# Patient Record
Sex: Female | Born: 1935
Health system: Southern US, Community
[De-identification: ages and names within clinical notes are randomized; demographics above are authoritative.]

## PROBLEM LIST (undated history)

## (undated) DIAGNOSIS — Z8489 Family history of other specified conditions: Secondary | ICD-10-CM

## (undated) DIAGNOSIS — K219 Gastro-esophageal reflux disease without esophagitis: Secondary | ICD-10-CM

## (undated) DIAGNOSIS — C50919 Malignant neoplasm of unspecified site of unspecified female breast: Secondary | ICD-10-CM

## (undated) DIAGNOSIS — Z923 Personal history of irradiation: Secondary | ICD-10-CM

## (undated) DIAGNOSIS — Z9221 Personal history of antineoplastic chemotherapy: Secondary | ICD-10-CM

## (undated) DIAGNOSIS — R011 Cardiac murmur, unspecified: Secondary | ICD-10-CM

## (undated) DIAGNOSIS — M5416 Radiculopathy, lumbar region: Secondary | ICD-10-CM

## (undated) DIAGNOSIS — M1990 Unspecified osteoarthritis, unspecified site: Secondary | ICD-10-CM

## (undated) DIAGNOSIS — M48061 Spinal stenosis, lumbar region without neurogenic claudication: Secondary | ICD-10-CM

## (undated) HISTORY — PX: BACK SURGERY: SHX140

## (undated) HISTORY — PX: BREAST SURGERY: SHX581

## (undated) HISTORY — PX: FRACTURE SURGERY: SHX138

## (undated) HISTORY — PX: EYE SURGERY: SHX253

## (undated) HISTORY — PX: ABDOMINAL HYSTERECTOMY: SHX81

## (undated) HISTORY — PX: COLONOSCOPY: SHX174

---

## 1999-12-16 HISTORY — PX: TUMOR EXCISION: SHX421

## 2005-04-17 ENCOUNTER — Ambulatory Visit: Payer: Self-pay | Admitting: Family Medicine

## 2005-12-15 DIAGNOSIS — Z923 Personal history of irradiation: Secondary | ICD-10-CM

## 2005-12-15 DIAGNOSIS — C50919 Malignant neoplasm of unspecified site of unspecified female breast: Secondary | ICD-10-CM

## 2005-12-15 HISTORY — DX: Malignant neoplasm of unspecified site of unspecified female breast: C50.919

## 2005-12-15 HISTORY — PX: BREAST LUMPECTOMY: SHX2

## 2005-12-15 HISTORY — DX: Personal history of irradiation: Z92.3

## 2006-04-22 ENCOUNTER — Ambulatory Visit: Payer: Self-pay | Admitting: Family Medicine

## 2006-04-24 ENCOUNTER — Ambulatory Visit: Payer: Self-pay | Admitting: Family Medicine

## 2006-06-15 ENCOUNTER — Other Ambulatory Visit: Payer: Self-pay

## 2006-06-15 ENCOUNTER — Ambulatory Visit: Payer: Self-pay | Admitting: General Surgery

## 2006-06-18 ENCOUNTER — Ambulatory Visit: Payer: Self-pay | Admitting: General Surgery

## 2006-07-02 ENCOUNTER — Ambulatory Visit: Payer: Self-pay | Admitting: General Surgery

## 2006-07-07 ENCOUNTER — Ambulatory Visit: Payer: Self-pay | Admitting: General Surgery

## 2006-07-27 ENCOUNTER — Ambulatory Visit: Payer: Self-pay | Admitting: Oncology

## 2006-08-21 ENCOUNTER — Ambulatory Visit: Payer: Self-pay | Admitting: Oncology

## 2006-09-14 ENCOUNTER — Ambulatory Visit: Payer: Self-pay | Admitting: Oncology

## 2006-12-23 ENCOUNTER — Ambulatory Visit: Payer: Self-pay | Admitting: General Surgery

## 2007-02-22 ENCOUNTER — Ambulatory Visit: Payer: Self-pay | Admitting: Oncology

## 2007-03-16 ENCOUNTER — Ambulatory Visit: Payer: Self-pay | Admitting: Oncology

## 2007-06-16 ENCOUNTER — Ambulatory Visit: Payer: Self-pay | Admitting: General Surgery

## 2007-08-16 ENCOUNTER — Ambulatory Visit: Payer: Self-pay | Admitting: Oncology

## 2007-08-30 ENCOUNTER — Ambulatory Visit: Payer: Self-pay | Admitting: Oncology

## 2007-09-15 ENCOUNTER — Ambulatory Visit: Payer: Self-pay | Admitting: Oncology

## 2007-10-16 ENCOUNTER — Ambulatory Visit: Payer: Self-pay | Admitting: Oncology

## 2007-11-09 ENCOUNTER — Emergency Department: Payer: Self-pay | Admitting: Unknown Physician Specialty

## 2007-12-16 ENCOUNTER — Ambulatory Visit: Payer: Self-pay | Admitting: Oncology

## 2007-12-22 ENCOUNTER — Ambulatory Visit: Payer: Self-pay | Admitting: General Surgery

## 2008-01-06 ENCOUNTER — Ambulatory Visit: Payer: Self-pay | Admitting: Urology

## 2008-02-13 ENCOUNTER — Ambulatory Visit: Payer: Self-pay | Admitting: Oncology

## 2008-03-15 ENCOUNTER — Ambulatory Visit: Payer: Self-pay | Admitting: Oncology

## 2008-05-15 ENCOUNTER — Ambulatory Visit: Payer: Self-pay | Admitting: Oncology

## 2008-06-14 ENCOUNTER — Ambulatory Visit: Payer: Self-pay | Admitting: General Surgery

## 2008-06-14 ENCOUNTER — Ambulatory Visit: Payer: Self-pay | Admitting: Oncology

## 2008-08-15 ENCOUNTER — Ambulatory Visit: Payer: Self-pay | Admitting: Oncology

## 2008-08-29 ENCOUNTER — Ambulatory Visit: Payer: Self-pay | Admitting: Oncology

## 2008-09-14 ENCOUNTER — Ambulatory Visit: Payer: Self-pay | Admitting: Oncology

## 2008-12-18 ENCOUNTER — Ambulatory Visit: Payer: Self-pay | Admitting: General Surgery

## 2009-06-20 ENCOUNTER — Ambulatory Visit: Payer: Self-pay | Admitting: General Surgery

## 2009-10-31 ENCOUNTER — Ambulatory Visit: Payer: Self-pay | Admitting: Ophthalmology

## 2010-02-26 ENCOUNTER — Ambulatory Visit: Payer: Self-pay | Admitting: Gastroenterology

## 2010-06-26 ENCOUNTER — Ambulatory Visit: Payer: Self-pay | Admitting: General Surgery

## 2011-07-02 ENCOUNTER — Ambulatory Visit: Payer: Self-pay | Admitting: General Surgery

## 2012-07-06 ENCOUNTER — Other Ambulatory Visit: Payer: Self-pay | Admitting: Neurosurgery

## 2012-07-06 DIAGNOSIS — M47816 Spondylosis without myelopathy or radiculopathy, lumbar region: Secondary | ICD-10-CM

## 2012-07-07 ENCOUNTER — Ambulatory Visit
Admission: RE | Admit: 2012-07-07 | Discharge: 2012-07-07 | Disposition: A | Discharge status: Home / Self Care | Payer: Commercial Managed Care - PPO | Source: Ambulatory Visit | Attending: Neurosurgery | Admitting: Neurosurgery

## 2012-07-07 DIAGNOSIS — M47816 Spondylosis without myelopathy or radiculopathy, lumbar region: Secondary | ICD-10-CM

## 2012-07-07 MED ORDER — GADOBENATE DIMEGLUMINE 529 MG/ML IV SOLN
17.0000 mL | Freq: Once | INTRAVENOUS | Status: AC | PRN
  Administered 2012-07-07: 17 mL via INTRAVENOUS

## 2012-09-15 ENCOUNTER — Ambulatory Visit: Payer: Self-pay | Admitting: Ophthalmology

## 2012-10-27 ENCOUNTER — Ambulatory Visit: Payer: Self-pay | Admitting: Family Medicine

## 2013-06-21 ENCOUNTER — Ambulatory Visit: Payer: Self-pay | Admitting: Family Medicine

## 2013-10-26 ENCOUNTER — Ambulatory Visit: Payer: Self-pay | Admitting: Orthopedic Surgery

## 2013-10-27 ENCOUNTER — Ambulatory Visit: Payer: Self-pay | Admitting: Orthopedic Surgery

## 2013-11-29 ENCOUNTER — Ambulatory Visit: Payer: Self-pay | Admitting: Family Medicine

## 2013-12-01 ENCOUNTER — Ambulatory Visit: Payer: Self-pay | Admitting: Family Medicine

## 2015-01-17 ENCOUNTER — Ambulatory Visit: Payer: Self-pay | Admitting: Orthopedic Surgery

## 2015-01-30 DIAGNOSIS — M48062 Spinal stenosis, lumbar region with neurogenic claudication: Secondary | ICD-10-CM | POA: Insufficient documentation

## 2015-02-21 ENCOUNTER — Ambulatory Visit: Payer: Self-pay | Admitting: Family Medicine

## 2015-02-23 ENCOUNTER — Ambulatory Visit: Payer: Self-pay | Admitting: Family Medicine

## 2015-04-06 NOTE — Op Note (Signed)
PATIENT NAME:  Megan Santana, Megan Santana MR#:  [id number] DATE OF BIRTH:  04-Jan-1934  DATE OF PROCEDURE:  10/27/2013  PREOPERATIVE DIAGNOSIS: Left distal radius fracture, comminuted, displaced.   POSTOPERATIVE DIAGNOSIS: Left distal radius fracture, comminuted, displaced.   PROCEDURE: Open reduction and internal fixation, left distal radius.   ANESTHESIA: General.   SURGEON: Hessie Knows, M.D.   DESCRIPTION OF PROCEDURE: The patient was brought to the Operating Room and, after adequate anesthesia was obtained, the left arm was prepped and draped in the usual sterile fashion. A tourniquet applied to the upper arm. After appropriate patient identification and timeout procedures were completed, the tourniquet was raised to 250 mmHg and finger trap traction applied with approximately 10 pounds of traction.   An incision was made over the FCR tendon. The FCR tendon sheath was incised, and the tendon retracted radially, with the radial artery protected. The deep fascia was then incised and the pronator elevated off the distal fragment and distal shaft. With traction, fairly good alignment was obtained; with some ulnar deviation and volar flexion, near-anatomic alignment was obtained. A DVR standard short plate was then applied to the radius, with three proximal screw holes placed after it was adjusted appropriately. Distal peg holes were filled using standard technique, drilling, measuring and placing the smooth pegs. Care was taken under fluoroscopy to make certain that there was no penetration into the joint.   After all peg holes had been filled, the traction was released. The wrist was placed through a range of motion, and there was stable fixation, with restoration of length, radial inclination and some volar tilt. The wound was irrigated and tourniquet let down. The wound was closed with 3-0 Vicryl subcutaneously and 4-0 nylon for the skin. Xeroform, 4 x 4's, Webril and a volar splint were applied, followed  by an Ace wrap.   TOURNIQUET TIME: 33 minutes at 250 mmHg.  COMPLICATIONS: There were no complications.   SPECIMEN: No specimen.   IMPLANT: [employer] short standard left DVR plate.    ____________________________ Laurene Footman, MD mjm:cg D: 10/27/2013 18:50:04 ET T: 10/28/2013 03:41:26 ET JOB#: [id number]  cc: Laurene Footman, MD, <Dictator> Laurene Footman MD ELECTRONICALLY SIGNED 10/28/2013 8:01

## 2016-12-15 DIAGNOSIS — Z9221 Personal history of antineoplastic chemotherapy: Secondary | ICD-10-CM

## 2016-12-15 HISTORY — PX: BREAST EXCISIONAL BIOPSY: SUR124

## 2016-12-15 HISTORY — DX: Personal history of antineoplastic chemotherapy: Z92.21

## 2017-01-04 ENCOUNTER — Encounter: Payer: Self-pay | Admitting: Gynecology

## 2017-01-04 ENCOUNTER — Ambulatory Visit (INDEPENDENT_AMBULATORY_CARE_PROVIDER_SITE_OTHER): Payer: Medicare PPO

## 2017-01-04 ENCOUNTER — Ambulatory Visit
Admission: EM | Admit: 2017-01-04 | Discharge: 2017-01-04 | Disposition: A | Discharge status: Home / Self Care | Payer: Medicare PPO | Attending: Family Medicine | Admitting: Family Medicine

## 2017-01-04 DIAGNOSIS — S40011A Contusion of right shoulder, initial encounter: Secondary | ICD-10-CM

## 2017-01-04 DIAGNOSIS — Y92099 Unspecified place in other non-institutional residence as the place of occurrence of the external cause: Secondary | ICD-10-CM

## 2017-01-04 DIAGNOSIS — Y92009 Unspecified place in unspecified non-institutional (private) residence as the place of occurrence of the external cause: Principal | ICD-10-CM

## 2017-01-04 DIAGNOSIS — S40012A Contusion of left shoulder, initial encounter: Secondary | ICD-10-CM | POA: Diagnosis not present

## 2017-01-04 DIAGNOSIS — S40022A Contusion of left upper arm, initial encounter: Secondary | ICD-10-CM | POA: Diagnosis not present

## 2017-01-04 DIAGNOSIS — W19XXXA Unspecified fall, initial encounter: Secondary | ICD-10-CM | POA: Diagnosis not present

## 2017-01-04 DIAGNOSIS — S40021A Contusion of right upper arm, initial encounter: Secondary | ICD-10-CM | POA: Diagnosis not present

## 2017-01-04 HISTORY — DX: Spinal stenosis, lumbar region without neurogenic claudication: M48.061

## 2017-01-04 HISTORY — DX: Radiculopathy, lumbar region: M54.16

## 2017-01-04 MED ORDER — KETOROLAC TROMETHAMINE 60 MG/2ML IM SOLN
30.0000 mg | Freq: Once | INTRAMUSCULAR | Status: AC
  Administered 2017-01-04: 30 mg via INTRAMUSCULAR

## 2017-01-04 MED ORDER — MELOXICAM 7.5 MG PO TABS: 7.5000 mg | ORAL_TABLET | Freq: Every day | ORAL | 0 refills | Status: DC

## 2017-01-04 MED ORDER — OXYCODONE-ACETAMINOPHEN 5-325 MG PO TABS: 1.0000 | ORAL_TABLET | Freq: Three times a day (TID) | ORAL | 0 refills | Status: DC | PRN

## 2017-01-04 NOTE — ED Triage Notes (Signed)
Per patient slipped on ice this morning and fell and injury right arm/shoulder.

## 2017-01-04 NOTE — ED Provider Notes (Signed)
[facility]    CSN: [id number] Arrival date & time: 01/04/17  1339     History   Chief Complaint Chief Complaint  Patient presents with  . Fall    HPI Megan Santana is a 83 y.o. female.   Patient's here because of fall. She states she was unaware to church when she stopped the get her newspaper that was at the top of her driveway. She states that she thought she was being very careful still want up falling and landing on her right shoulder right elbow. She reports excruciating pain in the right shoulder and elbow she is unable to move the arm and elbow unable to raise above her head. Even during examination she reports extreme amount of pain and discomfort and inability to really follow my instructions. Should be noted that she had a fracture of the left wrist about 3-4 years ago when she got on a ladder and fell fracturing her left wrist. She denies any significant medical problems. She's had back surgery breast lumpectomy breast surgery and eye surgery but no chronic medical problems. She has no known allergies and no family medical history pertinent to today's visit   The history is provided by the patient. No language interpreter was used.  Fall  This is a new problem. The current episode started 3 to 5 hours ago. The problem has been gradually worsening. Pertinent negatives include no chest pain, no abdominal pain, no headaches and no shortness of breath. Nothing aggravates the symptoms. Nothing relieves the symptoms. She has tried nothing for the symptoms. The treatment provided no relief.    Past Medical History:  Diagnosis Date  . Lumbar radiculitis   . Lumbar stenosis     There are no active problems to display for this patient.   Past Surgical History:  Procedure Laterality Date  . BACK SURGERY    . BREAST LUMPECTOMY    . BREAST SURGERY Right    masectomy  . EYE SURGERY      OB History    No data available       Home Medications     Prior to Admission medications   Medication Sig Start Date End Date Taking? Authorizing Provider  aspirin EC 81 MG tablet Take 81 mg by mouth daily.   Yes Historical Provider, MD  magnesium citrate SOLN Take 1 Bottle by mouth once.   Yes Historical Provider, MD  Multiple Vitamin (MULTIVITAMIN) tablet Take 1 tablet by mouth daily.   Yes Historical Provider, MD  Potassium 99 MG TABS Take by mouth.   Yes Historical Provider, MD  vitamin C (ASCORBIC ACID) 500 MG tablet Take 500 mg by mouth daily.   Yes Historical Provider, MD  meloxicam (MOBIC) 7.5 MG tablet Take 1 tablet (7.5 mg total) by mouth daily. 01/04/17   Frederich Cha, MD  oxyCODONE-acetaminophen (ROXICET) 5-325 MG tablet Take 1 tablet by mouth every 8 (eight) hours as needed for severe pain. 01/04/17   Frederich Cha, MD    Family History No family history on file.  Social History Social History  Substance Use Topics  . Smoking status: Never Smoker  . Smokeless tobacco: Never Used  . Alcohol use No     Allergies   Patient has no known allergies.   Review of Systems Review of Systems  Respiratory: Negative for shortness of breath.   Cardiovascular: Negative for chest pain.  Gastrointestinal: Negative for abdominal pain.  Musculoskeletal: Positive for joint swelling and myalgias.  Neurological:  Negative for headaches.  All other systems reviewed and are negative.    Physical Exam Triage Vital Signs ED Triage Vitals  Enc Vitals Group     BP 01/04/17 1433 (!) 153/76     Pulse Rate 01/04/17 1433 80     Resp 01/04/17 1433 16     Temp 01/04/17 1433 98.4 F (36.9 C)     Temp Source 01/04/17 1433 Oral     SpO2 01/04/17 1433 99 %     Weight 01/04/17 1436 190 lb (86.2 kg)     Height 01/04/17 1436 6\' 3"  (1.905 m)     Head Circumference --      Peak Flow --      Pain Score 01/04/17 1446 8     Pain Loc --      Pain Edu? --      Excl. in [place]? --    No data found.   Updated Vital Signs BP (!) 153/76 (BP Location: Left  Arm)   Pulse 80   Temp 98.4 F (36.9 C) (Oral)   Resp 16   Ht 6\' 3"  (1.905 m)   Wt 190 lb (86.2 kg)   SpO2 99%   BMI 23.75 kg/m   Visual Acuity Right Eye Distance:   Left Eye Distance:   Bilateral Distance:    Right Eye Near:   Left Eye Near:    Bilateral Near:     Physical Exam  Constitutional: She is oriented to person, place, and time. She appears well-developed and well-nourished.  HENT:  Head: Normocephalic and atraumatic.  Eyes: Pupils are equal, round, and reactive to light.  Neck: Normal range of motion. Neck supple.  Pulmonary/Chest: Effort normal.  Musculoskeletal: She exhibits tenderness.       Right shoulder: She exhibits decreased range of motion, tenderness and swelling.       Right elbow: She exhibits decreased range of motion. Tenderness found.  Should be noted the patient examination was limited she is unable to really do any good range of motion of the right arm raised above her head to the inverted beer can test or anything of that level. Any touching of the arm causes marked amount discomfort and pain by her.  Neurological: She is alert and oriented to person, place, and time. No cranial nerve deficit.  Skin: Skin is warm.  Psychiatric: She has a normal mood and affect.  Vitals reviewed.    UC Treatments / Results  Labs (all labs ordered are listed, but only abnormal results are displayed) Labs Reviewed - No data to display  EKG  EKG Interpretation None       Radiology Dg Shoulder Right  Result Date: 01/04/2017 CLINICAL DATA:  Right shoulder and right elbow pain after a fall on ice today. Limited range of motion. Initial encounter. EXAM: RIGHT SHOULDER - 2+ VIEW COMPARISON:  None. FINDINGS: There is no evidence of fracture or dislocation. There is no evidence of arthropathy or other focal bone abnormality. Soft tissues are unremarkable. IMPRESSION: Negative. Electronically Signed   By: Logan Bores M.D.   On: 01/04/2017 16:37   Dg Elbow  Complete Right  Result Date: 01/04/2017 CLINICAL DATA:  Right shoulder and elbow pain after a fall on ice today. Initial encounter. EXAM: RIGHT ELBOW - COMPLETE 3+ VIEW COMPARISON:  None. FINDINGS: There is no evidence of acute fracture, dislocation, or elbow joint effusion. Apparent mild cortical step-off involving the posterior margin of the olecranon on a single oblique image is felt  to be projectional, not confirmed on other images. A small well corticated ossicle is noted adjacent to the coronoid process. No focal osseous lesion or soft tissue abnormality is seen. IMPRESSION: No evidence of acute osseous abnormality. Electronically Signed   By: Logan Bores M.D.   On: 01/04/2017 16:43    Procedures Procedures (including critical care time)  Medications Ordered in UC Medications  ketorolac (TORADOL) injection 30 mg (30 mg Intramuscular Given 01/04/17 1702)     Initial Impression / Assessment and Plan / UC Course  I have reviewed the triage vital signs and the nursing notes.  Pertinent labs & imaging results that were available during my care of the patient were reviewed by me and considered in my medical decision making (see chart for details).    Patient was informed that both x-rays were negative of the elbow on the shoulder. Recommend sling she declined one here because she states she had one at home but recommended that she use a sling and ice for about 48 hours and instructed using the shoulder to prevent it from getting frozen. Because of mild pain and tenderness unable to do a a complete examination of the right shoulder 7 is not improving in a week she needs to see her PCP orthopedic of her choice. She be noted the patient had a left wrist fracture a couple years ago that required pinning. That time they used Percocet for pain we'll give her 5 days worth of Percocet No. 15 and placed on Mobic 7.5 mg 1 tablet a day. Because of mild pain she was in 30 mg of Toradol was administered here  as well.  Final Clinical Impressions(s) / UC Diagnoses   Final diagnoses:  Fall in home, initial encounter  Contusion of multiple sites of left shoulder and upper arm, initial encounter    New Prescriptions Discharge Medication List as of 01/04/2017  5:14 PM    START taking these medications   Details  meloxicam (MOBIC) 7.5 MG tablet Take 1 tablet (7.5 mg total) by mouth daily., Starting Sun 01/04/2017, Normal    oxyCODONE-acetaminophen (ROXICET) 5-325 MG tablet Take 1 tablet by mouth every 8 (eight) hours as needed for severe pain., Starting Sun 01/04/2017, Normal       Note: This dictation was prepared with Dragon dictation along with smaller phrase technology. Any transcriptional errors that result from this process are unintentional.    Frederich Cha, MD 01/04/17 [phone]

## 2017-01-22 ENCOUNTER — Other Ambulatory Visit: Payer: Self-pay | Admitting: Family Medicine

## 2017-01-22 DIAGNOSIS — Z1231 Encounter for screening mammogram for malignant neoplasm of breast: Secondary | ICD-10-CM

## 2017-03-04 ENCOUNTER — Other Ambulatory Visit: Payer: Self-pay | Admitting: Family Medicine

## 2017-03-04 ENCOUNTER — Ambulatory Visit
Admission: RE | Admit: 2017-03-04 | Discharge: 2017-03-04 | Disposition: A | Discharge status: Home / Self Care | Payer: Medicare PPO | Source: Ambulatory Visit | Attending: Family Medicine | Admitting: Family Medicine

## 2017-03-04 DIAGNOSIS — R928 Other abnormal and inconclusive findings on diagnostic imaging of breast: Secondary | ICD-10-CM | POA: Diagnosis not present

## 2017-03-04 DIAGNOSIS — N6320 Unspecified lump in the left breast, unspecified quadrant: Secondary | ICD-10-CM

## 2017-03-04 DIAGNOSIS — Z1231 Encounter for screening mammogram for malignant neoplasm of breast: Secondary | ICD-10-CM | POA: Diagnosis not present

## 2017-03-04 DIAGNOSIS — N6310 Unspecified lump in the right breast, unspecified quadrant: Secondary | ICD-10-CM

## 2017-03-04 HISTORY — DX: Malignant neoplasm of unspecified site of unspecified female breast: C50.919

## 2017-03-17 ENCOUNTER — Ambulatory Visit
Admission: RE | Admit: 2017-03-17 | Discharge: 2017-03-17 | Disposition: A | Discharge status: Home / Self Care | Payer: Medicare PPO | Source: Ambulatory Visit | Attending: Family Medicine | Admitting: Family Medicine

## 2017-03-17 DIAGNOSIS — N6313 Unspecified lump in the right breast, lower outer quadrant: Secondary | ICD-10-CM | POA: Insufficient documentation

## 2017-03-17 DIAGNOSIS — N6321 Unspecified lump in the left breast, upper outer quadrant: Secondary | ICD-10-CM | POA: Insufficient documentation

## 2017-03-17 DIAGNOSIS — N6320 Unspecified lump in the left breast, unspecified quadrant: Secondary | ICD-10-CM

## 2017-03-17 DIAGNOSIS — R928 Other abnormal and inconclusive findings on diagnostic imaging of breast: Secondary | ICD-10-CM | POA: Diagnosis present

## 2017-03-17 DIAGNOSIS — N6310 Unspecified lump in the right breast, unspecified quadrant: Secondary | ICD-10-CM

## 2017-03-18 ENCOUNTER — Other Ambulatory Visit: Payer: Self-pay | Admitting: Family Medicine

## 2017-03-18 DIAGNOSIS — N6320 Unspecified lump in the left breast, unspecified quadrant: Secondary | ICD-10-CM

## 2017-03-18 DIAGNOSIS — R928 Other abnormal and inconclusive findings on diagnostic imaging of breast: Secondary | ICD-10-CM

## 2017-03-19 ENCOUNTER — Other Ambulatory Visit: Payer: Self-pay | Admitting: Family Medicine

## 2017-03-19 DIAGNOSIS — R928 Other abnormal and inconclusive findings on diagnostic imaging of breast: Secondary | ICD-10-CM

## 2017-03-19 DIAGNOSIS — N6320 Unspecified lump in the left breast, unspecified quadrant: Secondary | ICD-10-CM

## 2017-03-19 DIAGNOSIS — N6310 Unspecified lump in the right breast, unspecified quadrant: Secondary | ICD-10-CM

## 2017-03-31 ENCOUNTER — Ambulatory Visit
Admission: RE | Admit: 2017-03-31 | Discharge: 2017-03-31 | Disposition: A | Discharge status: Home / Self Care | Payer: Medicare PPO | Source: Ambulatory Visit | Attending: Family Medicine | Admitting: Family Medicine

## 2017-03-31 DIAGNOSIS — N6310 Unspecified lump in the right breast, unspecified quadrant: Secondary | ICD-10-CM

## 2017-03-31 DIAGNOSIS — R928 Other abnormal and inconclusive findings on diagnostic imaging of breast: Secondary | ICD-10-CM

## 2017-03-31 DIAGNOSIS — N6031 Fibrosclerosis of right breast: Secondary | ICD-10-CM | POA: Insufficient documentation

## 2017-03-31 DIAGNOSIS — C50412 Malignant neoplasm of upper-outer quadrant of left female breast: Secondary | ICD-10-CM | POA: Insufficient documentation

## 2017-03-31 DIAGNOSIS — N6320 Unspecified lump in the left breast, unspecified quadrant: Secondary | ICD-10-CM

## 2017-03-31 HISTORY — PX: BREAST BIOPSY: SHX20

## 2017-04-07 ENCOUNTER — Encounter: Payer: Self-pay | Admitting: *Deleted

## 2017-04-07 NOTE — Progress Notes (Signed)
  Oncology Nurse Navigator Documentation  Navigator Location: [facility] (04/07/17 1000) Referral date to RadOnc/MedOnc: 04/10/17 (04/07/17 1000) )Navigator Encounter Type: Introductory phone call (04/07/17 1000)   Abnormal Finding Date: 03/17/17 (04/07/17 1000) Confirmed Diagnosis Date: 04/01/17 (04/07/17 1000)                   Barriers/Navigation Needs: Education;Coordination of Care (04/07/17 1000) Education: Understanding Cancer/ Treatment Options (04/07/17 1000) Interventions: Coordination of Care (04/07/17 1000)   Coordination of Care: Appts (04/07/17 1000)        Acuity: Level 2 (04/07/17 1000)    Called and spoke with patient yesterday and today.  She had been notified by the breast center of her results yesterday afternoon.  She had right breast cancer 11 years ago.  States she would like to see Dr. Bary Castilla or Dr Adonis Huguenin for surgical consult, whom ever could get her in the fastest.  She also saw Dr. Grayland Ormond in the past.  I have scheduled her to see Dr. Grayland Ormond for oncology consult on Friday, 04/10/17 @ 8:30 and Dr. Bary Castilla for surgical consult on Apr 16, 2017 @ 11:30.  She is to arrive 30 minutes early to complete paperwork.  She is to take a photo ID, all her meds , and her co-pay with her to the appointment.  She is interested in getting more educational literature.  Will take educational literature to her on Friday.  She is to call if she has any questions or needs.     Time Spent with Patient: 60 (04/07/17 1000)

## 2017-04-08 DIAGNOSIS — C50412 Malignant neoplasm of upper-outer quadrant of left female breast: Secondary | ICD-10-CM | POA: Insufficient documentation

## 2017-04-08 DIAGNOSIS — Z171 Estrogen receptor negative status [ER-]: Secondary | ICD-10-CM

## 2017-04-08 LAB — SURGICAL PATHOLOGY

## 2017-04-08 NOTE — Progress Notes (Signed)
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  INTERVAL HISTORY: Patient is an 83 year old female who was last evaluated in clinic in September 2009. She has a history of stage IA ER/PR positive right breast cancer. Recently she had an abnormal mammogram and biopsy which revealed the above stated triple negative breast cancer. She currently feels well and is asymptomatic. She has no neurologic complaints. She denies any recent fevers or illnesses. She has good appetite and denies weight loss. She has no chest pain or shortness of breath. She denies any nausea, vomiting, constipation, or diarrhea. She has no urinary complaints. Patient offers no specific complaints today.  REVIEW OF SYSTEMS:   Review of Systems  Constitutional: Negative.  Negative for fever, malaise/fatigue and weight loss.  Respiratory: Negative.  Negative for cough and shortness of breath.   Cardiovascular: Negative.  Negative for chest pain and leg swelling.  Gastrointestinal: Negative.  Negative for abdominal pain.  Genitourinary: Negative.   Musculoskeletal: Negative.   Skin: Negative.  Negative for rash.  Neurological: Negative.  Negative for sensory change and weakness.  Psychiatric/Behavioral: The patient is nervous/anxious.     As per HPI. Otherwise, a complete review of systems is negative.  PAST MEDICAL HISTORY: Past Medical History:  Diagnosis Date  . Breast cancer [facility]) 2007   right lumpectomy  . Lumbar radiculitis   . Lumbar stenosis     PAST SURGICAL HISTORY: Past Surgical History:  Procedure Laterality Date  . ABDOMINAL HYSTERECTOMY     total  . BACK SURGERY    . BREAST BIOPSY Bilateral 03/31/2017   bilat u/s core path  pending  . BREAST LUMPECTOMY Right 2007  . BREAST SURGERY Right    masectomy  . EYE SURGERY      FAMILY HISTORY: Family History  Problem Relation Age of Onset  . Breast cancer Sister 55  . Breast cancer Mother   . Lung cancer Mother   . Lung cancer Father   . Heart attack Brother     ADVANCED DIRECTIVES (Y/N):  N  HEALTH MAINTENANCE: Social History  Substance Use Topics  . Smoking status: Never Smoker  . Smokeless tobacco: Never Used  . Alcohol use No     Colonoscopy:  PAP:  Bone density:  Lipid panel:  Allergies  Allergen Reactions  . Oxycodone Other (See Comments)    Can't sleep, feels bad when taking.    Current Outpatient Prescriptions  Medication Sig Dispense Refill  . aspirin EC 81 MG tablet Take 81 mg by mouth daily.    . Calcium 600-200 MG-UNIT tablet Take 1 tablet by mouth daily.    . Cholecalciferol (VITAMIN D3 PO) Take by mouth.    . magnesium citrate SOLN Take 1 Bottle by mouth once.    . Multiple Vitamin (MULTIVITAMIN) tablet Take 1 tablet by mouth daily.    . Potassium 99 MG TABS Take by mouth.    . vitamin C (ASCORBIC ACID) 500 MG tablet Take 500 mg by mouth daily.    . meloxicam (MOBIC) 7.5 MG tablet Take 1 tablet (7.5 mg total) by mouth daily. (Patient not taking: Reported on 04/10/2017) 30 tablet 0  . oxyCODONE-acetaminophen (ROXICET) 5-325 MG tablet Take 1 tablet by mouth every 8 (eight) hours  as needed for severe pain. (Patient not taking: Reported on 04/10/2017) 15 tablet 0   No current facility-administered medications for this visit.     OBJECTIVE: Vitals:   04/10/17 0904  BP: (!) 148/75  Pulse: 70  Resp: 18  Temp: 97.6 F (36.4 C)     Body mass index is 33.35 kg/m.    ECOG FS:0 - Asymptomatic  General: Well-developed, well-nourished, no acute distress. Eyes: Pink conjunctiva, anicteric sclera. HEENT: Normocephalic, moist mucous membranes, clear oropharnyx. Breasts: Patient declined breast exam today. Lungs: Clear to  auscultation bilaterally. Heart: Regular rate and rhythm. No rubs, murmurs, or gallops. Abdomen: Soft, nontender, nondistended. No organomegaly noted, normoactive bowel sounds. Musculoskeletal: No edema, cyanosis, or clubbing. Neuro: Alert, answering all questions appropriately. Cranial nerves grossly intact. Skin: No rashes or petechiae noted. Psych: Normal affect. Lymphatics: No cervical, calvicular, axillary or inguinal LAD.   LAB RESULTS:  No results found for: NA, K, CL, CO2, GLUCOSE, BUN, CREATININE, CALCIUM, PROT, ALBUMIN, AST, ALT, ALKPHOS, BILITOT, GFRNONAA, GFRAA  No results found for: WBC, NEUTROABS, HGB, HCT, MCV, PLT   STUDIES: US Breast Ltd Uni Left Inc Axilla  Result Date: 03/17/2017 CLINICAL DATA:  The patient was called back from screening mammography due to bilateral breast masses. Previous right lumpectomy and MammoSite. EXAM: 2D DIGITAL DIAGNOSTIC BILATERAL MAMMOGRAM WITH CAD AND ADJUNCT TOMO ULTRASOUND BILATERAL BREAST COMPARISON:  Previous exam(s). ACR Breast Density Category b: There are scattered areas of fibroglandular density. FINDINGS: There is a rounded masslike region developing along the anteromedial border of the MammoSite on the right. On 3D images, there appears to be some central low density raising the possibility of fat necrosis but the findings are indeterminate. There is a spiculated mass in the left breast measuring up to 17 mm located laterally and superiorly. No other suspicious interval changes. Mammographic images were processed with CAD. On physical exam, no suspicious lumps identified. Targeted ultrasound is performed, showing the MammoSite at 8:30, 7 cm from the nipple. Along the anteromedial aspect of the MammoSite is a masslike projection measuring 1.3 x 1.2 x 1.0 cm, correlating with mammographic changes. No axillary adenopathy on the right. On the left, there are 2 adjacent masses at 1 o'clock, 5 cm from the nipple. The larger measures 0.9 x 1.2 x  1.5 cm. 4 mm away is a second smaller mass, likely a satellite lesion measuring 4 by 6 mm in size. No axillary adenopathy on the left. IMPRESSION: Highly suspicious new mass in the left breast with an adjacent satellite lesion. Developing masslike projection off the anteromedial aspect of the MammoSite on the right. This may represent fat necrosis but is not. RECOMMENDATION: Recommend ultrasound-guided biopsy of the dominant new left breast mass. Recommend ultrasound-guided biopsy of the masslike projection off the anteromedial border of the right MammoSite. I have discussed the findings and recommendations with the patient. Results were also provided in writing at the conclusion of the visit. If applicable, a reminder letter will be sent to the patient regarding the next appointment. BI-RADS CATEGORY  5: Highly suggestive of malignancy. Electronically Signed   By: Dorise Bullion III M.D   On: 03/17/2017 15:54   US Breast Ltd Uni Right Inc Axilla  Result Date: 03/17/2017 CLINICAL DATA:  The patient was called back from screening mammography due to bilateral breast masses. Previous right lumpectomy and MammoSite. EXAM: 2D DIGITAL DIAGNOSTIC BILATERAL MAMMOGRAM WITH CAD AND ADJUNCT TOMO ULTRASOUND BILATERAL BREAST COMPARISON:  Previous exam(s). ACR Breast Density Category b: There are  scattered areas of fibroglandular density. FINDINGS: There is a rounded masslike region developing along the anteromedial border of the MammoSite on the right. On 3D images, there appears to be some central low density raising the possibility of fat necrosis but the findings are indeterminate. There is a spiculated mass in the left breast measuring up to 17 mm located laterally and superiorly. No other suspicious interval changes. Mammographic images were processed with CAD. On physical exam, no suspicious lumps identified. Targeted ultrasound is performed, showing the MammoSite at 8:30, 7 cm from the nipple. Along the anteromedial  aspect of the MammoSite is a masslike projection measuring 1.3 x 1.2 x 1.0 cm, correlating with mammographic changes. No axillary adenopathy on the right. On the left, there are 2 adjacent masses at 1 o'clock, 5 cm from the nipple. The larger measures 0.9 x 1.2 x 1.5 cm. 4 mm away is a second smaller mass, likely a satellite lesion measuring 4 by 6 mm in size. No axillary adenopathy on the left. IMPRESSION: Highly suspicious new mass in the left breast with an adjacent satellite lesion. Developing masslike projection off the anteromedial aspect of the MammoSite on the right. This may represent fat necrosis but is not. RECOMMENDATION: Recommend ultrasound-guided biopsy of the dominant new left breast mass. Recommend ultrasound-guided biopsy of the masslike projection off the anteromedial border of the right MammoSite. I have discussed the findings and recommendations with the patient. Results were also provided in writing at the conclusion of the visit. If applicable, a reminder letter will be sent to the patient regarding the next appointment. BI-RADS CATEGORY  5: Highly suggestive of malignancy. Electronically Signed   By: Dorise Bullion III M.D   On: 03/17/2017 15:54   Mm Diag Breast Tomo Bilateral  Result Date: 03/17/2017 CLINICAL DATA:  The patient was called back from screening mammography due to bilateral breast masses. Previous right lumpectomy and MammoSite. EXAM: 2D DIGITAL DIAGNOSTIC BILATERAL MAMMOGRAM WITH CAD AND ADJUNCT TOMO ULTRASOUND BILATERAL BREAST COMPARISON:  Previous exam(s). ACR Breast Density Category b: There are scattered areas of fibroglandular density. FINDINGS: There is a rounded masslike region developing along the anteromedial border of the MammoSite on the right. On 3D images, there appears to be some central low density raising the possibility of fat necrosis but the findings are indeterminate. There is a spiculated mass in the left breast measuring up to 17 mm located laterally  and superiorly. No other suspicious interval changes. Mammographic images were processed with CAD. On physical exam, no suspicious lumps identified. Targeted ultrasound is performed, showing the MammoSite at 8:30, 7 cm from the nipple. Along the anteromedial aspect of the MammoSite is a masslike projection measuring 1.3 x 1.2 x 1.0 cm, correlating with mammographic changes. No axillary adenopathy on the right. On the left, there are 2 adjacent masses at 1 o'clock, 5 cm from the nipple. The larger measures 0.9 x 1.2 x 1.5 cm. 4 mm away is a second smaller mass, likely a satellite lesion measuring 4 by 6 mm in size. No axillary adenopathy on the left. IMPRESSION: Highly suspicious new mass in the left breast with an adjacent satellite lesion. Developing masslike projection off the anteromedial aspect of the MammoSite on the right. This may represent fat necrosis but is not. RECOMMENDATION: Recommend ultrasound-guided biopsy of the dominant new left breast mass. Recommend ultrasound-guided biopsy of the masslike projection off the anteromedial border of the right MammoSite. I have discussed the findings and recommendations with the patient. Results were also  provided in writing at the conclusion of the visit. If applicable, a reminder letter will be sent to the patient regarding the next appointment. BI-RADS CATEGORY  5: Highly suggestive of malignancy. Electronically Signed   By: Dorise Bullion III M.D   On: 03/17/2017 15:54   Mm Clip Placement Left  Addendum Date: 03/31/2017   ADDENDUM REPORT: 03/31/2017 10:16 ADDENDUM: The clip within the RIGHT breast, lower outer quadrant, is a RIBBON shaped clip. The clip within the LEFT breast, upper outer quadrant, appears to be a WING shaped clip. Electronically Signed   By: Franki Cabot M.D.   On: 03/31/2017 10:16   Result Date: 03/31/2017 CLINICAL DATA:  Status post ultrasound-guided biopsies of bilateral breast masses. EXAM: DIAGNOSTIC BILATERAL MAMMOGRAM POST  ULTRASOUND BIOPSY COMPARISON:  Previous exam(s). FINDINGS: Mammographic images were obtained following ultrasound guided biopsy of a left breast mass at the 1 o'clock axis followed by ultrasound-guided biopsy of the right breast masslike projection at the 8:30 o'clock axis adjacent to the MammoSite. Both clips appear appropriately positioned at the targeted biopsies sites. IMPRESSION: Bilateral clips are appropriately positioned at the targeted biopsy sites. Final Assessment: Post Procedure Mammograms for Marker Placement Electronically Signed: By: Franki Cabot M.D. On: 03/31/2017 09:36   Mm Clip Placement Right  Addendum Date: 03/31/2017   ADDENDUM REPORT: 03/31/2017 10:16 ADDENDUM: The clip within the RIGHT breast, lower outer quadrant, is a RIBBON shaped clip. The clip within the LEFT breast, upper outer quadrant, appears to be a WING shaped clip. Electronically Signed   By: Franki Cabot M.D.   On: 03/31/2017 10:16   Result Date: 03/31/2017 CLINICAL DATA:  Status post ultrasound-guided biopsies of bilateral breast masses. EXAM: DIAGNOSTIC BILATERAL MAMMOGRAM POST ULTRASOUND BIOPSY COMPARISON:  Previous exam(s). FINDINGS: Mammographic images were obtained following ultrasound guided biopsy of a left breast mass at the 1 o'clock axis followed by ultrasound-guided biopsy of the right breast masslike projection at the 8:30 o'clock axis adjacent to the MammoSite. Both clips appear appropriately positioned at the targeted biopsies sites. IMPRESSION: Bilateral clips are appropriately positioned at the targeted biopsy sites. Final Assessment: Post Procedure Mammograms for Marker Placement Electronically Signed: By: Franki Cabot M.D. 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 This is concordant with the imaging findings. The nurse navigators at [facility] will arrange appropriate follow-up for the patient, including surgical consultation. The final pathological diagnosis and recommendation were discussed with the patient by telephone on 04/06/2017. Her questions were answered. She reported some bruising at both biopsy sites, greater on the left, with no pain or palpable hematoma. Electronically Signed   By: Claudie Revering M.D.   On: 04/06/2017 12:44   Addendum Date: 03/31/2017   ADDENDUM REPORT: 03/31/2017 10:15 ADDENDUM: After further review of patient's postprocedure mammograms, the tissue site marker within the LEFT breast, upper outer quadrant, is more suggestive of a wing shaped clip. Electronically Signed   By: Franki Cabot M.D.   On: 03/31/2017 10:15   Result Date: 04/06/2017 CLINICAL DATA:  Patient with left breast mass presents today for ultrasound-guided biopsy. Patient also with a masslike projection at MammoSite for which patient also presents today for ultrasound-guided biopsy. EXAM: ULTRASOUND GUIDED BILATERAL BREAST CORE NEEDLE  BIOPSY COMPARISON:  Previous exam(s). PROCEDURE: I met with the patient and we discussed the procedure of ultrasound-guided biopsy, including benefits and alternatives. We discussed the high likelihood of a successful procedure. We discussed the risks of the procedure including infection, bleeding, tissue injury, clip migration, and inadequate sampling. Informed written consent was given. The usual time-out protocol was performed immediately prior to the procedure. 1.  Lesion quadrant: Left breast, upper outer quadrant 2.  Lesion quadrant:  Right breast, lower outer quadrant Using sterile technique and 1% Lidocaine as local anesthetic, under direct ultrasound visualization, a 12 gauge spring-loaded device was used to perform biopsy of the left breast mass at the 1 o'clock axisusing a lateral  approach. At the conclusion of the procedure, a ribbon shaped tissue marker clip was deployed into the biopsy cavity. Next, using sterile technique and 1% lidocaine as local anesthetic, under direct ultrasound visualization, a 12 gauge spring-loaded device was used to perform biopsy of the masslike projection adjacent to the MammoSite in the right breast at the 8:30 o'clock axis using a lateral approach. At the conclusion of the procedure, a ribbon shaped tissue marker clip was deployed in the biopsy cavity. Follow-up 2-view mammogram was performed and dictated separately. IMPRESSION: Ultrasound-guided biopsy of the left breast mass at the 1 o'clock axis. Ultrasound-guided biopsy of the right breast masslike projection at the 8:30 o'clock axis, adjacent to the MammoSite. No apparent complications. Electronically Signed: By: Franki Cabot M.D. 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 This is concordant with the imaging findings. The nurse navigators at [facility] will arrange appropriate follow-up for the patient, including surgical consultation. The final pathological diagnosis and recommendation were discussed with the patient by telephone on 04/06/2017. Her questions were answered. She reported some bruising at both biopsy sites, greater on the left, with no pain or palpable hematoma. Electronically Signed   By: Claudie Revering M.D.   On: 04/06/2017 12:44   Addendum Date: 03/31/2017   ADDENDUM REPORT: 03/31/2017 10:15 ADDENDUM: After further review of patient's postprocedure mammograms, the tissue site marker within the LEFT breast, upper outer quadrant, is more suggestive of a wing shaped clip. Electronically Signed   By: Franki Cabot M.D.   On: 03/31/2017 10:15   Result Date: 04/06/2017 CLINICAL DATA:  Patient with left breast mass presents today for ultrasound-guided biopsy. Patient also with a masslike projection at MammoSite for which patient also presents today for ultrasound-guided biopsy. EXAM: ULTRASOUND GUIDED BILATERAL BREAST CORE NEEDLE BIOPSY COMPARISON:  Previous exam(s). PROCEDURE: I met with the patient and we discussed the procedure of ultrasound-guided biopsy, including benefits and alternatives. We discussed the high likelihood of a successful procedure. We discussed the risks of the procedure including infection, bleeding, tissue injury, clip migration, and inadequate sampling. Informed written consent was given. The usual time-out protocol was performed immediately prior to the procedure. 1.  Lesion quadrant: Left breast, upper outer quadrant 2.  Lesion quadrant:  Right breast, lower outer quadrant Using sterile technique and 1% Lidocaine as local anesthetic, under direct ultrasound visualization, a 12 gauge spring-loaded device was used to perform biopsy of the left breast mass at the 1 o'clock axisusing a lateral approach. At the conclusion of the  procedure, a ribbon shaped tissue marker clip was deployed into the biopsy cavity. Next, using sterile technique and 1% lidocaine as local anesthetic, under direct ultrasound visualization, a 12 gauge spring-loaded device was used to perform biopsy of the masslike projection adjacent to the MammoSite in the right breast at the 8:30 o'clock axis using a lateral approach. At the conclusion of the procedure, a ribbon shaped tissue marker clip was deployed in the biopsy cavity. Follow-up 2-view mammogram was performed and dictated separately. IMPRESSION: Ultrasound-guided biopsy of the left breast mass at the 1 o'clock axis. Ultrasound-guided biopsy of the right breast masslike projection at the 8:30 o'clock axis, adjacent to the MammoSite. No apparent complications.  Electronically Signed: By: Franki Cabot M.D. 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  PLAN:    1. 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 This is likely a second primary. Patient has consultation with surgery next week and have recommended that she pursue lumpectomy. Although patient is 83 years old, she is otherwise healthy and have recommended adjuvant chemotherapy. We will likely use 4 cycles of Taxotere and Cytoxan every 3 weeks. Patient could possibly complete treatment without a port, but she plans to further discuss this with her surgeon. Patient also indicated she may not wish to pursue adjuvant chemotherapy, but she still would likely require adjuvant XRT. An aromatase inhibitor would not be of any benefit given the ER/PR status of her tumor. Patient will return to clinic approximately 2 weeks after her lumpectomy to discuss the final pathology results and additional treatment planning. 2. 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 Oncotype score 17: Originally diagnosed in 2008. Patient completed 5 years of Arimidex in approximately August 2013. Biopsy of the right breast only revealed fat necrosis at the site of her previous MammoSite. 3. Genetic testing: Patient has a sister and mother both with breast cancer. This is also her second primary breast cancer, therefore will send for BRCA1 and 2.  Approximately 60 minutes was spent in discussion of which greater than 50% was consultation.  Patient expressed understanding and was in agreement with this plan. She also understands that She can call clinic at any time with any questions, concerns, or complaints.   Cancer Staging Primary cancer of upper  outer quadrant of left female breast [facility]) Staging form: Breast, AJCC 8th Edition - Clinical stage from 04/08/2017: Stage IB (cT1c, cN0, cM0, G3, ER: Negative, PR: Negative, HER2: Negative) - Signed by Lloyd Huger, MD on 04/08/2017   Lloyd Huger, MD   04/12/2017 10:18 PM

## 2017-04-10 ENCOUNTER — Encounter: Payer: Self-pay | Admitting: Oncology

## 2017-04-10 ENCOUNTER — Inpatient Hospital Stay: Payer: Medicare PPO | Attending: Oncology | Admitting: Oncology

## 2017-04-10 DIAGNOSIS — Z171 Estrogen receptor negative status [ER-]: Secondary | ICD-10-CM

## 2017-04-10 DIAGNOSIS — M48061 Spinal stenosis, lumbar region without neurogenic claudication: Secondary | ICD-10-CM | POA: Diagnosis not present

## 2017-04-10 DIAGNOSIS — Z79899 Other long term (current) drug therapy: Secondary | ICD-10-CM | POA: Diagnosis not present

## 2017-04-10 DIAGNOSIS — M5416 Radiculopathy, lumbar region: Secondary | ICD-10-CM | POA: Diagnosis not present

## 2017-04-10 DIAGNOSIS — Z801 Family history of malignant neoplasm of trachea, bronchus and lung: Secondary | ICD-10-CM

## 2017-04-10 DIAGNOSIS — Z7982 Long term (current) use of aspirin: Secondary | ICD-10-CM

## 2017-04-10 DIAGNOSIS — N6031 Fibrosclerosis of right breast: Secondary | ICD-10-CM

## 2017-04-10 DIAGNOSIS — Z853 Personal history of malignant neoplasm of breast: Secondary | ICD-10-CM | POA: Diagnosis not present

## 2017-04-10 DIAGNOSIS — C50412 Malignant neoplasm of upper-outer quadrant of left female breast: Secondary | ICD-10-CM

## 2017-04-10 DIAGNOSIS — Z803 Family history of malignant neoplasm of breast: Secondary | ICD-10-CM | POA: Diagnosis not present

## 2017-04-10 DIAGNOSIS — Z9011 Acquired absence of right breast and nipple: Secondary | ICD-10-CM

## 2017-04-14 ENCOUNTER — Encounter: Payer: Self-pay | Admitting: Oncology

## 2017-04-16 ENCOUNTER — Encounter: Payer: Self-pay | Admitting: General Surgery

## 2017-04-16 ENCOUNTER — Telehealth: Payer: Self-pay | Admitting: *Deleted

## 2017-04-16 ENCOUNTER — Ambulatory Visit (INDEPENDENT_AMBULATORY_CARE_PROVIDER_SITE_OTHER): Payer: Medicare PPO | Admitting: General Surgery

## 2017-04-16 ENCOUNTER — Inpatient Hospital Stay: Payer: Self-pay

## 2017-04-16 VITALS — BP 130/74 | HR 75 | Resp 14 | Ht 62.0 in | Wt 188.0 lb

## 2017-04-16 DIAGNOSIS — Z171 Estrogen receptor negative status [ER-]: Secondary | ICD-10-CM

## 2017-04-16 DIAGNOSIS — N6321 Unspecified lump in the left breast, upper outer quadrant: Secondary | ICD-10-CM | POA: Diagnosis not present

## 2017-04-16 DIAGNOSIS — N6320 Unspecified lump in the left breast, unspecified quadrant: Secondary | ICD-10-CM

## 2017-04-16 DIAGNOSIS — C50412 Malignant neoplasm of upper-outer quadrant of left female breast: Secondary | ICD-10-CM | POA: Diagnosis not present

## 2017-04-16 NOTE — Telephone Encounter (Signed)
Message for patient to call the office.   We need to inform patient of arrival time the day of surgery, 04-23-17.

## 2017-04-16 NOTE — Progress Notes (Addendum)
Patient ID: Megan Santana, female   DOB: March 09, 1934, 83 y.o.   MRN: [id number]  Chief Complaint  Patient presents with  . Other    breast cancer    HPI Megan Santana is a 83 y.o. female.  who presents for a breast evaluation. The most recent mammogram and bilateral breast biopsy was done on 03-31-17. She could not feel anything different in the breast . She states the left breast was cancerous and right breast was scarring from previous cancer. Patient does perform regular self breast checks and gets regular mammograms done. She did miss 2017 mammogram.   History of right breast cancer 2007. She fell on her shoulder in February on a patch of ice before going to church. She is here with her son, Megan Santana, daughter in law Ivori Storr and daughter Nash Mantis.  HPI  Past Medical History:  Diagnosis Date  . Breast cancer [facility]) 2007   right lumpectomy  . Breast cancer ([place]) 03/31/2017   left breast / INVASIVE MAMMARY CARCINOMA grade 3  . Lumbar radiculitis   . Lumbar stenosis     Past Surgical History:  Procedure Laterality Date  . ABDOMINAL HYSTERECTOMY     total  . BACK SURGERY    . BREAST BIOPSY Bilateral 03/31/2017   bilat u/s core INVASIVE MAMMARY CARCINOMA  . BREAST BIOPSY Right 03/31/2017   DENSE FIBROSIS AND SHEETS OF MACROPHAGES  . BREAST LUMPECTOMY Right 2007  . BREAST SURGERY Right    masectomy  . EYE SURGERY      Family History  Problem Relation Age of Onset  . Breast cancer Sister 80  . Lung cancer Mother 51    mets to breast  . Lung cancer Father 52  . Heart attack Brother   . Breast cancer Maternal Aunt     age unknown    Social History Social History  Substance Use Topics  . Smoking status: Former Smoker    Years: 10.00    Types: Cigarettes    Quit date: 1970  . Smokeless tobacco: Never Used  . Alcohol use Yes     Comment: occasionally    Allergies  Allergen Reactions  . Oxycodone Other (See Comments)    Can't sleep, feels bad  when taking.    Current Outpatient Prescriptions  Medication Sig Dispense Refill  . Potassium 99 MG TABS Take 99 mg by mouth 2 (two) times daily.     . vitamin C (ASCORBIC ACID) 500 MG tablet Take 500 mg by mouth daily.    Marland Kitchen acetaminophen (TYLENOL) 500 MG tablet Take 1,000 mg by mouth every 6 (six) hours as needed (for pain.).    Marland Kitchen Calcium Carb-Cholecalciferol (CALCIUM 600+D) 600-800 MG-UNIT TABS Take 1 tablet by mouth 2 (two) times daily.    . calcium carbonate (TUMS EX) 750 MG chewable tablet Chew 1-2 tablets by mouth 3 (three) times daily as needed (for heartburn/indigestion).    . Cholecalciferol (VITAMIN D-3) 5000 units TABS Take 5,000 Units by mouth daily.    . magnesium oxide (MAG-OX) 400 MG tablet Take 400 mg by mouth daily.    . Multiple Vitamin (MULTIVITAMIN WITH MINERALS) TABS tablet Take 1 tablet by mouth daily. Senior Multivitamin     No current facility-administered medications for this visit.     Review of Systems Review of Systems  Constitutional: Negative.   Respiratory: Negative.   Cardiovascular: Negative.     Blood pressure 130/74, pulse 75, resp. rate 14, height _0  (1.575  m), weight 188 lb (85.3 kg).  Physical Exam Physical Exam  Constitutional: She is oriented to person, place, and time. She appears well-developed and well-nourished.  HENT:  Mouth/Throat: Oropharynx is clear and moist.  Eyes: Conjunctivae are normal. No scleral icterus.  Neck: Neck supple.  Cardiovascular: Normal rate and regular rhythm.   Murmur heard.  Systolic murmur is present with a grade of 2/6  Pulmonary/Chest: Effort normal and breath sounds normal. Right breast exhibits no inverted nipple, no mass, no nipple discharge, no skin change and no tenderness. Left breast exhibits no inverted nipple, no mass, no nipple discharge, no skin change and no tenderness.  Thickening upper outer quadrant  Abdominal: Soft.  Lymphadenopathy:    She has no cervical adenopathy.    She has no  axillary adenopathy.  Neurological: She is alert and oriented to person, place, and time.  Skin: Skin is warm and dry.  Psychiatric: Her behavior is normal.    Data Reviewed Pathology from the biopsy completed 03/28/2017 showed an ER, PR, HER-2 negative tumor.  427, 2018 medical oncology note from Delight Hoh, M.D. reviewed. Plans for postoperative adjuvant chemotherapy. 4 cycles of Taxotere and Cytoxan have been recommended.  Examination of the patient's upper extremity shows fairly good venous access. She has only had a sentinel node biopsy in the right axilla, and the right upper extremity can be used freely for access. I anticipate she will only have a sentinel node biopsy on the left axilla, and if necessary the left upper extremity could be used for venous access.   Ultrasound completed to determine a preoperative needle localization was required was completed on the left breast. In the upper outer quadrant at the 1:00 position, 8 cm from nipple and a proximally 2 cm deep and irregular hypoechoic mass to all of that was wide measuring 0.8 x 0.95 x 1.09 cm is identified. This corresponds to the biopsy site. BI-[id number]-6.  Assessment     Primary cancer of the left upper outer quadrant of the breast.    Plan    The patient raised the question of whether observation alone could be undertaken. This was discouraged.   She has been troubled by breast asymmetry since her original procedure on the contralateral breast in 2007. The possibility for plastic surgery evaluation to discuss establish breast symmetry was reviewed. This would likely entail several week delayed treatment to schedule plastic surgery consultation and cord preoperative settles. As adjuvant chemotherapy is planned by the medical oncology service for her triple negative tumor if she were developed a wound complication this might setback therapy several months. My bias would be to treat the cancer and one her treatment is  over it symmetry surgery is required prior to additional radiation therapy (and a long discussion was held regarding the advisability of post surgery radiation and her age bracket) plastic surgery involvement could be obtained at that time.  At present the patient's amenable to proceed with the primary surgery including sentinel node biopsy. Surgical risks were reviewed. This will be schedule convenient date.  At this time, I do not feel that a PowerPort is required.       HPI, Physical Exam, Assessment and Plan have been scribed under the direction and in the presence of Robert Bellow, MD.  Karie Fetch, RN I have completed the exam and reviewed the above documentation for accuracy and completeness.  I agree with the above.  Haematologist has been used and any errors in dictation or transcription  are unintentional.  Hervey Ard, M.D., F.A.C.S.  Robert Bellow 04/17/2017, 4:49 AM  Patient's surgery has been scheduled for 04-23-17 at [facility].   Dominga Ferry, CMA

## 2017-04-16 NOTE — Patient Instructions (Signed)
The patient is aware to call back for any questions or concerns.  

## 2017-04-16 NOTE — Telephone Encounter (Signed)
Patient called the office back and is aware of date, time, and instructions.   She verbalizes understanding.

## 2017-04-17 ENCOUNTER — Encounter
Admission: RE | Admit: 2017-04-17 | Discharge: 2017-04-17 | Disposition: A | Discharge status: Home / Self Care | Payer: Medicare PPO | Source: Ambulatory Visit | Attending: General Surgery | Admitting: General Surgery

## 2017-04-17 HISTORY — DX: Family history of other specified conditions: Z84.89

## 2017-04-17 HISTORY — DX: Unspecified osteoarthritis, unspecified site: M19.90

## 2017-04-17 HISTORY — DX: Gastro-esophageal reflux disease without esophagitis: K21.9

## 2017-04-17 HISTORY — DX: Cardiac murmur, unspecified: R01.1

## 2017-04-17 NOTE — Patient Instructions (Signed)
  Your procedure is scheduled on: 04-23-17 (Thursday) Report to [place] @ 11:15 PER PT  Remember: Instructions that are not followed completely may result in serious medical risk, up to and including death, or upon the discretion of your surgeon and anesthesiologist your surgery may need to be rescheduled.    _x___ 1. Do not eat food or drink liquids after midnight. No gum chewing or    hard candies.     __x__ 2. No Alcohol for 24 hours before or after surgery.   __x__3. No Smoking for 24 prior to surgery.   ____  4. Bring all medications with you on the day of surgery if instructed.    __x__ 5. Notify your doctor if there is any change in your medical condition     (cold, fever, infections).     Do not wear jewelry, make-up, hairpins, clips or nail polish.  Do not wear lotions, powders, or perfumes. You may wear deodorant.  Do not shave 48 hours prior to surgery. Men may shave face and neck.  Do not bring valuables to the hospital.    [facility] is not responsible for any belongings or valuables.               Contacts, dentures or bridgework may not be worn into surgery.  Leave your suitcase in the car. After surgery it may be brought to your room.  For patients admitted to the hospital, discharge time is determined by your   treatment team.   Patients discharged the day of surgery will not be allowed to drive home.  You will need someone to drive you home and stay with you the night of your procedure.    Please read over the following fact sheets that you were given:   [facility] Preparing for Surgery and or MRSA Information   ____ Take anti-hypertensive (unless it includes a diuretic), cardiac, seizure, asthma,     anti-reflux and psychiatric medicines. These include:  1. NONE  2.  3.  4.  5.  6.  ____Fleets enema or Magnesium Citrate as directed.   ____ Use CHG Soap or sage wipes as directed on instruction sheet   ____ Use inhalers on the  day of surgery and bring to hospital day of surgery  ____ Stop Metformin and Janumet 2 days prior to surgery.    ____ Take 1/2 of usual insulin dose the night before surgery and none on the morning     surgery.   ____ Follow recommendations from Cardiologist, Pulmonologist or PCP regarding stopping Aspirin, Coumadin, Pllavix ,Eliquis, Effient, or Pradaxa, and Pletal.  ___Stop Anti-inflammatories such as Advil, Aleve, Ibuprofen, Motrin, Naproxen, Naprosyn, Goodies powders or aspirin products. OK to take Tylenol .   ____ Stop supplements until after surgery.  But may continue Vitamin D, Vitamin B,       and multivitamin.   ____ Bring C-Pap to the hospital.

## 2017-04-20 ENCOUNTER — Encounter: Payer: Self-pay | Admitting: Diagnostic Radiology

## 2017-04-23 ENCOUNTER — Ambulatory Visit
Admission: RE | Admit: 2017-04-23 | Discharge: 2017-04-23 | Disposition: A | Discharge status: Home / Self Care | Payer: Medicare PPO | Source: Ambulatory Visit | Attending: General Surgery | Admitting: General Surgery

## 2017-04-23 ENCOUNTER — Ambulatory Visit: Payer: Medicare PPO | Admitting: Anesthesiology

## 2017-04-23 ENCOUNTER — Encounter
Admission: RE | Admit: 2017-04-23 | Discharge: 2017-04-23 | Disposition: A | Discharge status: Home / Self Care | Payer: Medicare PPO | Source: Ambulatory Visit | Attending: General Surgery | Admitting: General Surgery

## 2017-04-23 ENCOUNTER — Ambulatory Visit: Payer: Medicare PPO

## 2017-04-23 ENCOUNTER — Encounter: Payer: Self-pay | Admitting: *Deleted

## 2017-04-23 ENCOUNTER — Encounter
Admission: RE | Disposition: A | Discharge status: Home / Self Care | Payer: Self-pay | Source: Ambulatory Visit | Attending: General Surgery

## 2017-04-23 DIAGNOSIS — Z171 Estrogen receptor negative status [ER-]: Principal | ICD-10-CM

## 2017-04-23 DIAGNOSIS — C50412 Malignant neoplasm of upper-outer quadrant of left female breast: Secondary | ICD-10-CM | POA: Diagnosis not present

## 2017-04-23 DIAGNOSIS — Z90710 Acquired absence of both cervix and uterus: Secondary | ICD-10-CM | POA: Insufficient documentation

## 2017-04-23 DIAGNOSIS — Z853 Personal history of malignant neoplasm of breast: Secondary | ICD-10-CM | POA: Diagnosis not present

## 2017-04-23 DIAGNOSIS — C50912 Malignant neoplasm of unspecified site of left female breast: Secondary | ICD-10-CM | POA: Diagnosis present

## 2017-04-23 DIAGNOSIS — Z87891 Personal history of nicotine dependence: Secondary | ICD-10-CM | POA: Insufficient documentation

## 2017-04-23 DIAGNOSIS — Z803 Family history of malignant neoplasm of breast: Secondary | ICD-10-CM | POA: Insufficient documentation

## 2017-04-23 DIAGNOSIS — Z885 Allergy status to narcotic agent status: Secondary | ICD-10-CM | POA: Insufficient documentation

## 2017-04-23 DIAGNOSIS — K219 Gastro-esophageal reflux disease without esophagitis: Secondary | ICD-10-CM | POA: Diagnosis not present

## 2017-04-23 DIAGNOSIS — R928 Other abnormal and inconclusive findings on diagnostic imaging of breast: Secondary | ICD-10-CM

## 2017-04-23 HISTORY — PX: BREAST LUMPECTOMY: SHX2

## 2017-04-23 SURGERY — BREAST LUMPECTOMY WITH EXCISION OF SENTINEL NODE
Anesthesia: General | Site: Breast | Laterality: Left | Wound class: Clean

## 2017-04-23 MED ORDER — HYDROCODONE-ACETAMINOPHEN 5-325 MG PO TABS: 1.0000 | ORAL_TABLET | ORAL | 0 refills | Status: DC | PRN

## 2017-04-23 MED ORDER — DEXAMETHASONE SODIUM PHOSPHATE 10 MG/ML IJ SOLN
INTRAMUSCULAR | Status: DC | PRN
  Administered 2017-04-23: 10 mg via INTRAVENOUS

## 2017-04-23 MED ORDER — METHYLENE BLUE 0.5 % INJ SOLN
INTRAVENOUS | Status: DC | PRN
  Administered 2017-04-23: 5 mL

## 2017-04-23 MED ORDER — BUPIVACAINE-EPINEPHRINE (PF) 0.5% -1:200000 IJ SOLN
INTRAMUSCULAR | Status: DC | PRN
  Administered 2017-04-23: 30 mL via PERINEURAL

## 2017-04-23 MED ORDER — DEXAMETHASONE SODIUM PHOSPHATE 10 MG/ML IJ SOLN
INTRAMUSCULAR | Status: AC
  Filled 2017-04-23: qty 1

## 2017-04-23 MED ORDER — ACETAMINOPHEN 10 MG/ML IV SOLN
INTRAVENOUS | Status: DC | PRN
  Administered 2017-04-23: 1000 mg via INTRAVENOUS

## 2017-04-23 MED ORDER — ACETAMINOPHEN 10 MG/ML IV SOLN
INTRAVENOUS | Status: AC
  Filled 2017-04-23: qty 100

## 2017-04-23 MED ORDER — KETOROLAC TROMETHAMINE 30 MG/ML IJ SOLN
INTRAMUSCULAR | Status: AC
  Filled 2017-04-23: qty 1

## 2017-04-23 MED ORDER — FENTANYL CITRATE (PF) 100 MCG/2ML IJ SOLN
INTRAMUSCULAR | Status: AC
  Administered 2017-04-23: 25 ug via INTRAVENOUS
  Filled 2017-04-23: qty 2

## 2017-04-23 MED ORDER — ONDANSETRON HCL 4 MG/2ML IJ SOLN
INTRAMUSCULAR | Status: AC
  Filled 2017-04-23: qty 2

## 2017-04-23 MED ORDER — FENTANYL CITRATE (PF) 100 MCG/2ML IJ SOLN
INTRAMUSCULAR | Status: AC
  Filled 2017-04-23: qty 2

## 2017-04-23 MED ORDER — MIDAZOLAM HCL 2 MG/2ML IJ SOLN
INTRAMUSCULAR | Status: DC | PRN
  Administered 2017-04-23: 1 mg via INTRAVENOUS

## 2017-04-23 MED ORDER — ONDANSETRON HCL 4 MG/2ML IJ SOLN
INTRAMUSCULAR | Status: DC | PRN
  Administered 2017-04-23: 4 mg via INTRAVENOUS

## 2017-04-23 MED ORDER — KETOROLAC TROMETHAMINE 30 MG/ML IJ SOLN
INTRAMUSCULAR | Status: DC | PRN
  Administered 2017-04-23: 15 mg via INTRAVENOUS

## 2017-04-23 MED ORDER — TECHNETIUM TC 99M SULFUR COLLOID FILTERED
0.7120 | Freq: Once | INTRAVENOUS | Status: AC | PRN
  Administered 2017-04-23: 0.712 via INTRADERMAL

## 2017-04-23 MED ORDER — FENTANYL CITRATE (PF) 100 MCG/2ML IJ SOLN
25.0000 ug | INTRAMUSCULAR | Status: AC | PRN
  Administered 2017-04-23 (×6): 25 ug via INTRAVENOUS

## 2017-04-23 MED ORDER — METHYLENE BLUE 0.5 % INJ SOLN
INTRAVENOUS | Status: AC
  Filled 2017-04-23: qty 10

## 2017-04-23 MED ORDER — FAMOTIDINE 20 MG PO TABS
20.0000 mg | ORAL_TABLET | Freq: Once | ORAL | Status: AC
  Administered 2017-04-23: 20 mg via ORAL

## 2017-04-23 MED ORDER — LIDOCAINE HCL (CARDIAC) 20 MG/ML IV SOLN
INTRAVENOUS | Status: DC | PRN
  Administered 2017-04-23: 100 mg via INTRAVENOUS

## 2017-04-23 MED ORDER — ONDANSETRON HCL 4 MG/2ML IJ SOLN: 4.0000 mg | Freq: Once | INTRAMUSCULAR | Status: DC | PRN

## 2017-04-23 MED ORDER — PROPOFOL 10 MG/ML IV BOLUS
INTRAVENOUS | Status: DC | PRN
  Administered 2017-04-23: 150 mg via INTRAVENOUS
  Administered 2017-04-23: 50 mg via INTRAVENOUS

## 2017-04-23 MED ORDER — FENTANYL CITRATE (PF) 100 MCG/2ML IJ SOLN
INTRAMUSCULAR | Status: DC | PRN
  Administered 2017-04-23 (×4): 25 ug via INTRAVENOUS

## 2017-04-23 MED ORDER — LACTATED RINGERS IV SOLN
INTRAVENOUS | Status: DC
  Administered 2017-04-23: 12:00:00 via INTRAVENOUS

## 2017-04-23 MED ORDER — FENTANYL CITRATE (PF) 100 MCG/2ML IJ SOLN
25.0000 ug | INTRAMUSCULAR | Status: DC | PRN
  Administered 2017-04-23 (×2): 25 ug via INTRAVENOUS

## 2017-04-23 MED ORDER — METHYLERGONOVINE MALEATE 0.2 MG/ML IJ SOLN
INTRAMUSCULAR | Status: AC
  Filled 2017-04-23: qty 1

## 2017-04-23 MED ORDER — FAMOTIDINE 20 MG PO TABS
ORAL_TABLET | ORAL | Status: AC
  Administered 2017-04-23: 20 mg via ORAL
  Filled 2017-04-23: qty 1

## 2017-04-23 MED ORDER — PHENYLEPHRINE HCL 10 MG/ML IJ SOLN
INTRAMUSCULAR | Status: DC | PRN
  Administered 2017-04-23: 50 ug via INTRAVENOUS

## 2017-04-23 MED ORDER — BUPIVACAINE-EPINEPHRINE (PF) 0.5% -1:200000 IJ SOLN
INTRAMUSCULAR | Status: AC
  Filled 2017-04-23: qty 30

## 2017-04-23 SURGICAL SUPPLY — 55 items
BANDAGE ELASTIC 6 LF NS (GAUZE/BANDAGES/DRESSINGS) IMPLANT
BLADE SURG 15 STRL SS SAFETY (BLADE) ×8 IMPLANT
BNDG CMPR MED 5X6 ELC HKLP NS (GAUZE/BANDAGES/DRESSINGS)
BNDG GAUZE 4.5X4.1 6PLY STRL (MISCELLANEOUS) IMPLANT
BRA SURGICAL XLRG (MISCELLANEOUS) ×4 IMPLANT
BULB RESERV EVAC DRAIN JP 100C (MISCELLANEOUS) IMPLANT
CANISTER SUCT 1200ML W/VALVE (MISCELLANEOUS) ×4 IMPLANT
CHLORAPREP W/TINT 26ML (MISCELLANEOUS) ×4 IMPLANT
CLOSURE WOUND 1/2 X4 (GAUZE/BANDAGES/DRESSINGS) ×1
CNTNR SPEC 2.5X3XGRAD LEK (MISCELLANEOUS)
CONT SPEC 4OZ STER OR WHT (MISCELLANEOUS)
CONT SPEC 4OZ STRL OR WHT (MISCELLANEOUS)
CONTAINER SPEC 2.5X3XGRAD LEK (MISCELLANEOUS) IMPLANT
COVER PROBE FLX POLY STRL (MISCELLANEOUS) ×4 IMPLANT
DEVICE DUBIN SPECIMEN MAMMOGRA (MISCELLANEOUS) ×4 IMPLANT
DRAIN CHANNEL JP 15F RND 16 (MISCELLANEOUS) IMPLANT
DRAPE LAPAROTOMY TRNSV 106X77 (MISCELLANEOUS) ×4 IMPLANT
DRSG TELFA 3X8 NADH (GAUZE/BANDAGES/DRESSINGS) ×4 IMPLANT
ELECT BLADE 6.5 EXT (BLADE) ×4 IMPLANT
ELECT CAUTERY BLADE TIP 2.5 (TIP) ×4
ELECT REM PT RETURN 9FT ADLT (ELECTROSURGICAL) ×4
ELECTRODE CAUTERY BLDE TIP 2.5 (TIP) ×2 IMPLANT
ELECTRODE REM PT RTRN 9FT ADLT (ELECTROSURGICAL) ×2 IMPLANT
GAUZE FLUFF 18X24 1PLY STRL (GAUZE/BANDAGES/DRESSINGS) ×4 IMPLANT
GAUZE SPONGE 4X4 12PLY STRL (GAUZE/BANDAGES/DRESSINGS) IMPLANT
GLOVE BIO SURGEON STRL SZ7.5 (GLOVE) ×12 IMPLANT
GLOVE INDICATOR 8.0 STRL GRN (GLOVE) ×8 IMPLANT
GOWN STRL REUS W/ TWL LRG LVL3 (GOWN DISPOSABLE) ×4 IMPLANT
GOWN STRL REUS W/TWL LRG LVL3 (GOWN DISPOSABLE) ×8
KIT RM TURNOVER STRD PROC AR (KITS) ×4 IMPLANT
LABEL OR SOLS (LABEL) ×4 IMPLANT
MARGIN MAP 10MM (MISCELLANEOUS) ×4 IMPLANT
NDL SAFETY 22GX1.5 (NEEDLE) ×4 IMPLANT
NEEDLE HYPO 25X1 1.5 SAFETY (NEEDLE) ×4 IMPLANT
PACK BASIN MINOR ARMC (MISCELLANEOUS) ×4 IMPLANT
SHEARS FOC LG CVD HARMONIC 17C (MISCELLANEOUS) IMPLANT
SHEARS HARMONIC 9CM CVD (BLADE) IMPLANT
SLEVE PROBE SENORX GAMMA FIND (MISCELLANEOUS) ×4 IMPLANT
STRIP CLOSURE SKIN 1/2X4 (GAUZE/BANDAGES/DRESSINGS) ×3 IMPLANT
SUT ETHILON 3-0 FS-10 30 BLK (SUTURE) ×4
SUT SILK 2 0 (SUTURE)
SUT SILK 2-0 18XBRD TIE 12 (SUTURE) IMPLANT
SUT VIC AB 2-0 CT1 27 (SUTURE) ×8
SUT VIC AB 2-0 CT1 TAPERPNT 27 (SUTURE) ×4 IMPLANT
SUT VIC AB 3-0 SH 27 (SUTURE) ×3
SUT VIC AB 3-0 SH 27X BRD (SUTURE) ×2 IMPLANT
SUT VIC AB 4-0 FS2 27 (SUTURE) ×4 IMPLANT
SUT VICRYL+ 3-0 144IN (SUTURE) ×4 IMPLANT
SUTURE EHLN 3-0 FS-10 30 BLK (SUTURE) ×2 IMPLANT
SWABSTK COMLB BENZOIN TINCTURE (MISCELLANEOUS) ×4 IMPLANT
SYR BULB IRRIG 60ML STRL (SYRINGE) ×4 IMPLANT
SYR CONTROL 10ML (SYRINGE) ×4 IMPLANT
SYRINGE 10CC LL (SYRINGE) ×4 IMPLANT
TAPE TRANSPORE STRL 2 31045 (GAUZE/BANDAGES/DRESSINGS) ×4 IMPLANT
WATER STERILE IRR 1000ML POUR (IV SOLUTION) ×4 IMPLANT

## 2017-04-23 NOTE — Anesthesia Post-op Follow-up Note (Cosign Needed)
Anesthesia QCDR form completed.        

## 2017-04-23 NOTE — Transfer of Care (Signed)
Immediate Anesthesia Transfer of Care Note  Patient: Megan Santana  Procedure(s) Performed: Procedure(s): BREAST LUMPECTOMY WITH EXCISION OF SENTINEL NODE (Left)  Patient Location: PACU  Anesthesia Type:General  Level of Consciousness: drowsy and responds to stimulation  Airway & Oxygen Therapy: Patient Spontanous Breathing and Patient connected to face mask oxygen  Post-op Assessment: Report given to RN and Post -op Vital signs reviewed and stable  Post vital signs: Reviewed and stable  Last Vitals:  Vitals:   04/23/17 1154 04/23/17 1547  BP: 139/71 115/62  Pulse: 84 70  Resp: 18 16  Temp: 37.1 C 36.2 C    Last Pain:  Vitals:   04/23/17 1154  TempSrc: Tympanic         Complications: No apparent anesthesia complications

## 2017-04-23 NOTE — H&P (Signed)
No change in clinical exam or history.

## 2017-04-23 NOTE — Anesthesia Preprocedure Evaluation (Signed)
Anesthesia Evaluation  Patient identified by MRN, date of birth, ID band Patient awake    Reviewed: Allergy & Precautions, H&P , NPO status , Patient's Chart, lab work & pertinent test results, reviewed documented beta blocker date and time   Airway Mallampati: II  TM Distance: >3 FB Neck ROM: full    Dental  (+) Teeth Intact   Pulmonary neg pulmonary ROS, former smoker,    Pulmonary exam normal        Cardiovascular Exercise Tolerance: Good negative cardio ROS Normal cardiovascular exam+ Valvular Problems/Murmurs  Rate:Normal     Neuro/Psych  Neuromuscular disease negative neurological ROS  negative psych ROS   GI/Hepatic negative GI ROS, Neg liver ROS, GERD  Medicated,  Endo/Other  negative endocrine ROS  Renal/GU negative Renal ROS  negative genitourinary   Musculoskeletal   Abdominal   Peds  Hematology negative hematology ROS (+)   Anesthesia Other Findings   Reproductive/Obstetrics negative OB ROS                             Anesthesia Physical Anesthesia Plan  ASA: II  Anesthesia Plan: General LMA   Post-op Pain Management:    Induction:   Airway Management Planned:   Additional Equipment:   Intra-op Plan:   Post-operative Plan:   Informed Consent: I have reviewed the patients History and Physical, chart, labs and discussed the procedure including the risks, benefits and alternatives for the proposed anesthesia with the patient or authorized representative who has indicated his/her understanding and acceptance.     Plan Discussed with: CRNA  Anesthesia Plan Comments:         Anesthesia Quick Evaluation

## 2017-04-23 NOTE — Discharge Instructions (Signed)
AMBULATORY SURGERY  °DISCHARGE INSTRUCTIONS ° ° °1) The drugs that you were given will stay in your system until tomorrow so for the next 24 hours you should not: ° °A) Drive an automobile °B) Make any legal decisions °C) Drink any alcoholic beverage ° ° °2) You may resume regular meals tomorrow.  Today it is better to start with liquids and gradually work up to solid foods. ° °You may eat anything you prefer, but it is better to start with liquids, then soup and crackers, and gradually work up to solid foods. ° ° °3) Please notify your doctor immediately if you have any unusual bleeding, trouble breathing, redness and pain at the surgery site, drainage, fever, or pain not relieved by medication. ° °Please contact your physician with any problems or Same Day Surgery at [phone], Monday through Friday 6 am to 4 pm, or [place] [facility] number at [phone]. °

## 2017-04-23 NOTE — Op Note (Signed)
Preoperative diagnosis: Left breast cancer.  Postoperative diagnosis: Same.   Operative procedure: Left breast wide excision with ultrasound guidance, sentinel node biopsy.  Operating surgeon: Ollen Bowl, M.D.  Anesthesia: Gen. by LMA, Marcaine 0.5% with 1-200,000 of epinephrine, 30 mL local infiltration.  Clinical note: This 83 year old woman had an abnormal mammogram and core biopsy showed evidence of invasive mammary carcinoma. She desired breast conservation. She was injected with technetium sulfur colloid prior to the procedure.  SCD stockings for DVT prevention.  Operative note: With the patient under adequate general anesthesia 5 mL of 0.5% methylene blue was infiltrated in the subareolar plexus. The breast was then prepped with ChloraPrep and draped. The rest was taped inferiorly to better expose the upper outer quadrant with her pendulous breast volume. Ultrasound was used to confirm location of the mass in the deep aspect of the 1:00 position. Local anesthetic was infiltrated for postoperative analgesia. A curvilinear incision from the 12 to 2:00 position was made and carried aphthous consulting this tissue with hemostasis achieved by electrocautery. A block of tissue 4 x 4 by 5 cm was excised orientated and sent for specimen radiograph. The biopsy clip was done well orientated within the sample. Gross examination by pathology showed no grossly positive margins although the mass was estimated to be 2 cm in diameter where it was only 1 cm on ultrasound. Attention was turned to the axilla. The node cecum was used and a area of increased uptake high in the axilla could be approached through the wide excision site. A single hot, non-blue node was excised. Counts dropped from 5000 to less than 150. Good hemostasis was noted and achieved with electrocautery and 3-0 Vicryl ties. There was no residual breast tissue inferior to the wide excision site and the deep excision had been down to but not  including pectoralis fascia.  The wound was closed in layers with interrupted 2-0 Vicryl figure-of-eight sutures. The skin was closed with a running 4-0 Vicryl septic suture. Benzoin, Steri-Strips, Telfa and Tegaderm dressings were then applied.  The patient was placed and with surgical bra with fluffed gauze dressing. She was taken to recovery in stable condition.

## 2017-04-23 NOTE — Anesthesia Procedure Notes (Signed)
Procedure Name: LMA Insertion Date/Time: 04/23/2017 2:12 PM Performed by: Aline Brochure Pre-anesthesia Checklist: Patient identified, Emergency Drugs available, Suction available and Patient being monitored Patient Re-evaluated:Patient Re-evaluated prior to inductionOxygen Delivery Method: Circle system utilized Preoxygenation: Pre-oxygenation with 100% oxygen Intubation Type: IV induction Ventilation: Mask ventilation without difficulty LMA: LMA inserted Tube size: 4.0 mm Number of attempts: 1 Placement Confirmation: positive ETCO2 and breath sounds checked- equal and bilateral Tube secured with: Tape Dental Injury: Teeth and Oropharynx as per pre-operative assessment

## 2017-04-23 NOTE — Anesthesia Postprocedure Evaluation (Signed)
Anesthesia Post Note  Patient: Megan Santana  Procedure(s) Performed: Procedure(s) (LRB): BREAST LUMPECTOMY WITH EXCISION OF SENTINEL NODE (Left)  Patient location during evaluation: PACU Anesthesia Type: General Level of consciousness: awake and alert and oriented Pain management: pain level controlled Vital Signs Assessment: post-procedure vital signs reviewed and stable Respiratory status: spontaneous breathing Cardiovascular status: blood pressure returned to baseline Anesthetic complications: no     Last Vitals:  Vitals:   04/23/17 1653 04/23/17 1704  BP: 116/69 138/73  Pulse: 69 68  Resp: 15 16  Temp: 36.6 C 36.7 C    Last Pain:  Vitals:   04/23/17 1704  TempSrc: Oral  PainSc: 3                  Shaylee Stanislawski

## 2017-04-24 ENCOUNTER — Encounter: Payer: Self-pay | Admitting: General Surgery

## 2017-04-28 ENCOUNTER — Ambulatory Visit: Payer: Medicare PPO | Admitting: General Surgery

## 2017-04-28 ENCOUNTER — Other Ambulatory Visit: Payer: Self-pay | Admitting: Pathology

## 2017-04-28 ENCOUNTER — Encounter: Payer: Self-pay | Admitting: General Surgery

## 2017-04-28 ENCOUNTER — Inpatient Hospital Stay: Payer: Self-pay

## 2017-04-28 ENCOUNTER — Ambulatory Visit (INDEPENDENT_AMBULATORY_CARE_PROVIDER_SITE_OTHER): Payer: Medicare PPO | Admitting: General Surgery

## 2017-04-28 VITALS — BP 142/68 | HR 66 | Resp 14 | Ht 62.0 in | Wt 186.0 lb

## 2017-04-28 DIAGNOSIS — C50412 Malignant neoplasm of upper-outer quadrant of left female breast: Secondary | ICD-10-CM | POA: Diagnosis not present

## 2017-04-28 DIAGNOSIS — Z171 Estrogen receptor negative status [ER-]: Secondary | ICD-10-CM | POA: Diagnosis not present

## 2017-04-28 LAB — SURGICAL PATHOLOGY

## 2017-04-28 NOTE — Progress Notes (Signed)
Patient ID: Megan Santana, female   DOB: 11-28-1934, 83 y.o.   MRN: [id number]  Chief Complaint  Patient presents with  . Routine Post Op    left breast wide excision     HPI Megan Santana is a 83 y.o. female here today for her post op left breast wide excision done on 04/23/2017. Patient states she is doing well.   HPI  Past Medical History:  Diagnosis Date  . Arthritis   . Breast cancer [facility]) 2007   right lumpectomy  . Breast cancer ([place]) 03/31/2017   left breast / INVASIVE MAMMARY CARCINOMA grade 3  . Family history of adverse reaction to anesthesia    mom n/v  . GERD (gastroesophageal reflux disease)   . Heart murmur   . Lumbar radiculitis   . Lumbar stenosis     Past Surgical History:  Procedure Laterality Date  . ABDOMINAL HYSTERECTOMY     total  . BACK SURGERY     lumbar  . BREAST BIOPSY Bilateral 03/31/2017   bilat u/s core INVASIVE MAMMARY CARCINOMA  . BREAST BIOPSY Right 03/31/2017   DENSE FIBROSIS AND SHEETS OF MACROPHAGES  . BREAST LUMPECTOMY Right 2007  . BREAST LUMPECTOMY Left 04/23/2017  . BREAST LUMPECTOMY Left 04/23/2017   Procedure: BREAST LUMPECTOMY WITH EXCISION OF SENTINEL NODE;  Surgeon: Megan Bellow, MD;  Location: [facility] ORS;  Service: General;  Laterality: Left;  . BREAST SURGERY Right    masectomy  . EYE SURGERY     cataracts bil  . FRACTURE SURGERY Left 2015 or 2016  . TUMOR EXCISION  2001    Family History  Problem Relation Age of Onset  . Breast cancer Sister 29  . Lung cancer Mother 63       mets to breast  . Lung cancer Father 71  . Heart attack Brother   . Breast cancer Maternal Aunt        age unknown    Social History Social History  Substance Use Topics  . Smoking status: Former Smoker    Packs/day: 0.25    Years: 10.00    Types: Cigarettes    Quit date: 18  . Smokeless tobacco: Never Used  . Alcohol use Yes     Comment: occasionally    Allergies  Allergen Reactions  . Oxycodone Other (See Comments)     Can't sleep, feels bad when taking.    Current Outpatient Prescriptions  Medication Sig Dispense Refill  . acetaminophen (TYLENOL) 500 MG tablet Take 1,000 mg by mouth every 6 (six) hours as needed (for pain.).    Marland Kitchen Calcium Carb-Cholecalciferol (CALCIUM 600+D) 600-800 MG-UNIT TABS Take 1 tablet by mouth 2 (two) times daily.    . calcium carbonate (TUMS EX) 750 MG chewable tablet Chew 1-2 tablets by mouth 3 (three) times daily as needed (for heartburn/indigestion).    . Cholecalciferol (VITAMIN D-3) 5000 units TABS Take 5,000 Units by mouth daily.    Marland Kitchen HYDROcodone-acetaminophen (NORCO) 5-325 MG tablet Take 1-2 tablets by mouth every 4 (four) hours as needed for moderate pain. 30 tablet 0  . magnesium oxide (MAG-OX) 400 MG tablet Take 400 mg by mouth at bedtime.     . Multiple Vitamin (MULTIVITAMIN WITH MINERALS) TABS tablet Take 1 tablet by mouth daily. Senior Multivitamin    . Potassium 99 MG TABS Take 99 mg by mouth 2 (two) times daily.     . vitamin C (ASCORBIC ACID) 500 MG tablet Take 500 mg by mouth  daily.     No current facility-administered medications for this visit.     Review of Systems Review of Systems  Constitutional: Negative.   Respiratory: Negative.   Cardiovascular: Negative.     Blood pressure (!) 142/68, pulse 66, resp. rate 14, height 5\' 2"  (1.575 m), weight 186 lb (84.4 kg).  Physical Exam Physical Exam  Constitutional: She is oriented to person, place, and time. She appears well-developed and well-nourished.  Pulmonary/Chest:    Left breast incision is clean and healing well.   Neurological: She is alert and oriented to person, place, and time.  Skin: Skin is warm and dry.    Data Reviewed Ultrasound examination of the wide excision site was completed to determine if the patient would be a candidate for accelerated partial breast radiation as she had had completed on the right side. There is a cavity measuring 1.7 x 3.3 x 6.8 cm approximately 3 cm below  the skin. Adequate volume for MammoSite placement.  Assessment    Doing well status post wide excision and sentinel node biopsy.    Plan    We will make arrangements for evaluation by Berton Mount, M.D. from radiation oncology, hopefully at the time of her upcoming appointment with Dr. Grayland Ormond.     HPI, Physical Exam, Assessment and Plan have been scribed under the direction and in the presence of Hervey Ard, MD.  Gaspar Cola, CMA  I have completed the exam and reviewed the above documentation for accuracy and completeness.  I agree with the above.  Haematologist has been used and any errors in dictation or transcription are unintentional.  Hervey Ard, M.D., F.A.C.S.  Megan Santana 04/28/2017, 9:21 PM

## 2017-04-29 ENCOUNTER — Telehealth: Payer: Self-pay | Admitting: General Surgery

## 2017-04-29 NOTE — Telephone Encounter (Signed)
Preliminary results of pathology provided to the patient yesterday at the time of her this visit have been confirmed. Margins negative. Node negative.  Patient to meet with radiation oncology and follow-up with medical oncology on Tuesday, May 22.

## 2017-05-03 NOTE — Progress Notes (Signed)
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  INTERVAL HISTORY: Patient returns to clinic today to discuss her final pathology results and treatment planning. She tolerated her lumpectomy well without significant side effects. She currently feels well and is asymptomatic. She has no neurologic complaints. She denies any recent fevers or illnesses. She has good appetite and denies weight loss. She has no chest pain or shortness of breath. She denies any nausea, vomiting, constipation, or diarrhea. She has no urinary complaints. Patient offers no specific complaints today.  REVIEW OF SYSTEMS:   Review of Systems  Constitutional: Negative.  Negative for fever, malaise/fatigue and weight loss.  Respiratory: Negative.  Negative for cough and shortness of breath.   Cardiovascular: Negative.  Negative for chest pain and leg swelling.  Gastrointestinal: Negative.  Negative for abdominal pain.  Genitourinary: Negative.   Musculoskeletal: Negative.   Skin: Negative.  Negative for rash.  Neurological: Negative.  Negative for sensory change and weakness.  Psychiatric/Behavioral: The patient is nervous/anxious.     As per HPI. Otherwise, a complete review of systems is negative.  PAST MEDICAL HISTORY: Past Medical History:  Diagnosis Date  . Arthritis   . Breast cancer [facility]) 2007   right lumpectomy  . Breast cancer ([place]) 03/31/2017   left breast / INVASIVE MAMMARY CARCINOMA grade 3  . Family history of adverse reaction to anesthesia    mom n/v  . GERD (gastroesophageal reflux disease)   . Heart  murmur   . Lumbar radiculitis   . Lumbar stenosis     PAST SURGICAL HISTORY: Past Surgical History:  Procedure Laterality Date  . ABDOMINAL HYSTERECTOMY     total  . BACK SURGERY     lumbar  . BREAST BIOPSY Bilateral 03/31/2017   bilat u/s core INVASIVE MAMMARY CARCINOMA  . BREAST BIOPSY Right 03/31/2017   DENSE FIBROSIS AND SHEETS OF MACROPHAGES  . BREAST LUMPECTOMY Right 2007  . BREAST LUMPECTOMY Left 04/23/2017  . BREAST LUMPECTOMY Left 04/23/2017   Procedure: BREAST LUMPECTOMY WITH EXCISION OF SENTINEL NODE;  Surgeon: Robert Bellow, MD;  Location: [facility] ORS;  Service: General;  Laterality: Left;  . BREAST SURGERY Right    masectomy  . EYE SURGERY     cataracts bil  . FRACTURE SURGERY Left 2015 or 2016  . TUMOR EXCISION  2001    FAMILY HISTORY: Family History  Problem Relation Age of Onset  . Breast cancer Sister 69  . Lung cancer Mother 36       mets to breast  . Lung cancer Father 20  . Heart attack Brother   . Breast cancer Maternal Aunt        age unknown    ADVANCED DIRECTIVES (Y/N):  N  HEALTH MAINTENANCE: Social History  Substance Use Topics  . Smoking status: Former Smoker    Packs/day: 0.25    Years: 10.00    Types: Cigarettes    Quit date: 40  . Smokeless tobacco: Never Used  . Alcohol use Yes     Comment: occasionally     Colonoscopy:  PAP:  Bone density:  Lipid panel:  Allergies  Allergen Reactions  .  Oxycodone Other (See Comments)    Can't sleep, feels bad when taking.    Current Outpatient Prescriptions  Medication Sig Dispense Refill  . acetaminophen (TYLENOL) 500 MG tablet Take 1,000 mg by mouth every 6 (six) hours as needed (for pain.).    Marland Kitchen Calcium Carb-Cholecalciferol (CALCIUM 600+D) 600-800 MG-UNIT TABS Take 1 tablet by mouth 2 (two) times daily.    . calcium carbonate (TUMS EX) 750 MG chewable tablet Chew 1-2 tablets by mouth 3 (three) times daily as needed (for heartburn/indigestion).    . cefadroxil (DURICEF) 500 MG  capsule Take 1 capsule (500 mg total) by mouth 2 (two) times daily. Start one hour prior to office procedure on 05-18-17 20 capsule 0  . Cholecalciferol (VITAMIN D-3) 5000 units TABS Take 5,000 Units by mouth daily.    Marland Kitchen HYDROcodone-acetaminophen (NORCO) 5-325 MG tablet Take 1-2 tablets by mouth every 4 (four) hours as needed for moderate pain. (Patient not taking: Reported on 05/05/2017) 30 tablet 0  . magnesium oxide (MAG-OX) 400 MG tablet Take 400 mg by mouth at bedtime.     . Multiple Vitamin (MULTIVITAMIN WITH MINERALS) TABS tablet Take 1 tablet by mouth daily. Senior Multivitamin    . Potassium 99 MG TABS Take 99 mg by mouth 2 (two) times daily.     . vitamin C (ASCORBIC ACID) 500 MG tablet Take 500 mg by mouth daily.     No current facility-administered medications for this visit.     OBJECTIVE: There were no vitals filed for this visit.   There is no height or weight on file to calculate BMI.    ECOG FS:0 - Asymptomatic  General: Well-developed, well-nourished, no acute distress. Eyes: Pink conjunctiva, anicteric sclera. Breasts: Patient declined breast exam today. Lungs: Clear to auscultation bilaterally. Heart: Regular rate and rhythm. No rubs, murmurs, or gallops. Abdomen: Soft, nontender, nondistended. No organomegaly noted, normoactive bowel sounds. Musculoskeletal: No edema, cyanosis, or clubbing. Neuro: Alert, answering all questions appropriately. Cranial nerves grossly intact. Skin: No rashes or petechiae noted. Psych: Normal affect.   LAB RESULTS:  No results found for: NA, K, CL, CO2, GLUCOSE, BUN, CREATININE, CALCIUM, PROT, ALBUMIN, AST, ALT, ALKPHOS, BILITOT, GFRNONAA, GFRAA  No results found for: WBC, NEUTROABS, HGB, HCT, MCV, PLT   STUDIES: Nm Sentinel Node Injection  Result Date: 04/23/2017 CLINICAL DATA:  Left breast cancer. EXAM: NUCLEAR MEDICINE BREAST LYMPHOSCINTIGRAPHY TECHNIQUE: Intradermal injection of radiopharmaceutical was performed at the 12  o'clock, 3 o'clock, 6 o'clock, and 9 o'clock positions around the left nipple. The patient was then sent to the operating room where the sentinel node(s) were identified and removed by the surgeon. RADIOPHARMACEUTICALS:  Total of 1 mCi Millipore-filtered Technetium-29msulfur colloid, injected in four aliquots of 0.25 mCi each. IMPRESSION: Uncomplicated intradermal injection of a total of 1 mCi Technetium-931mulfur colloid for purposes of sentinel node identification. Electronically Signed   By: MaInez Catalina.D.   On: 04/23/2017 12:11   Mm Breast Surgical Specimen  Result Date: 04/23/2017 CLINICAL DATA:  Specimen radiograph evaluation. EXAM: SPECIMEN RADIOGRAPH OF THE LEFT BREAST COMPARISON:  Previous exam(s). FINDINGS: Status post excision of the mass containing the heart shaped clip. The heart shaped clip and mass are present within the specimen. IMPRESSION: Specimen radiograph of the left breast specimen. Electronically Signed   By: DaDorise BullionII M.D   On: 04/23/2017 17:12   UsKoreareast Complete Uni Left Inc Axilla  Result Date: 04/28/2017 Ultrasound examination of the wide excision site was completed to  determine if the patient would be a candidate for accelerated partial breast radiation as she had had completed on the right side. There is a cavity measuring 1.7 x 3.3 x 6.8 cm approximately 3 cm below the skin. Adequate volume for MammoSite placement.    US Breast Complete Uni Left Inc Axilla  Result Date: 04/17/2017 Ultrasound completed to determine a preoperative needle localization was required was completed on the left breast. In the upper outer quadrant at the 1:00 position, 8 cm from nipple and a proximally 2 cm deep and irregular hypoechoic mass to all of that was wide measuring 0.8 x 0.95 x 1.09 cm is identified. This corresponds to the biopsy site. [id number].    ASSESSMENT: Pathologic stage IB triple negative invasive carcinoma of the upper outer quadrant of the left  breast.  PLAN:    1. 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 This is likely a second primary. After lengthy discussion with the patient, she has agreed to adjuvant chemotherapy using Taxotere and Cytoxan with Neulasta support. Plan to give 4 cycles of every 3 weeks. Patient will also require adjuvant XRT. An aromatase inhibitor would not be of any benefit given the ER/PR status of her tumor. Patient will return to clinic on June 04, 2017 to initiate cycle 1 of 4 of Taxotere and Cytoxan.  2. 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 Oncotype score 17: Originally diagnosed in 2008. Patient completed 5 years of Arimidex in approximately August 2013. Biopsy of the right breast only revealed fat necrosis at the site of her previous MammoSite. 3. Genetic testing: Patient has a sister and mother both with breast cancer. This is also her second primary breast cancer, therefore will send for BRCA 1 and 2.  Approximately 30 minutes was spent in discussion of which greater than 50% was consultation.  Patient expressed understanding and was in agreement with this plan. She also understands that She can call clinic at any time with any questions, concerns, or complaints.   Cancer Staging Carcinoma of upper-outer quadrant of left breast in female, estrogen receptor negative ([place]) Staging form: Breast, AJCC 8th Edition - Clinical stage from 04/08/2017: Stage IB (cT1c, cN0, cM0, G3, ER: Negative, PR: Negative, HER2: Negative) - Signed by Lloyd Huger, MD on 04/08/2017 - Pathologic stage from 05/11/2017: Stage IB (pT1c, pN0, cM0, G3, ER: Negative, PR: Negative, HER2: Negative) - Signed by Lloyd Huger, MD on 05/11/2017   Lloyd Huger, MD   05/11/2017 7:19 PM

## 2017-05-05 ENCOUNTER — Ambulatory Visit
Admission: RE | Admit: 2017-05-05 | Discharge: 2017-05-05 | Disposition: A | Discharge status: Home / Self Care | Payer: Medicare PPO | Source: Ambulatory Visit | Attending: Radiation Oncology | Admitting: Radiation Oncology

## 2017-05-05 ENCOUNTER — Inpatient Hospital Stay: Payer: Medicare PPO | Attending: Oncology | Admitting: Oncology

## 2017-05-05 ENCOUNTER — Telehealth: Payer: Self-pay | Admitting: *Deleted

## 2017-05-05 ENCOUNTER — Encounter: Payer: Self-pay | Admitting: Radiation Oncology

## 2017-05-05 VITALS — BP 140/86 | HR 82 | Temp 98.8°F | Resp 18 | Wt 189.2 lb

## 2017-05-05 DIAGNOSIS — Z801 Family history of malignant neoplasm of trachea, bronchus and lung: Secondary | ICD-10-CM | POA: Diagnosis not present

## 2017-05-05 DIAGNOSIS — Z79899 Other long term (current) drug therapy: Secondary | ICD-10-CM

## 2017-05-05 DIAGNOSIS — Z87891 Personal history of nicotine dependence: Secondary | ICD-10-CM | POA: Insufficient documentation

## 2017-05-05 DIAGNOSIS — M48061 Spinal stenosis, lumbar region without neurogenic claudication: Secondary | ICD-10-CM | POA: Insufficient documentation

## 2017-05-05 DIAGNOSIS — Z510 Encounter for antineoplastic radiation therapy: Secondary | ICD-10-CM | POA: Diagnosis present

## 2017-05-05 DIAGNOSIS — Z171 Estrogen receptor negative status [ER-]: Secondary | ICD-10-CM | POA: Insufficient documentation

## 2017-05-05 DIAGNOSIS — K219 Gastro-esophageal reflux disease without esophagitis: Secondary | ICD-10-CM | POA: Insufficient documentation

## 2017-05-05 DIAGNOSIS — Z7189 Other specified counseling: Secondary | ICD-10-CM

## 2017-05-05 DIAGNOSIS — M5416 Radiculopathy, lumbar region: Secondary | ICD-10-CM | POA: Insufficient documentation

## 2017-05-05 DIAGNOSIS — C50412 Malignant neoplasm of upper-outer quadrant of left female breast: Secondary | ICD-10-CM | POA: Diagnosis present

## 2017-05-05 DIAGNOSIS — Z803 Family history of malignant neoplasm of breast: Secondary | ICD-10-CM | POA: Insufficient documentation

## 2017-05-05 DIAGNOSIS — R011 Cardiac murmur, unspecified: Secondary | ICD-10-CM | POA: Diagnosis not present

## 2017-05-05 DIAGNOSIS — M129 Arthropathy, unspecified: Secondary | ICD-10-CM | POA: Insufficient documentation

## 2017-05-05 MED ORDER — CEFADROXIL 500 MG PO CAPS: 500.0000 mg | ORAL_CAPSULE | Freq: Two times a day (BID) | ORAL | 0 refills | Status: DC

## 2017-05-05 NOTE — Telephone Encounter (Signed)
Mammosite schedule reviewed with the patient Placement  05-18-17  at [facility] at 9:30 Scan 05-19-17 Treat June 6,7, 11, 12, 13 Aware the [place] will be calling her for more details Aware of ATB and directions reviewed. Aware no showers and to wear her bra while mammosite in place.

## 2017-05-05 NOTE — Consult Note (Signed)
NEW PATIENT EVALUATION  Name: Megan Santana  MRN: [id number]  Date:   05/05/2017     DOB: 02-06-1934   This 83 y.o. female patient presents to the clinic for initial evaluation of stage I (T1 CN 0 M0 ER/PR negative HER-2/neu equivocal status post wide local excision and sentinel node biopsy.  REFERRING PHYSICIAN: Lynnell Jude, MD  CHIEF COMPLAINT:  Chief Complaint  Patient presents with  . Breast Cancer    Pt is here for initial consultation of breast cancer.      DIAGNOSIS: The encounter diagnosis was Malignant neoplasm of upper-outer quadrant of left breast in female, estrogen receptor negative ([place]).   PREVIOUS INVESTIGATIONS:  Mammograms and ultrasound reviewed Pathology reports reviewed Clinical notes reviewed  HPI: Patient is a 83 year old female previously treated 11 years prior to her right breast using accelerated partial breast radiation for stage I invasive mammary carcinoma. Patient was found on routine screening mammography to have a lesion of the left breast in the upper outer quadrant 1:00 position 8 cm from the nipple confirmed on ultrasound suspicious for malignancy. She underwent ultrasound-guided biopsy was which was positive for invasive mammary carcinoma. Tumor was ER/PR negative HER-2/neu 2+ equivocal. She underwent wide local excision and sentinel node biopsy. Tumor was 1.6 cm with margins clear but close at 1 mm from the anterior margin. Tumors nuclear grade 3. One sentinel lymph node was negative for metastatic disease. Her breast is somewhat sore she is been evaluated with ultrasound status post lumpectomy and is thought to be a candidate for accelerated partial breast irradiation. She is seeing medical oncology for opinion today.  PLANNED TREATMENT REGIMEN: Possible accelerated partial breast radiation  PAST MEDICAL HISTORY:  has a past medical history of Arthritis; Breast cancer ([place]) (2007); Breast cancer ([place]) (03/31/2017); Family history of adverse  reaction to anesthesia; GERD (gastroesophageal reflux disease); Heart murmur; Lumbar radiculitis; and Lumbar stenosis.    PAST SURGICAL HISTORY:  Past Surgical History:  Procedure Laterality Date  . ABDOMINAL HYSTERECTOMY     total  . BACK SURGERY     lumbar  . BREAST BIOPSY Bilateral 03/31/2017   bilat u/s core INVASIVE MAMMARY CARCINOMA  . BREAST BIOPSY Right 03/31/2017   DENSE FIBROSIS AND SHEETS OF MACROPHAGES  . BREAST LUMPECTOMY Right 2007  . BREAST LUMPECTOMY Left 04/23/2017  . BREAST LUMPECTOMY Left 04/23/2017   Procedure: BREAST LUMPECTOMY WITH EXCISION OF SENTINEL NODE;  Surgeon: Robert Bellow, MD;  Location: [facility] ORS;  Service: General;  Laterality: Left;  . BREAST SURGERY Right    masectomy  . EYE SURGERY     cataracts bil  . FRACTURE SURGERY Left 2015 or 2016  . TUMOR EXCISION  2001    FAMILY HISTORY: family history includes Breast cancer in her maternal aunt; Breast cancer (age of onset: 46) in her sister; Heart attack in her brother; Lung cancer (age of onset: 75) in her father; Lung cancer (age of onset: 85) in her mother.  SOCIAL HISTORY:  reports that she quit smoking about 48 years ago. Her smoking use included Cigarettes. She has a 2.50 pack-year smoking history. She has never used smokeless tobacco. She reports that she drinks alcohol. She reports that she does not use drugs.  ALLERGIES: Oxycodone  MEDICATIONS:  Current Outpatient Prescriptions  Medication Sig Dispense Refill  . acetaminophen (TYLENOL) 500 MG tablet Take 1,000 mg by mouth every 6 (six) hours as needed (for pain.).    Marland Kitchen Calcium Carb-Cholecalciferol (CALCIUM 600+D) 600-800 MG-UNIT TABS  Take 1 tablet by mouth 2 (two) times daily.    . calcium carbonate (TUMS EX) 750 MG chewable tablet Chew 1-2 tablets by mouth 3 (three) times daily as needed (for heartburn/indigestion).    . Cholecalciferol (VITAMIN D-3) 5000 units TABS Take 5,000 Units by mouth daily.    . magnesium oxide (MAG-OX) 400 MG  tablet Take 400 mg by mouth at bedtime.     . Multiple Vitamin (MULTIVITAMIN WITH MINERALS) TABS tablet Take 1 tablet by mouth daily. Senior Multivitamin    . Potassium 99 MG TABS Take 99 mg by mouth 2 (two) times daily.     . vitamin C (ASCORBIC ACID) 500 MG tablet Take 500 mg by mouth daily.    Marland Kitchen HYDROcodone-acetaminophen (NORCO) 5-325 MG tablet Take 1-2 tablets by mouth every 4 (four) hours as needed for moderate pain. (Patient not taking: Reported on 05/05/2017) 30 tablet 0   No current facility-administered medications for this encounter.     ECOG PERFORMANCE STATUS:  0 - Asymptomatic  REVIEW OF SYSTEMS:  Patient denies any weight loss, fatigue, weakness, fever, chills or night sweats. Patient denies any loss of vision, blurred vision. Patient denies any ringing  of the ears or hearing loss. No irregular heartbeat. Patient denies heart murmur or history of fainting. Patient denies any chest pain or pain radiating to her upper extremities. Patient denies any shortness of breath, difficulty breathing at night, cough or hemoptysis. Patient denies any swelling in the lower legs. Patient denies any nausea vomiting, vomiting of blood, or coffee ground material in the vomitus. Patient denies any stomach pain. Patient states has had normal bowel movements no significant constipation or diarrhea. Patient denies any dysuria, hematuria or significant nocturia. Patient denies any problems walking, swelling in the joints or loss of balance. Patient denies any skin changes, loss of hair or loss of weight. Patient denies any excessive worrying or anxiety or significant depression. Patient denies any problems with insomnia. Patient denies excessive thirst, polyuria, polydipsia. Patient denies any swollen glands, patient denies easy bruising or easy bleeding. Patient denies any recent infections, allergies or URI. Patient "s visual fields have not changed significantly in recent time.    PHYSICAL EXAM: BP 140/86    Pulse 82   Temp 98.8 F (37.1 C)   Resp 18   Wt 189 lb 2.5 oz (85.8 kg)   BMI 34.60 kg/m  Patient is status post wide local excision of the left breast which is healing well no dominant mass or nodularity is noted in either breast in 2 positions examined. No axillary or supraclavicular adenopathy is appreciated. Well-developed well-nourished patient in NAD. HEENT reveals PERLA, EOMI, discs not visualized.  Oral cavity is clear. No oral mucosal lesions are identified. Neck is clear without evidence of cervical or supraclavicular adenopathy. Lungs are clear to A&P. Cardiac examination is essentially unremarkable with regular rate and rhythm without murmur rub or thrill. Abdomen is benign with no organomegaly or masses noted. Motor sensory and DTR levels are equal and symmetric in the upper and lower extremities. Cranial nerves II through XII are grossly intact. Proprioception is intact. No peripheral adenopathy or edema is identified. No motor or sensory levels are noted. Crude visual fields are within normal range.  LABORATORY DATA: Pathology reports are reviewed    RADIOLOGY RESULTS: Mammograms and ultrasound reviewed   IMPRESSION: Stage I ER/PR negative HER-2/neu equivocal invasive mammary carcinoma the left breast status post wide local excision and 83 year old female  PLAN: At this time  believe patient would be a candidate again for accelerated partial breast radiation which we performed 11 years prior to the right breast. I would plan on delivering 3400 cGy in 10 fractions at 340 cGy twice a day. Risks and benefits of treatment including possible slight area of erythematous changes of the skin thickening of the lumpectomy site fatigue all were discussed in detail with the patient. We also discussed alternative plans should MammoSite catheter not be able to be placed or not suitable for treatment. Patient also will see medical oncology today if she is indeed a candidate for chemotherapy we can  probably have her radiation completed prior to initiation of chemotherapy. All of this was explained to the patient and her daughter. We will make arrangements for balloon placement and brachytherapy treatment planning.  I would like to take this opportunity to thank you for allowing me to participate in the care of your patient.Armstead Peaks., MD

## 2017-05-06 ENCOUNTER — Institutional Professional Consult (permissible substitution): Payer: Medicare PPO | Admitting: Radiation Oncology

## 2017-05-06 NOTE — Telephone Encounter (Signed)
Notified patient as instructed, patient pleased. Discussed follow-up appointments, patient agrees. She has had a mammosite in the past.

## 2017-05-11 DIAGNOSIS — Z7189 Other specified counseling: Secondary | ICD-10-CM | POA: Insufficient documentation

## 2017-05-11 MED ORDER — PROCHLORPERAZINE MALEATE 10 MG PO TABS: 10.0000 mg | ORAL_TABLET | Freq: Four times a day (QID) | ORAL | 2 refills | Status: DC | PRN

## 2017-05-11 MED ORDER — ONDANSETRON HCL 8 MG PO TABS: 8.0000 mg | ORAL_TABLET | Freq: Two times a day (BID) | ORAL | 2 refills | Status: DC | PRN

## 2017-05-11 NOTE — Progress Notes (Signed)
START ON PATHWAY REGIMEN - Breast     A cycle is every 21 days:     Docetaxel      Cyclophosphamide   **Always confirm dose/schedule in your pharmacy ordering system**    Patient Characteristics: Postoperative without Neoadjuvant Therapy (Pathologic Staging), Invasive Disease, Adjuvant Therapy, Node Negative, HER2 Negative/Unknown/Equivocal, ER Negative Therapeutic Status: Postoperative without Neoadjuvant Therapy (Pathologic Staging) AJCC Grade: G3 AJCC N Category: pN0 AJCC M Category: cM0 ER Status: Negative (-) AJCC 8 Stage Grouping: IB HER2 Status: Negative (-) Oncotype Dx Recurrence Score: Not Appropriate AJCC T Category: pT1c PR Status: Negative (-)  Intent of Therapy: Curative Intent, Discussed with Patient

## 2017-05-14 NOTE — Patient Instructions (Signed)

## 2017-05-18 ENCOUNTER — Inpatient Hospital Stay: Payer: Self-pay

## 2017-05-18 ENCOUNTER — Ambulatory Visit (INDEPENDENT_AMBULATORY_CARE_PROVIDER_SITE_OTHER): Payer: Medicare PPO | Admitting: General Surgery

## 2017-05-18 ENCOUNTER — Encounter: Payer: Self-pay | Admitting: General Surgery

## 2017-05-18 VITALS — BP 126/74 | HR 68 | Resp 12 | Ht 62.0 in | Wt 190.0 lb

## 2017-05-18 DIAGNOSIS — Z171 Estrogen receptor negative status [ER-]: Secondary | ICD-10-CM

## 2017-05-18 DIAGNOSIS — C50412 Malignant neoplasm of upper-outer quadrant of left female breast: Secondary | ICD-10-CM

## 2017-05-18 NOTE — Progress Notes (Signed)
Patient ID: Megan Santana, female   DOB: 1934/01/24, 83 y.o.   MRN: [id number]  Chief Complaint  Patient presents with  . Procedure    HPI Megan Santana is a 83 y.o. female here today for a left mammosite placement. The patient was evaluated by radiation oncology and felt to be a candidate for accelerated partial breast radiation. The patient is familiar with the procedure from her contralateral treatment 10+ years ago. HPI  Past Medical History:  Diagnosis Date  . Arthritis   . Breast cancer [facility]) 2007   right lumpectomy  . Breast cancer ([place]) 03/31/2017   left breast / INVASIVE MAMMARY CARCINOMA grade 3  . Family history of adverse reaction to anesthesia    mom n/v  . GERD (gastroesophageal reflux disease)   . Heart murmur   . Lumbar radiculitis   . Lumbar stenosis     Past Surgical History:  Procedure Laterality Date  . ABDOMINAL HYSTERECTOMY     total  . BACK SURGERY     lumbar  . BREAST BIOPSY Bilateral 03/31/2017   bilat u/s core INVASIVE MAMMARY CARCINOMA  . BREAST BIOPSY Right 03/31/2017   DENSE FIBROSIS AND SHEETS OF MACROPHAGES  . BREAST LUMPECTOMY Right 2007  . BREAST LUMPECTOMY Left 04/23/2017  . BREAST LUMPECTOMY Left 04/23/2017   Procedure: BREAST LUMPECTOMY WITH EXCISION OF SENTINEL NODE;  Surgeon: Robert Bellow, MD;  Location: [facility] ORS;  Service: General;  Laterality: Left;  . BREAST SURGERY Right    masectomy  . EYE SURGERY     cataracts bil  . FRACTURE SURGERY Left 2015 or 2016  . TUMOR EXCISION  2001    Family History  Problem Relation Age of Onset  . Breast cancer Sister 22  . Lung cancer Mother 41       mets to breast  . Lung cancer Father 76  . Heart attack Brother   . Breast cancer Maternal Aunt        age unknown    Social History Social History  Substance Use Topics  . Smoking status: Former Smoker    Packs/day: 0.25    Years: 10.00    Types: Cigarettes    Quit date: 9  . Smokeless tobacco: Never Used  . Alcohol use  Yes     Comment: occasionally    Allergies  Allergen Reactions  . Oxycodone Other (See Comments)    Can't sleep, feels bad when taking.    Current Outpatient Prescriptions  Medication Sig Dispense Refill  . acetaminophen (TYLENOL) 500 MG tablet Take 1,000 mg by mouth every 6 (six) hours as needed (for pain.).    Marland Kitchen Calcium Carb-Cholecalciferol (CALCIUM 600+D) 600-800 MG-UNIT TABS Take 1 tablet by mouth 2 (two) times daily.    . calcium carbonate (TUMS EX) 750 MG chewable tablet Chew 1-2 tablets by mouth 3 (three) times daily as needed (for heartburn/indigestion).    . cefadroxil (DURICEF) 500 MG capsule Take 1 capsule (500 mg total) by mouth 2 (two) times daily. Start one hour prior to office procedure on 05-18-17 20 capsule 0  . Cholecalciferol (VITAMIN D-3) 5000 units TABS Take 5,000 Units by mouth daily.    Marland Kitchen HYDROcodone-acetaminophen (NORCO) 5-325 MG tablet Take 1-2 tablets by mouth every 4 (four) hours as needed for moderate pain. 30 tablet 0  . magnesium oxide (MAG-OX) 400 MG tablet Take 400 mg by mouth at bedtime.     . Multiple Vitamin (MULTIVITAMIN WITH MINERALS) TABS tablet Take 1 tablet  by mouth daily. Senior Multivitamin    . ondansetron (ZOFRAN) 8 MG tablet Take 1 tablet (8 mg total) by mouth 2 (two) times daily as needed for refractory nausea / vomiting. Start on day 3 after chemo. 60 tablet 2  . Potassium 99 MG TABS Take 99 mg by mouth 2 (two) times daily.     . prochlorperazine (COMPAZINE) 10 MG tablet Take 1 tablet (10 mg total) by mouth every 6 (six) hours as needed (Nausea or vomiting). 60 tablet 2  . vitamin C (ASCORBIC ACID) 500 MG tablet Take 500 mg by mouth daily.     No current facility-administered medications for this visit.     Review of Systems Review of Systems  Constitutional: Negative.   Respiratory: Negative.   Cardiovascular: Negative.     Blood pressure 126/74, pulse 68, resp. rate 12, height 5\' 2"  (1.575 m), weight 190 lb (86.2 kg).  Physical  Exam Physical Exam  Pulmonary/Chest:      Data Reviewed Ultrasound examination of the left breast showed a well-defined cavity a proximally 1.9 cm below the skin measuring 1.6 x 2.8 x 7.4 cm.  The patient had made use of Duricef prior to the procedure as requested.  The breast was prepped with alcohol followed by 10 mL of 0.5% Xylocaine with 0.25% Marcaine with 1-200,000 of epinephrine. This was supplement with 5 mL of 1% plain Xylocaine. The area was prepped with ChloraPrep and draped. An incision about 5 cm in the lateral edge of the wide excision site was made followed by passage of the 8 mm trocar. The biopsy cavity drained 35 mL of odorless, serosanguineous fluid. The cavity evaluation device which had previously been distended and found to be symmetrical was inserted into the cavity and inflated to 55 mL volume with good apposition to the walls and good spherical preservation. This was removed and a treatment balloon placed, after assuring symmetry had a 70 mL inflation. This was inflated to 55 mL with a mixture of saline and Omnipaque. Symmetrical insufflation was noted with a minimum spacing of 1.87 cm to the skin. Measures from the central port of the balloon cavity showed a 1.87-2.02 cm when visualized longitudinally and 2.02-2.20 centimeters when viewed  tangentially. Bacitracin ointment was applied to the balloon exit site. A gauze dressing was placed and the patient's bra used for support.  The procedure was well tolerated.   Assessment    triple negative left breast cancer, MammoSite balloon placement.    Plan    wound care instructions were reviewed with the patient and her daughter. Dressing supplies provided. She is scheduled undergo simulation tomorrow she'll continue her course of antibiotics until the balloon is removed.     Follow up in two weeks.  HPI, Physical Exam, Assessment and Plan have been scribed under the direction and in the presence of Robert Bellow,  MD.  Verlene Mayer, CMA  I have completed the exam and reviewed the above documentation for accuracy and completeness.  I agree with the above.  Haematologist has been used and any errors in dictation or transcription are unintentional.  Hervey Ard, M.D., F.A.C.S.   Robert Bellow 05/18/2017, 10:21 AM

## 2017-05-19 ENCOUNTER — Ambulatory Visit
Admission: RE | Admit: 2017-05-19 | Discharge: 2017-05-19 | Disposition: A | Discharge status: Home / Self Care | Payer: Medicare PPO | Source: Ambulatory Visit | Attending: Radiation Oncology | Admitting: Radiation Oncology

## 2017-05-19 ENCOUNTER — Inpatient Hospital Stay: Payer: Medicare PPO | Attending: Oncology

## 2017-05-19 DIAGNOSIS — Z171 Estrogen receptor negative status [ER-]: Secondary | ICD-10-CM | POA: Insufficient documentation

## 2017-05-19 DIAGNOSIS — Z79899 Other long term (current) drug therapy: Secondary | ICD-10-CM | POA: Insufficient documentation

## 2017-05-19 DIAGNOSIS — Z510 Encounter for antineoplastic radiation therapy: Secondary | ICD-10-CM | POA: Diagnosis not present

## 2017-05-19 DIAGNOSIS — C50412 Malignant neoplasm of upper-outer quadrant of left female breast: Secondary | ICD-10-CM | POA: Insufficient documentation

## 2017-05-19 DIAGNOSIS — Z5111 Encounter for antineoplastic chemotherapy: Secondary | ICD-10-CM | POA: Insufficient documentation

## 2017-05-19 DIAGNOSIS — Z87891 Personal history of nicotine dependence: Secondary | ICD-10-CM | POA: Insufficient documentation

## 2017-05-19 DIAGNOSIS — M48061 Spinal stenosis, lumbar region without neurogenic claudication: Secondary | ICD-10-CM | POA: Insufficient documentation

## 2017-05-19 DIAGNOSIS — Z803 Family history of malignant neoplasm of breast: Secondary | ICD-10-CM | POA: Insufficient documentation

## 2017-05-19 DIAGNOSIS — Z801 Family history of malignant neoplasm of trachea, bronchus and lung: Secondary | ICD-10-CM | POA: Insufficient documentation

## 2017-05-19 DIAGNOSIS — F419 Anxiety disorder, unspecified: Secondary | ICD-10-CM | POA: Insufficient documentation

## 2017-05-19 DIAGNOSIS — K219 Gastro-esophageal reflux disease without esophagitis: Secondary | ICD-10-CM | POA: Insufficient documentation

## 2017-05-20 ENCOUNTER — Ambulatory Visit
Admission: RE | Admit: 2017-05-20 | Discharge: 2017-05-20 | Disposition: A | Discharge status: Home / Self Care | Payer: Medicare PPO | Source: Ambulatory Visit | Attending: Radiation Oncology | Admitting: Radiation Oncology

## 2017-05-20 DIAGNOSIS — Z510 Encounter for antineoplastic radiation therapy: Secondary | ICD-10-CM | POA: Diagnosis not present

## 2017-05-21 ENCOUNTER — Ambulatory Visit
Admission: RE | Admit: 2017-05-21 | Discharge: 2017-05-21 | Disposition: A | Discharge status: Home / Self Care | Payer: Medicare PPO | Source: Ambulatory Visit | Attending: Radiation Oncology | Admitting: Radiation Oncology

## 2017-05-21 DIAGNOSIS — Z510 Encounter for antineoplastic radiation therapy: Secondary | ICD-10-CM | POA: Diagnosis not present

## 2017-05-25 ENCOUNTER — Ambulatory Visit
Admission: RE | Admit: 2017-05-25 | Discharge: 2017-05-25 | Disposition: A | Discharge status: Home / Self Care | Payer: Medicare PPO | Source: Ambulatory Visit | Attending: Radiation Oncology | Admitting: Radiation Oncology

## 2017-05-25 DIAGNOSIS — Z510 Encounter for antineoplastic radiation therapy: Secondary | ICD-10-CM | POA: Diagnosis not present

## 2017-05-26 ENCOUNTER — Ambulatory Visit
Admission: RE | Admit: 2017-05-26 | Discharge: 2017-05-26 | Disposition: A | Discharge status: Home / Self Care | Payer: Medicare PPO | Source: Ambulatory Visit | Attending: Radiation Oncology | Admitting: Radiation Oncology

## 2017-05-26 DIAGNOSIS — Z510 Encounter for antineoplastic radiation therapy: Secondary | ICD-10-CM | POA: Diagnosis not present

## 2017-05-27 ENCOUNTER — Ambulatory Visit
Admission: RE | Admit: 2017-05-27 | Discharge: 2017-05-27 | Disposition: A | Discharge status: Home / Self Care | Payer: Medicare PPO | Source: Ambulatory Visit | Attending: Radiation Oncology | Admitting: Radiation Oncology

## 2017-05-27 DIAGNOSIS — Z510 Encounter for antineoplastic radiation therapy: Secondary | ICD-10-CM | POA: Diagnosis not present

## 2017-05-28 ENCOUNTER — Encounter: Payer: Self-pay | Admitting: Oncology

## 2017-06-03 NOTE — Progress Notes (Signed)
[facility]  Telephone:([phone]) [phone] Fax:(336) [phone]  ID: Megan Santana OB: [birth year]  MR#: [id number]  MGQ#:[id number]  Patient Care Team: Lynnell Jude, MD as PCP - General (Family Medicine) Clemmie Krill Lynnell Jude, MD as Referring Physician (Family Medicine) Bary Castilla Forest Gleason, MD (General Surgery)  CHIEF COMPLAINT: Pathologic stage IB triple negative invasive carcinoma of the upper outer quadrant of the left breast.  INTERVAL HISTORY: Patient returns to clinic today for further evaluation and initiation of cycle 1 of 4 of Taxotere and Cytoxan. She is anxious, but otherwise feels well. She currently feels well and is asymptomatic. She has no neurologic complaints. She denies any recent fevers or illnesses. She has a good appetite and denies weight loss. She has no chest pain or shortness of breath. She denies any nausea, vomiting, constipation, or diarrhea. She has no urinary complaints. Patient offers no further specific complaints today.  REVIEW OF SYSTEMS:   Review of Systems  Constitutional: Negative.  Negative for fever, malaise/fatigue and weight loss.  Respiratory: Negative.  Negative for cough and shortness of breath.   Cardiovascular: Negative.  Negative for chest pain and leg swelling.  Gastrointestinal: Negative.  Negative for abdominal pain.  Genitourinary: Negative.   Musculoskeletal: Negative.   Skin: Negative.  Negative for rash.  Neurological: Negative.  Negative for sensory change and weakness.  Psychiatric/Behavioral: The patient is nervous/anxious.     As per HPI. Otherwise, a complete review of systems is negative.  PAST MEDICAL HISTORY: Past Medical History:  Diagnosis Date  . Arthritis   . Breast cancer [facility]) 2007   right lumpectomy  . Breast cancer ([place]) 03/31/2017   left breast / INVASIVE MAMMARY CARCINOMA grade 3  . Family history of adverse reaction to anesthesia    mom n/v  . GERD (gastroesophageal reflux disease)   . Heart  murmur   . Lumbar radiculitis   . Lumbar stenosis     PAST SURGICAL HISTORY: Past Surgical History:  Procedure Laterality Date  . ABDOMINAL HYSTERECTOMY     total  . BACK SURGERY     lumbar  . BREAST BIOPSY Bilateral 03/31/2017   bilat u/s core INVASIVE MAMMARY CARCINOMA  . BREAST BIOPSY Right 03/31/2017   DENSE FIBROSIS AND SHEETS OF MACROPHAGES  . BREAST LUMPECTOMY Right 2007  . BREAST LUMPECTOMY Left 04/23/2017  . BREAST LUMPECTOMY Left 04/23/2017   Procedure: BREAST LUMPECTOMY WITH EXCISION OF SENTINEL NODE;  Surgeon: Robert Bellow, MD;  Location: [facility] ORS;  Service: General;  Laterality: Left;  . BREAST SURGERY Right    masectomy  . EYE SURGERY     cataracts bil  . FRACTURE SURGERY Left 2015 or 2016  . TUMOR EXCISION  2001    FAMILY HISTORY: Family History  Problem Relation Age of Onset  . Breast cancer Sister 26  . Lung cancer Mother 52       mets to breast  . Lung cancer Father 55  . Heart attack Brother   . Breast cancer Maternal Aunt        age unknown    ADVANCED DIRECTIVES (Y/N):  N  HEALTH MAINTENANCE: Social History  Substance Use Topics  . Smoking status: Former Smoker    Packs/day: 0.25    Years: 10.00    Types: Cigarettes    Quit date: 75  . Smokeless tobacco: Never Used  . Alcohol use Yes     Comment: occasionally     Colonoscopy:  PAP:  Bone density:  Lipid panel:  Allergies  Allergen Reactions  . Oxycodone Other (See Comments)    Can't sleep, feels bad when taking.    Current Outpatient Prescriptions  Medication Sig Dispense Refill  . acetaminophen (TYLENOL) 500 MG tablet Take 1,000 mg by mouth every 6 (six) hours as needed (for pain.).    Marland Kitchen Calcium Carb-Cholecalciferol (CALCIUM 600+D) 600-800 MG-UNIT TABS Take 1 tablet by mouth 2 (two) times daily.    . calcium carbonate (TUMS EX) 750 MG chewable tablet Chew 1-2 tablets by mouth 3 (three) times daily as needed (for heartburn/indigestion).    . Cholecalciferol (VITAMIN  D-3) 5000 units TABS Take 5,000 Units by mouth daily.    Marland Kitchen HYDROcodone-acetaminophen (NORCO) 5-325 MG tablet Take 1-2 tablets by mouth every 4 (four) hours as needed for moderate pain. 30 tablet 0  . magnesium oxide (MAG-OX) 400 MG tablet Take 400 mg by mouth at bedtime.     . Multiple Vitamin (MULTIVITAMIN WITH MINERALS) TABS tablet Take 1 tablet by mouth daily. Senior Multivitamin    . ondansetron (ZOFRAN) 8 MG tablet Take 1 tablet (8 mg total) by mouth 2 (two) times daily as needed for refractory nausea / vomiting. Start on day 3 after chemo. 60 tablet 2  . Potassium 99 MG TABS Take 99 mg by mouth 2 (two) times daily.     . prochlorperazine (COMPAZINE) 10 MG tablet Take 1 tablet (10 mg total) by mouth every 6 (six) hours as needed (Nausea or vomiting). 60 tablet 2  . vitamin C (ASCORBIC ACID) 500 MG tablet Take 500 mg by mouth daily.    Marland Kitchen ALPRAZolam (XANAX) 0.25 MG tablet Take 1 tablet (0.25 mg total) by mouth as needed for anxiety. 30 tablet 0  . cefadroxil (DURICEF) 500 MG capsule Take 1 capsule (500 mg total) by mouth 2 (two) times daily. Start one hour prior to office procedure on 05-18-17 (Patient not taking: Reported on 06/04/2017) 20 capsule 0   No current facility-administered medications for this visit.     OBJECTIVE: Vitals:   06/04/17 0848  BP: (!) 152/73  Pulse: 74  Resp: 20  Temp: 97.7 F (36.5 C)     Body mass index is 34.7 kg/m.    ECOG FS:0 - Asymptomatic  General: Well-developed, well-nourished, no acute distress. Eyes: Pink conjunctiva, anicteric sclera. Breasts: Patient declined breast exam today. Lungs: Clear to auscultation bilaterally. Heart: Regular rate and rhythm. No rubs, murmurs, or gallops. Abdomen: Soft, nontender, nondistended. No organomegaly noted, normoactive bowel sounds. Musculoskeletal: No edema, cyanosis, or clubbing. Neuro: Alert, answering all questions appropriately. Cranial nerves grossly intact. Skin: No rashes or petechiae noted. Psych:  Normal affect.   LAB RESULTS:  Lab Results  Component Value Date   NA 138 06/04/2017   K 4.6 06/04/2017   CL 109 06/04/2017   CO2 24 06/04/2017   GLUCOSE 97 06/04/2017   BUN 15 06/04/2017   CREATININE 0.79 06/04/2017   CALCIUM 9.3 06/04/2017   PROT 6.9 06/04/2017   ALBUMIN 4.0 06/04/2017   AST 24 06/04/2017   ALT 21 06/04/2017   ALKPHOS 59 06/04/2017   BILITOT 0.6 06/04/2017   GFRNONAA >60 06/04/2017   GFRAA >60 06/04/2017    Lab Results  Component Value Date   WBC 9.6 06/04/2017   NEUTROABS 4.1 06/04/2017   HGB 12.4 06/04/2017   HCT 35.4 06/04/2017   MCV 85.2 06/04/2017   PLT 207 06/04/2017     STUDIES: US Breast Complete Uni Left Inc Axilla  Result Date: 05/29/2017 Please refer  to "Notes" to see consult details.   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  PLAN:    1. 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 This is likely a second primary. After lengthy discussion with the patient, she has agreed to adjuvant chemotherapy using Taxotere and Cytoxan with Neulasta support. Plan to give 4 cycles of every 3 weeks. Patient will also require adjuvant XRT at the conclusion of her chemotherapy. An aromatase inhibitor would not be of any benefit given the ER/PR status of her tumor. Proceed with cycle 1 of 4 of Taxotere and Cytoxan today. Return to clinic in 1 week for laboratory work and further evaluation and then in 3 weeks for consideration of cycle 2.  2. 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 Oncotype score 17: Originally diagnosed in 2008. Patient completed 5 years of Arimidex in approximately August 2013. Biopsy of the right breast only revealed fat necrosis at the site of her previous  MammoSite. 3. Genetic testing: Patient has a sister and mother both with breast cancer. This is also her second primary breast cancer, therefore will send for BRCA 1 and 2.  Approximately 30 minutes was spent in discussion of which greater than 50% was consultation.  Patient expressed understanding and was in agreement with this plan. She also understands that She can call clinic at any time with any questions, concerns, or complaints.   Cancer Staging Carcinoma of upper-outer quadrant of left breast in female, estrogen receptor negative ([place]) Staging form: Breast, AJCC 8th Edition - Clinical stage from 04/08/2017: Stage IB (cT1c, cN0, cM0, G3, ER: Negative, PR: Negative, HER2: Negative) - Signed by Lloyd Huger, MD on 04/08/2017 - Pathologic stage from 05/11/2017: Stage IB (pT1c, pN0, cM0, G3, ER: Negative, PR: Negative, HER2: Negative) - Signed by Lloyd Huger, MD on 05/11/2017   Lloyd Huger, MD   06/07/2017 8:03 AM

## 2017-06-04 ENCOUNTER — Inpatient Hospital Stay: Payer: Medicare PPO

## 2017-06-04 ENCOUNTER — Inpatient Hospital Stay (HOSPITAL_BASED_OUTPATIENT_CLINIC_OR_DEPARTMENT_OTHER): Payer: Medicare PPO | Admitting: Oncology

## 2017-06-04 VITALS — BP 118/70 | HR 71

## 2017-06-04 VITALS — BP 152/73 | HR 74 | Temp 97.7°F | Resp 20 | Wt 189.7 lb

## 2017-06-04 DIAGNOSIS — Z171 Estrogen receptor negative status [ER-]: Secondary | ICD-10-CM

## 2017-06-04 DIAGNOSIS — Z5111 Encounter for antineoplastic chemotherapy: Secondary | ICD-10-CM | POA: Diagnosis not present

## 2017-06-04 DIAGNOSIS — F419 Anxiety disorder, unspecified: Secondary | ICD-10-CM | POA: Diagnosis not present

## 2017-06-04 DIAGNOSIS — K219 Gastro-esophageal reflux disease without esophagitis: Secondary | ICD-10-CM | POA: Diagnosis not present

## 2017-06-04 DIAGNOSIS — Z79899 Other long term (current) drug therapy: Secondary | ICD-10-CM | POA: Diagnosis not present

## 2017-06-04 DIAGNOSIS — C50412 Malignant neoplasm of upper-outer quadrant of left female breast: Secondary | ICD-10-CM | POA: Diagnosis not present

## 2017-06-04 DIAGNOSIS — Z803 Family history of malignant neoplasm of breast: Secondary | ICD-10-CM | POA: Diagnosis not present

## 2017-06-04 DIAGNOSIS — M48061 Spinal stenosis, lumbar region without neurogenic claudication: Secondary | ICD-10-CM | POA: Diagnosis not present

## 2017-06-04 DIAGNOSIS — Z87891 Personal history of nicotine dependence: Secondary | ICD-10-CM | POA: Diagnosis not present

## 2017-06-04 DIAGNOSIS — Z801 Family history of malignant neoplasm of trachea, bronchus and lung: Secondary | ICD-10-CM

## 2017-06-04 LAB — COMPREHENSIVE METABOLIC PANEL
ALBUMIN: 4 g/dL (ref 3.5–5.0)
ALK PHOS: 59 U/L (ref 38–126)
ALT: 21 U/L (ref 14–54)
AST: 24 U/L (ref 15–41)
Anion gap: 5 (ref 5–15)
BUN: 15 mg/dL (ref 6–20)
CHLORIDE: 109 mmol/L (ref 101–111)
CO2: 24 mmol/L (ref 22–32)
CREATININE: 0.79 mg/dL (ref 0.44–1.00)
Calcium: 9.3 mg/dL (ref 8.9–10.3)
GFR calc non Af Amer: >60 mL/min (ref >60)
GLUCOSE: 97 mg/dL (ref 65–99)
Potassium: 4.6 mmol/L (ref 3.5–5.1)
SODIUM: 138 mmol/L (ref 135–145)
Total Bilirubin: 0.6 mg/dL (ref 0.3–1.2)
Total Protein: 6.9 g/dL (ref 6.5–8.1)

## 2017-06-04 LAB — CBC WITH DIFFERENTIAL/PLATELET
BASOS ABS: 0.1 10*3/uL (ref 0–0.1)
BASOS PCT: 1 %
EOS ABS: 0.3 10*3/uL (ref 0–0.7)
EOS PCT: 3 %
HCT: 35.4 % (ref 35.0–47.0)
HEMOGLOBIN: 12.4 g/dL (ref 12.0–16.0)
Lymphocytes Relative: 44 %
Lymphs Abs: 4.2 10*3/uL — ABNORMAL HIGH (ref 1.0–3.6)
MCH: 29.8 pg (ref 26.0–34.0)
MCHC: 35 g/dL (ref 32.0–36.0)
MCV: 85.2 fL (ref 80.0–100.0)
Monocytes Absolute: 0.9 10*3/uL (ref 0.2–0.9)
Monocytes Relative: 9 %
NEUTROS PCT: 43 %
Neutro Abs: 4.1 10*3/uL (ref 1.4–6.5)
PLATELETS: 207 10*3/uL (ref 150–440)
RBC: 4.16 MIL/uL (ref 3.80–5.20)
RDW: 13 % (ref 11.5–14.5)
WBC: 9.6 10*3/uL (ref 3.6–11.0)

## 2017-06-04 MED ORDER — SODIUM CHLORIDE 0.9 % IV SOLN
600.0000 mg/m2 | Freq: Once | INTRAVENOUS | Status: AC
  Administered 2017-06-04: 1160 mg via INTRAVENOUS
  Filled 2017-06-04: qty 50

## 2017-06-04 MED ORDER — ALPRAZOLAM 0.25 MG PO TABS: 0.2500 mg | ORAL_TABLET | ORAL | 0 refills | Status: DC | PRN

## 2017-06-04 MED ORDER — DEXAMETHASONE SODIUM PHOSPHATE 10 MG/ML IJ SOLN
10.0000 mg | Freq: Once | INTRAMUSCULAR | Status: AC
  Administered 2017-06-04: 10 mg via INTRAVENOUS
  Filled 2017-06-04: qty 1

## 2017-06-04 MED ORDER — PALONOSETRON HCL INJECTION 0.25 MG/5ML
0.2500 mg | Freq: Once | INTRAVENOUS | Status: AC
  Administered 2017-06-04: 0.25 mg via INTRAVENOUS
  Filled 2017-06-04: qty 5

## 2017-06-04 MED ORDER — SODIUM CHLORIDE 0.9 % IV SOLN
75.0000 mg/m2 | Freq: Once | INTRAVENOUS | Status: AC
  Administered 2017-06-04: 150 mg via INTRAVENOUS
  Filled 2017-06-04: qty 15

## 2017-06-04 MED ORDER — SODIUM CHLORIDE 0.9 % IV SOLN: 10.0000 mg | Freq: Once | INTRAVENOUS | Status: DC

## 2017-06-04 MED ORDER — SODIUM CHLORIDE 0.9 % IV SOLN
Freq: Once | INTRAVENOUS | Status: AC
  Administered 2017-06-04: 10:00:00 via INTRAVENOUS
  Filled 2017-06-04: qty 1000

## 2017-06-04 NOTE — Progress Notes (Signed)
Patient denies any concerns today.  

## 2017-06-09 ENCOUNTER — Ambulatory Visit (INDEPENDENT_AMBULATORY_CARE_PROVIDER_SITE_OTHER): Payer: Medicare PPO | Admitting: General Surgery

## 2017-06-09 ENCOUNTER — Encounter: Payer: Self-pay | Admitting: General Surgery

## 2017-06-09 VITALS — BP 130/70 | HR 66 | Resp 12 | Ht 62.0 in | Wt 186.0 lb

## 2017-06-09 DIAGNOSIS — C50412 Malignant neoplasm of upper-outer quadrant of left female breast: Secondary | ICD-10-CM

## 2017-06-09 DIAGNOSIS — Z171 Estrogen receptor negative status [ER-]: Secondary | ICD-10-CM

## 2017-06-09 NOTE — Patient Instructions (Addendum)
Patient to return in four months. 

## 2017-06-09 NOTE — Progress Notes (Signed)
Patient ID: Megan Santana, female   DOB: 1934-01-25, 83 y.o.   MRN: [id number]  Chief Complaint  Patient presents with  . Follow-up    HPI Megan Santana is a 83 y.o. female here today for her two week follow up mammosite placement done on 6/4/ 2018. Started chemo on 06/04/2017. HPI  Past Medical History:  Diagnosis Date  . Arthritis   . Breast cancer [facility]) 2007   right lumpectomy  . Breast cancer ([place]) 03/31/2017   16 mm invasive mammary carcinoma, left upper outer quadrant, T1c, N0, triple negative, histologic grade 3.  . Family history of adverse reaction to anesthesia    mom n/v  . GERD (gastroesophageal reflux disease)   . Heart murmur   . Lumbar radiculitis   . Lumbar stenosis     Past Surgical History:  Procedure Laterality Date  . ABDOMINAL HYSTERECTOMY     total  . BACK SURGERY     lumbar  . BREAST BIOPSY Bilateral 03/31/2017   bilat u/s core INVASIVE MAMMARY CARCINOMA  . BREAST BIOPSY Right 03/31/2017   DENSE FIBROSIS AND SHEETS OF MACROPHAGES  . BREAST LUMPECTOMY Right 2007  . BREAST LUMPECTOMY Left 04/23/2017  . BREAST LUMPECTOMY Left 04/23/2017   Procedure: BREAST LUMPECTOMY WITH EXCISION OF SENTINEL NODE;  Surgeon: Robert Bellow, MD;  Location: [facility] ORS;  Service: General;  Laterality: Left;  . BREAST SURGERY Right    masectomy  . EYE SURGERY     cataracts bil  . FRACTURE SURGERY Left 2015 or 2016  . TUMOR EXCISION  2001    Family History  Problem Relation Age of Onset  . Breast cancer Sister 83  . Lung cancer Mother 40       mets to breast  . Lung cancer Father 9  . Heart attack Brother   . Breast cancer Maternal Aunt        age unknown    Social History Social History  Substance Use Topics  . Smoking status: Former Smoker    Packs/day: 0.25    Years: 10.00    Types: Cigarettes    Quit date: 45  . Smokeless tobacco: Never Used  . Alcohol use Yes     Comment: occasionally    Allergies  Allergen Reactions  . Oxycodone Other  (See Comments)    Can't sleep, feels bad when taking.    Current Outpatient Prescriptions  Medication Sig Dispense Refill  . acetaminophen (TYLENOL) 500 MG tablet Take 1,000 mg by mouth every 6 (six) hours as needed (for pain.).    Marland Kitchen ALPRAZolam (XANAX) 0.25 MG tablet Take 1 tablet (0.25 mg total) by mouth as needed for anxiety. 30 tablet 0  . Calcium Carb-Cholecalciferol (CALCIUM 600+D) 600-800 MG-UNIT TABS Take 1 tablet by mouth 2 (two) times daily.    . calcium carbonate (TUMS EX) 750 MG chewable tablet Chew 1-2 tablets by mouth 3 (three) times daily as needed (for heartburn/indigestion).    . cefadroxil (DURICEF) 500 MG capsule Take 1 capsule (500 mg total) by mouth 2 (two) times daily. Start one hour prior to office procedure on 05-18-17 20 capsule 0  . Cholecalciferol (VITAMIN D-3) 5000 units TABS Take 5,000 Units by mouth daily.    Marland Kitchen HYDROcodone-acetaminophen (NORCO) 5-325 MG tablet Take 1-2 tablets by mouth every 4 (four) hours as needed for moderate pain. 30 tablet 0  . magnesium oxide (MAG-OX) 400 MG tablet Take 400 mg by mouth at bedtime.     . Multiple  Vitamin (MULTIVITAMIN WITH MINERALS) TABS tablet Take 1 tablet by mouth daily. Senior Multivitamin    . ondansetron (ZOFRAN) 8 MG tablet Take 1 tablet (8 mg total) by mouth 2 (two) times daily as needed for refractory nausea / vomiting. Start on day 3 after chemo. 60 tablet 2  . Potassium 99 MG TABS Take 99 mg by mouth 2 (two) times daily.     . prochlorperazine (COMPAZINE) 10 MG tablet Take 1 tablet (10 mg total) by mouth every 6 (six) hours as needed (Nausea or vomiting). 60 tablet 2  . vitamin C (ASCORBIC ACID) 500 MG tablet Take 500 mg by mouth daily.     No current facility-administered medications for this visit.     Review of Systems Review of Systems  Constitutional: Negative.   Respiratory: Negative.   Cardiovascular: Negative.     Blood pressure 130/70, pulse 66, resp. rate 12, height '5\' 2"'  (1.575 m), weight 186 lb  (84.4 kg).  Physical Exam Physical Exam  Constitutional: She is oriented to person, place, and time. She appears well-developed and well-nourished.  Pulmonary/Chest: Right breast exhibits no inverted nipple, no mass, no nipple discharge, no skin change and no tenderness. Left breast exhibits no inverted nipple, no mass, no nipple discharge, no skin change and no tenderness.    Neurological: She is alert and oriented to person, place, and time.  Skin: Skin is warm and dry.    Data Reviewed 16 mm invasive mammary carcinoma the right breast, negative margins. Single sentinel lymph node negative. Histologic grade 3. DCIS present. Closest DCIS margin 1 mm from anterior. Similar for invasive cancer.  ER negative, PR negative, HER-2/neu not overexpressed.  Radiation oncology notes reviewed.  Assessment    Doing well status post first chemotherapy and completion of accelerated partial breast radiation.    Plan    We'll plan for follow-up exam after she completes her adjuvant chemotherapy.     Patient to return in four months.   HPI, Physical Exam, Assessment and Plan have been scribed under the direction and in the presence of Hervey Ard, MD.  Gaspar Cola, CMA  I have completed the exam and reviewed the above documentation for accuracy and completeness.  I agree with the above.  Haematologist has been used and any errors in dictation or transcription are unintentional.  Hervey Ard, M.D., F.A.C.S.   Robert Bellow 06/09/2017, 9:52 PM

## 2017-06-10 NOTE — Progress Notes (Signed)
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  INTERVAL HISTORY: Patient returns to clinic today for further evaluation and to assess her toleration of cycle 1 of 4 of Taxotere and Cytoxan. She tolerated her treatment well without significant side effects.  She currently feels well and is asymptomatic. She has no neurologic complaints. She denies any recent fevers or illnesses. She has a good appetite and denies weight loss. She has no chest pain or shortness of breath. She denies any nausea, vomiting, constipation, or diarrhea. She has no urinary complaints. Patient offers no further specific complaints today.  REVIEW OF SYSTEMS:   Review of Systems  Constitutional: Negative.  Negative for fever, malaise/fatigue and weight loss.  Respiratory: Negative.  Negative for cough and shortness of breath.   Cardiovascular: Negative.  Negative for chest pain and leg swelling.  Gastrointestinal: Negative.  Negative for abdominal pain.  Genitourinary: Negative.   Musculoskeletal: Negative.   Skin: Negative.  Negative for rash.  Neurological: Negative.  Negative for sensory change and weakness.  Psychiatric/Behavioral: The patient is nervous/anxious.     As per HPI. Otherwise, a complete review of systems is negative.  PAST MEDICAL HISTORY: Past Medical History:  Diagnosis Date  . Arthritis   . Breast cancer [facility]) 2007   right lumpectomy  . Breast cancer ([employer]) 03/31/2017   16 mm invasive mammary carcinoma, left upper outer quadrant, T1c, N0, triple negative, histologic grade 3.  . Family history of  adverse reaction to anesthesia    mom n/v  . GERD (gastroesophageal reflux disease)   . Heart murmur   . Lumbar radiculitis   . Lumbar stenosis     PAST SURGICAL HISTORY: Past Surgical History:  Procedure Laterality Date  . ABDOMINAL HYSTERECTOMY     total  . BACK SURGERY     lumbar  . BREAST BIOPSY Bilateral 03/31/2017   bilat u/s core INVASIVE MAMMARY CARCINOMA  . BREAST BIOPSY Right 03/31/2017   DENSE FIBROSIS AND SHEETS OF MACROPHAGES  . BREAST LUMPECTOMY Right 2007  . BREAST LUMPECTOMY Left 04/23/2017  . BREAST LUMPECTOMY Left 04/23/2017   Procedure: BREAST LUMPECTOMY WITH EXCISION OF SENTINEL NODE;  Surgeon: Robert Bellow, MD;  Location: [facility] ORS;  Service: General;  Laterality: Left;  . BREAST SURGERY Right    masectomy  . EYE SURGERY     cataracts bil  . FRACTURE SURGERY Left 2015 or 2016  . TUMOR EXCISION  2001    FAMILY HISTORY: Family History  Problem Relation Age of Onset  . Breast cancer Sister 78  . Lung cancer Mother 26       mets to breast  . Lung cancer Father 64  . Heart attack Brother   . Breast cancer Maternal Aunt        age unknown    ADVANCED DIRECTIVES (Y/N):  N  HEALTH MAINTENANCE: Social History  Substance Use Topics  . Smoking status: Former Smoker    Packs/day: 0.25    Years: 10.00    Types: Cigarettes    Quit date: 14  . Smokeless tobacco: Never Used  . Alcohol use Yes     Comment:  occasionally     Colonoscopy:  PAP:  Bone density:  Lipid panel:  Allergies  Allergen Reactions  . Oxycodone Other (See Comments)    Can't sleep, feels bad when taking.    Current Outpatient Prescriptions  Medication Sig Dispense Refill  . acetaminophen (TYLENOL) 500 MG tablet Take 1,000 mg by mouth every 6 (six) hours as needed (for pain.).    Marland Kitchen ALPRAZolam (XANAX) 0.25 MG tablet Take 1 tablet (0.25 mg total) by mouth as needed for anxiety. 30 tablet 0  . Calcium Carb-Cholecalciferol (CALCIUM 600+D) 600-800 MG-UNIT TABS Take 1  tablet by mouth 2 (two) times daily.    . calcium carbonate (TUMS EX) 750 MG chewable tablet Chew 1-2 tablets by mouth 3 (three) times daily as needed (for heartburn/indigestion).    . cefadroxil (DURICEF) 500 MG capsule Take 1 capsule (500 mg total) by mouth 2 (two) times daily. Start one hour prior to office procedure on 05-18-17 20 capsule 0  . Cholecalciferol (VITAMIN D-3) 5000 units TABS Take 5,000 Units by mouth daily.    Marland Kitchen HYDROcodone-acetaminophen (NORCO) 5-325 MG tablet Take 1-2 tablets by mouth every 4 (four) hours as needed for moderate pain. 30 tablet 0  . magnesium oxide (MAG-OX) 400 MG tablet Take 400 mg by mouth at bedtime.     . Multiple Vitamin (MULTIVITAMIN WITH MINERALS) TABS tablet Take 1 tablet by mouth daily. Senior Multivitamin    . ondansetron (ZOFRAN) 8 MG tablet Take 1 tablet (8 mg total) by mouth 2 (two) times daily as needed for refractory nausea / vomiting. Start on day 3 after chemo. 60 tablet 2  . Potassium 99 MG TABS Take 99 mg by mouth 2 (two) times daily.     . prochlorperazine (COMPAZINE) 10 MG tablet Take 1 tablet (10 mg total) by mouth every 6 (six) hours as needed (Nausea or vomiting). 60 tablet 2  . vitamin C (ASCORBIC ACID) 500 MG tablet Take 500 mg by mouth daily.     No current facility-administered medications for this visit.     OBJECTIVE: Vitals:   06/11/17 1532  BP: (!) 142/84  Pulse: 89  Resp: 20  Temp: 97.4 F (36.3 C)     Body mass index is 34.79 kg/m.    ECOG FS:0 - Asymptomatic  General: Well-developed, well-nourished, no acute distress. Eyes: Pink conjunctiva, anicteric sclera. Breasts: Patient declined breast exam today. Lungs: Clear to auscultation bilaterally. Heart: Regular rate and rhythm. No rubs, murmurs, or gallops. Abdomen: Soft, nontender, nondistended. No organomegaly noted, normoactive bowel sounds. Musculoskeletal: No edema, cyanosis, or clubbing. Neuro: Alert, answering all questions appropriately. Cranial nerves grossly  intact. Skin: No rashes or petechiae noted. Psych: Normal affect.   LAB RESULTS:  Lab Results  Component Value Date   NA 137 06/11/2017   K 4.0 06/11/2017   CL 106 06/11/2017   CO2 23 06/11/2017   GLUCOSE 118 (H) 06/11/2017   BUN 17 06/11/2017   CREATININE 0.78 06/11/2017   CALCIUM 9.0 06/11/2017   PROT 6.8 06/11/2017   ALBUMIN 3.7 06/11/2017   AST 59 (H) 06/11/2017   ALT 63 (H) 06/11/2017   ALKPHOS 71 06/11/2017   BILITOT 0.5 06/11/2017   GFRNONAA >60 06/11/2017   GFRAA >60 06/11/2017    Lab Results  Component Value Date   WBC 3.3 (L) 06/11/2017   NEUTROABS 0.5 (L) 06/11/2017   HGB 11.1 (L) 06/11/2017   HCT 31.7 (L) 06/11/2017   MCV 85.3 06/11/2017   PLT 151 06/11/2017  STUDIES: US Breast Complete Uni Left Inc Axilla  Result Date: 05/29/2017 Please refer to "Notes" to see consult details.   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  PLAN:    1. 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 This is likely a second primary. Plan to give 4 cycles of every 3 weeks. Patient will also require adjuvant XRT at the conclusion of her chemotherapy. An aromatase inhibitor would not be of any benefit given the ER/PR status of her tumor. Patient tolerated cycle 1 of 4 of Taxotere and Cytoxan last week. Return to clinic in 2 weeks for consideration of cycle 2.  2. 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 Oncotype score 17: Originally diagnosed in 2008. Patient completed 5 years of Arimidex in approximately August 2013. Biopsy of the right breast only revealed fat necrosis at the site of her previous MammoSite. 3. Genetic testing: Patient has a sister and mother both with breast cancer. This is also her second  primary breast cancer, therefore will send for BRCA 1 and 2.  Approximately 20 minutes was spent in discussion of which greater than 50% was consultation.  Patient expressed understanding and was in agreement with this plan. She also understands that She can call clinic at any time with any questions, concerns, or complaints.   Cancer Staging Carcinoma of upper-outer quadrant of left breast in female, estrogen receptor negative ([place]) Staging form: Breast, AJCC 8th Edition - Clinical stage from 04/08/2017: Stage IB (cT1c, cN0, cM0, G3, ER: Negative, PR: Negative, HER2: Negative) - Signed by Lloyd Huger, MD on 04/08/2017 - Pathologic stage from 05/11/2017: Stage IB (pT1c, pN0, cM0, G3, ER: Negative, PR: Negative, HER2: Negative) - Signed by Lloyd Huger, MD on 05/11/2017   Lloyd Huger, MD   06/14/2017 8:54 PM

## 2017-06-11 ENCOUNTER — Inpatient Hospital Stay (HOSPITAL_BASED_OUTPATIENT_CLINIC_OR_DEPARTMENT_OTHER): Payer: Medicare PPO | Admitting: Oncology

## 2017-06-11 ENCOUNTER — Inpatient Hospital Stay: Payer: Medicare PPO

## 2017-06-11 VITALS — BP 142/84 | HR 89 | Temp 97.4°F | Resp 20 | Wt 190.2 lb

## 2017-06-11 DIAGNOSIS — Z803 Family history of malignant neoplasm of breast: Secondary | ICD-10-CM | POA: Diagnosis not present

## 2017-06-11 DIAGNOSIS — Z79899 Other long term (current) drug therapy: Secondary | ICD-10-CM | POA: Diagnosis not present

## 2017-06-11 DIAGNOSIS — Z5111 Encounter for antineoplastic chemotherapy: Secondary | ICD-10-CM | POA: Diagnosis not present

## 2017-06-11 DIAGNOSIS — Z171 Estrogen receptor negative status [ER-]: Principal | ICD-10-CM

## 2017-06-11 DIAGNOSIS — C50412 Malignant neoplasm of upper-outer quadrant of left female breast: Secondary | ICD-10-CM

## 2017-06-11 DIAGNOSIS — F419 Anxiety disorder, unspecified: Secondary | ICD-10-CM | POA: Diagnosis not present

## 2017-06-11 DIAGNOSIS — Z801 Family history of malignant neoplasm of trachea, bronchus and lung: Secondary | ICD-10-CM | POA: Diagnosis not present

## 2017-06-11 DIAGNOSIS — Z87891 Personal history of nicotine dependence: Secondary | ICD-10-CM | POA: Diagnosis not present

## 2017-06-11 DIAGNOSIS — M48061 Spinal stenosis, lumbar region without neurogenic claudication: Secondary | ICD-10-CM

## 2017-06-11 DIAGNOSIS — K219 Gastro-esophageal reflux disease without esophagitis: Secondary | ICD-10-CM

## 2017-06-11 LAB — COMPREHENSIVE METABOLIC PANEL
ALK PHOS: 71 U/L (ref 38–126)
ALT: 63 U/L — AB (ref 14–54)
ANION GAP: 8 (ref 5–15)
AST: 59 U/L — ABNORMAL HIGH (ref 15–41)
Albumin: 3.7 g/dL (ref 3.5–5.0)
BILIRUBIN TOTAL: 0.5 mg/dL (ref 0.3–1.2)
BUN: 17 mg/dL (ref 6–20)
CALCIUM: 9 mg/dL (ref 8.9–10.3)
CO2: 23 mmol/L (ref 22–32)
Chloride: 106 mmol/L (ref 101–111)
Creatinine, Ser: 0.78 mg/dL (ref 0.44–1.00)
Glucose, Bld: 118 mg/dL — ABNORMAL HIGH (ref 65–99)
Potassium: 4 mmol/L (ref 3.5–5.1)
SODIUM: 137 mmol/L (ref 135–145)
TOTAL PROTEIN: 6.8 g/dL (ref 6.5–8.1)

## 2017-06-11 LAB — CBC WITH DIFFERENTIAL/PLATELET
Basophils Absolute: 0.1 10*3/uL (ref 0–0.1)
Basophils Relative: 2 %
EOS ABS: 0.1 10*3/uL (ref 0–0.7)
Eosinophils Relative: 2 %
HEMATOCRIT: 31.7 % — AB (ref 35.0–47.0)
HEMOGLOBIN: 11.1 g/dL — AB (ref 12.0–16.0)
LYMPHS ABS: 2.7 10*3/uL (ref 1.0–3.6)
LYMPHS PCT: 82 %
MCH: 29.7 pg (ref 26.0–34.0)
MCHC: 34.9 g/dL (ref 32.0–36.0)
MCV: 85.3 fL (ref 80.0–100.0)
MONOS PCT: 2 %
Monocytes Absolute: 0.1 10*3/uL — ABNORMAL LOW (ref 0.2–0.9)
NEUTROS PCT: 14 %
Neutro Abs: 0.5 10*3/uL — ABNORMAL LOW (ref 1.4–6.5)
Platelets: 151 10*3/uL (ref 150–440)
RBC: 3.72 MIL/uL — AB (ref 3.80–5.20)
RDW: 12.9 % (ref 11.5–14.5)
WBC: 3.3 10*3/uL — ABNORMAL LOW (ref 3.6–11.0)

## 2017-06-11 NOTE — Progress Notes (Signed)
Patient denies any concerns today.  

## 2017-06-23 NOTE — Progress Notes (Signed)
[place]  Telephone:(336) [phone] Fax:(336) [phone]  ID: Elissa Lovett OB: June 12, 1934  MR#: [id number]  KDT#:[id number]  Patient Care Team: Lynnell Jude, MD as PCP - General (Family Medicine) Clemmie Krill Lynnell Jude, MD as Referring Physician (Family Medicine) Bary Castilla Forest Gleason, MD (General Surgery)  CHIEF COMPLAINT: Pathologic stage IB triple negative invasive carcinoma of the upper outer quadrant of the left breast.  INTERVAL HISTORY: Patient returns to clinic today for further evaluation and consideration of cycle 2 of 4 of Taxotere and Cytoxan. She tolerated her treatment well without significant side effects.  She has intermittent anxiety and insomnia, but otherwise feels well and is asymptomatic. She has no neurologic complaints. She denies any recent fevers or illnesses. She has a good appetite and denies weight loss. She has no chest pain or shortness of breath. She denies any nausea, vomiting, constipation, or diarrhea. She has no urinary complaints. Patient offers no further specific complaints today.  REVIEW OF SYSTEMS:   Review of Systems  Constitutional: Negative.  Negative for fever, malaise/fatigue and weight loss.  Respiratory: Negative.  Negative for cough and shortness of breath.   Cardiovascular: Negative.  Negative for chest pain and leg swelling.  Gastrointestinal: Negative.  Negative for abdominal pain.  Genitourinary: Negative.   Musculoskeletal: Negative.   Skin: Negative.  Negative for rash.  Neurological: Negative.  Negative for sensory change and weakness.  Psychiatric/Behavioral: The patient is nervous/anxious and has insomnia.     As per HPI. Otherwise, a complete review of systems is negative.  PAST MEDICAL HISTORY: Past Medical History:  Diagnosis Date  . Arthritis   . Breast cancer [facility]) 2007   right lumpectomy  . Breast cancer ([place]) 03/31/2017   16 mm invasive mammary carcinoma, left upper outer quadrant, T1c, N0, triple  negative, histologic grade 3.  . Family history of adverse reaction to anesthesia    mom n/v  . GERD (gastroesophageal reflux disease)   . Heart murmur   . Lumbar radiculitis   . Lumbar stenosis     PAST SURGICAL HISTORY: Past Surgical History:  Procedure Laterality Date  . ABDOMINAL HYSTERECTOMY     total  . BACK SURGERY     lumbar  . BREAST BIOPSY Bilateral 03/31/2017   bilat u/s core INVASIVE MAMMARY CARCINOMA  . BREAST BIOPSY Right 03/31/2017   DENSE FIBROSIS AND SHEETS OF MACROPHAGES  . BREAST LUMPECTOMY Right 2007  . BREAST LUMPECTOMY Left 04/23/2017  . BREAST LUMPECTOMY Left 04/23/2017   Procedure: BREAST LUMPECTOMY WITH EXCISION OF SENTINEL NODE;  Surgeon: Robert Bellow, MD;  Location: [facility] ORS;  Service: General;  Laterality: Left;  . BREAST SURGERY Right    masectomy  . EYE SURGERY     cataracts bil  . FRACTURE SURGERY Left 2015 or 2016  . TUMOR EXCISION  2001    FAMILY HISTORY: Family History  Problem Relation Age of Onset  . Breast cancer Sister 28  . Lung cancer Mother 23       mets to breast  . Lung cancer Father 30  . Heart attack Brother   . Breast cancer Maternal Aunt        age unknown    ADVANCED DIRECTIVES (Y/N):  N  HEALTH MAINTENANCE: Social History  Substance Use Topics  . Smoking status: Former Smoker    Packs/day: 0.25    Years: 10.00    Types: Cigarettes    Quit date: 54  . Smokeless tobacco: Never Used  . Alcohol use  Yes     Comment: occasionally     Colonoscopy:  PAP:  Bone density:  Lipid panel:  Allergies  Allergen Reactions  . Oxycodone Other (See Comments)    Can't sleep, feels bad when taking.    Current Outpatient Prescriptions  Medication Sig Dispense Refill  . acetaminophen (TYLENOL) 500 MG tablet Take 1,000 mg by mouth every 6 (six) hours as needed (for pain.).    Marland Kitchen ALPRAZolam (XANAX) 0.25 MG tablet Take 1 tablet (0.25 mg total) by mouth as needed for anxiety. 30 tablet 0  . Calcium  Carb-Cholecalciferol (CALCIUM 600+D) 600-800 MG-UNIT TABS Take 1 tablet by mouth 2 (two) times daily.    . calcium carbonate (TUMS EX) 750 MG chewable tablet Chew 1-2 tablets by mouth 3 (three) times daily as needed (for heartburn/indigestion).    . cefadroxil (DURICEF) 500 MG capsule Take 1 capsule (500 mg total) by mouth 2 (two) times daily. Start one hour prior to office procedure on 05-18-17 20 capsule 0  . Cholecalciferol (VITAMIN D-3) 5000 units TABS Take 5,000 Units by mouth daily.    Marland Kitchen HYDROcodone-acetaminophen (NORCO) 5-325 MG tablet Take 1-2 tablets by mouth every 4 (four) hours as needed for moderate pain. 30 tablet 0  . magnesium oxide (MAG-OX) 400 MG tablet Take 400 mg by mouth at bedtime.     . Multiple Vitamin (MULTIVITAMIN WITH MINERALS) TABS tablet Take 1 tablet by mouth daily. Senior Multivitamin    . ondansetron (ZOFRAN) 8 MG tablet Take 1 tablet (8 mg total) by mouth 2 (two) times daily as needed for refractory nausea / vomiting. Start on day 3 after chemo. 60 tablet 2  . Potassium 99 MG TABS Take 99 mg by mouth 2 (two) times daily.     . prochlorperazine (COMPAZINE) 10 MG tablet Take 1 tablet (10 mg total) by mouth every 6 (six) hours as needed (Nausea or vomiting). 60 tablet 2  . vitamin C (ASCORBIC ACID) 500 MG tablet Take 500 mg by mouth daily.     No current facility-administered medications for this visit.     OBJECTIVE: Vitals:   06/25/17 0902  BP: 118/80  Pulse: (!) 58  Resp: 18  Temp: 97.9 F (36.6 C)     Body mass index is 34.62 kg/m.    ECOG FS:0 - Asymptomatic  General: Well-developed, well-nourished, no acute distress. Eyes: Pink conjunctiva, anicteric sclera. Breasts: Patient declined breast exam today. Lungs: Clear to auscultation bilaterally. Heart: Regular rate and rhythm. No rubs, murmurs, or gallops. Abdomen: Soft, nontender, nondistended. No organomegaly noted, normoactive bowel sounds. Musculoskeletal: No edema, cyanosis, or clubbing. Neuro:  Alert, answering all questions appropriately. Cranial nerves grossly intact. Skin: No rashes or petechiae noted. Psych: Normal affect.   LAB RESULTS:  Lab Results  Component Value Date   NA 137 06/11/2017   K 4.0 06/11/2017   CL 106 06/11/2017   CO2 23 06/11/2017   GLUCOSE 118 (H) 06/11/2017   BUN 17 06/11/2017   CREATININE 0.78 06/11/2017   CALCIUM 9.0 06/11/2017   PROT 6.8 06/11/2017   ALBUMIN 3.7 06/11/2017   AST 59 (H) 06/11/2017   ALT 63 (H) 06/11/2017   ALKPHOS 71 06/11/2017   BILITOT 0.5 06/11/2017   GFRNONAA >60 06/11/2017   GFRAA >60 06/11/2017    Lab Results  Component Value Date   WBC 11.5 (H) 06/25/2017   NEUTROABS 5.9 06/25/2017   HGB 12.4 06/25/2017   HCT 35.4 06/25/2017   MCV 85.7 06/25/2017   PLT 210  06/25/2017     STUDIES: No results found.  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  PLAN:    1. 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 This is likely a second primary. Plan to give 4 cycles of every 3 weeks. Patient will also require adjuvant XRT at the conclusion of her chemotherapy. An aromatase inhibitor would not be of any benefit given the ER/PR status of her tumor. Proceed with cycle 2 of 4 of Taxotere and Cytoxan last week. Return to clinic in 3 weeks for consideration of cycle 3.  2. 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 Oncotype score 17: Originally diagnosed in 2008. Patient completed 5 years of Arimidex in approximately August 2013. Biopsy of the right breast only revealed fat necrosis at the site of her previous MammoSite. 3. Genetic testing: Patient has a sister and mother both with breast cancer. 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 4. Anxiety/insomnia: Patient was previously given a prescription for Xanax which she has agreed to get filled at this time.  Approximately 30 minutes was spent in discussion of which greater than 50% was consultation.  Patient expressed understanding and was in agreement with this plan. She also understands that She can call clinic at any time with any questions, concerns, or complaints.   Cancer Staging Carcinoma of upper-outer quadrant of left breast in female, estrogen receptor negative ([place]) Staging form: Breast, AJCC 8th Edition - Clinical stage from 04/08/2017: Stage IB (cT1c, cN0, cM0, G3, ER: Negative, PR: Negative, HER2: Negative) - Signed by Lloyd Huger, MD on 04/08/2017 - Pathologic stage from 05/11/2017: Stage IB (pT1c, pN0, cM0, G3, ER: Negative, PR: Negative, HER2: Negative) - Signed by Lloyd Huger, MD on 05/11/2017   Lloyd Huger, MD   06/25/2017 9:12 AM

## 2017-06-25 ENCOUNTER — Inpatient Hospital Stay: Payer: Medicare PPO | Attending: Oncology

## 2017-06-25 ENCOUNTER — Inpatient Hospital Stay (HOSPITAL_BASED_OUTPATIENT_CLINIC_OR_DEPARTMENT_OTHER): Payer: Medicare PPO | Admitting: Oncology

## 2017-06-25 ENCOUNTER — Inpatient Hospital Stay: Payer: Medicare PPO

## 2017-06-25 VITALS — BP 118/80 | HR 58 | Temp 97.9°F | Resp 18 | Wt 189.3 lb

## 2017-06-25 DIAGNOSIS — Z803 Family history of malignant neoplasm of breast: Secondary | ICD-10-CM | POA: Diagnosis not present

## 2017-06-25 DIAGNOSIS — Z171 Estrogen receptor negative status [ER-]: Principal | ICD-10-CM

## 2017-06-25 DIAGNOSIS — C50412 Malignant neoplasm of upper-outer quadrant of left female breast: Secondary | ICD-10-CM | POA: Insufficient documentation

## 2017-06-25 DIAGNOSIS — Z801 Family history of malignant neoplasm of trachea, bronchus and lung: Secondary | ICD-10-CM | POA: Insufficient documentation

## 2017-06-25 DIAGNOSIS — F419 Anxiety disorder, unspecified: Secondary | ICD-10-CM | POA: Insufficient documentation

## 2017-06-25 DIAGNOSIS — M48061 Spinal stenosis, lumbar region without neurogenic claudication: Secondary | ICD-10-CM | POA: Insufficient documentation

## 2017-06-25 DIAGNOSIS — K219 Gastro-esophageal reflux disease without esophagitis: Secondary | ICD-10-CM | POA: Diagnosis not present

## 2017-06-25 DIAGNOSIS — Z1501 Genetic susceptibility to malignant neoplasm of breast: Secondary | ICD-10-CM

## 2017-06-25 DIAGNOSIS — Z79899 Other long term (current) drug therapy: Secondary | ICD-10-CM | POA: Diagnosis not present

## 2017-06-25 DIAGNOSIS — M129 Arthropathy, unspecified: Secondary | ICD-10-CM | POA: Insufficient documentation

## 2017-06-25 DIAGNOSIS — G4700 Insomnia, unspecified: Secondary | ICD-10-CM | POA: Diagnosis not present

## 2017-06-25 DIAGNOSIS — Z5111 Encounter for antineoplastic chemotherapy: Secondary | ICD-10-CM | POA: Diagnosis present

## 2017-06-25 DIAGNOSIS — Z853 Personal history of malignant neoplasm of breast: Secondary | ICD-10-CM

## 2017-06-25 DIAGNOSIS — R011 Cardiac murmur, unspecified: Secondary | ICD-10-CM

## 2017-06-25 DIAGNOSIS — Z87891 Personal history of nicotine dependence: Secondary | ICD-10-CM | POA: Insufficient documentation

## 2017-06-25 LAB — CBC WITH DIFFERENTIAL/PLATELET
BASOS ABS: 0.1 10*3/uL (ref 0–0.1)
Basophils Relative: 1 %
EOS PCT: 0 %
Eosinophils Absolute: 0 10*3/uL (ref 0–0.7)
HEMATOCRIT: 35.4 % (ref 35.0–47.0)
Hemoglobin: 12.4 g/dL (ref 12.0–16.0)
LYMPHS PCT: 39 %
Lymphs Abs: 4.5 10*3/uL — ABNORMAL HIGH (ref 1.0–3.6)
MCH: 29.9 pg (ref 26.0–34.0)
MCHC: 34.9 g/dL (ref 32.0–36.0)
MCV: 85.7 fL (ref 80.0–100.0)
MONO ABS: 1.1 10*3/uL — AB (ref 0.2–0.9)
MONOS PCT: 9 %
NEUTROS ABS: 5.9 10*3/uL (ref 1.4–6.5)
Neutrophils Relative %: 51 %
Platelets: 210 10*3/uL (ref 150–440)
RBC: 4.14 MIL/uL (ref 3.80–5.20)
RDW: 13.4 % (ref 11.5–14.5)
WBC: 11.5 10*3/uL — ABNORMAL HIGH (ref 3.6–11.0)

## 2017-06-25 LAB — COMPREHENSIVE METABOLIC PANEL
ALT: 22 U/L (ref 14–54)
AST: 27 U/L (ref 15–41)
Albumin: 3.8 g/dL (ref 3.5–5.0)
Alkaline Phosphatase: 59 U/L (ref 38–126)
Anion gap: 6 (ref 5–15)
BILIRUBIN TOTAL: 0.5 mg/dL (ref 0.3–1.2)
BUN: 17 mg/dL (ref 6–20)
CO2: 23 mmol/L (ref 22–32)
Calcium: 9.5 mg/dL (ref 8.9–10.3)
Chloride: 110 mmol/L (ref 101–111)
Creatinine, Ser: 0.79 mg/dL (ref 0.44–1.00)
Glucose, Bld: 93 mg/dL (ref 65–99)
POTASSIUM: 4.5 mmol/L (ref 3.5–5.1)
Sodium: 139 mmol/L (ref 135–145)
Total Protein: 6.9 g/dL (ref 6.5–8.1)

## 2017-06-25 MED ORDER — SODIUM CHLORIDE 0.9 % IV SOLN
75.0000 mg/m2 | Freq: Once | INTRAVENOUS | Status: AC
  Administered 2017-06-25: 150 mg via INTRAVENOUS
  Filled 2017-06-25: qty 15

## 2017-06-25 MED ORDER — SODIUM CHLORIDE 0.9 % IV SOLN
600.0000 mg/m2 | Freq: Once | INTRAVENOUS | Status: AC
  Administered 2017-06-25: 1160 mg via INTRAVENOUS
  Filled 2017-06-25: qty 50

## 2017-06-25 MED ORDER — DEXAMETHASONE SODIUM PHOSPHATE 10 MG/ML IJ SOLN
10.0000 mg | Freq: Once | INTRAMUSCULAR | Status: AC
  Administered 2017-06-25: 10 mg via INTRAVENOUS

## 2017-06-25 MED ORDER — SODIUM CHLORIDE 0.9 % IV SOLN: 10.0000 mg | Freq: Once | INTRAVENOUS | Status: DC

## 2017-06-25 MED ORDER — SODIUM CHLORIDE 0.9 % IV SOLN
Freq: Once | INTRAVENOUS | Status: AC
  Administered 2017-06-25: 10:00:00 via INTRAVENOUS
  Filled 2017-06-25: qty 1000

## 2017-06-25 MED ORDER — PALONOSETRON HCL INJECTION 0.25 MG/5ML
0.2500 mg | Freq: Once | INTRAVENOUS | Status: AC
  Administered 2017-06-25: 0.25 mg via INTRAVENOUS

## 2017-06-25 NOTE — Progress Notes (Signed)
Patient is here for follow up  

## 2017-07-06 ENCOUNTER — Encounter: Payer: Self-pay | Admitting: Radiation Oncology

## 2017-07-06 ENCOUNTER — Ambulatory Visit
Admission: RE | Admit: 2017-07-06 | Discharge: 2017-07-06 | Disposition: A | Discharge status: Home / Self Care | Payer: Medicare PPO | Source: Ambulatory Visit | Attending: Radiation Oncology | Admitting: Radiation Oncology

## 2017-07-06 VITALS — BP 132/75 | HR 84 | Temp 98.6°F | Wt 187.5 lb

## 2017-07-06 DIAGNOSIS — R21 Rash and other nonspecific skin eruption: Secondary | ICD-10-CM | POA: Diagnosis not present

## 2017-07-06 DIAGNOSIS — Z923 Personal history of irradiation: Secondary | ICD-10-CM | POA: Diagnosis not present

## 2017-07-06 DIAGNOSIS — C50412 Malignant neoplasm of upper-outer quadrant of left female breast: Secondary | ICD-10-CM | POA: Diagnosis not present

## 2017-07-06 DIAGNOSIS — Z171 Estrogen receptor negative status [ER-]: Secondary | ICD-10-CM | POA: Insufficient documentation

## 2017-07-06 DIAGNOSIS — Z853 Personal history of malignant neoplasm of breast: Secondary | ICD-10-CM | POA: Diagnosis not present

## 2017-07-06 DIAGNOSIS — Z9221 Personal history of antineoplastic chemotherapy: Secondary | ICD-10-CM | POA: Diagnosis not present

## 2017-07-06 IMAGING — MG MM BREAST SURGICAL SPECIMEN
1 series · 1 of 1 positions shown · non-contrast
Comparison: Previous exam(s).

CLINICAL DATA: Specimen radiograph evaluation.

EXAM:
SPECIMEN RADIOGRAPH OF THE LEFT BREAST

[L SPECIMEN]
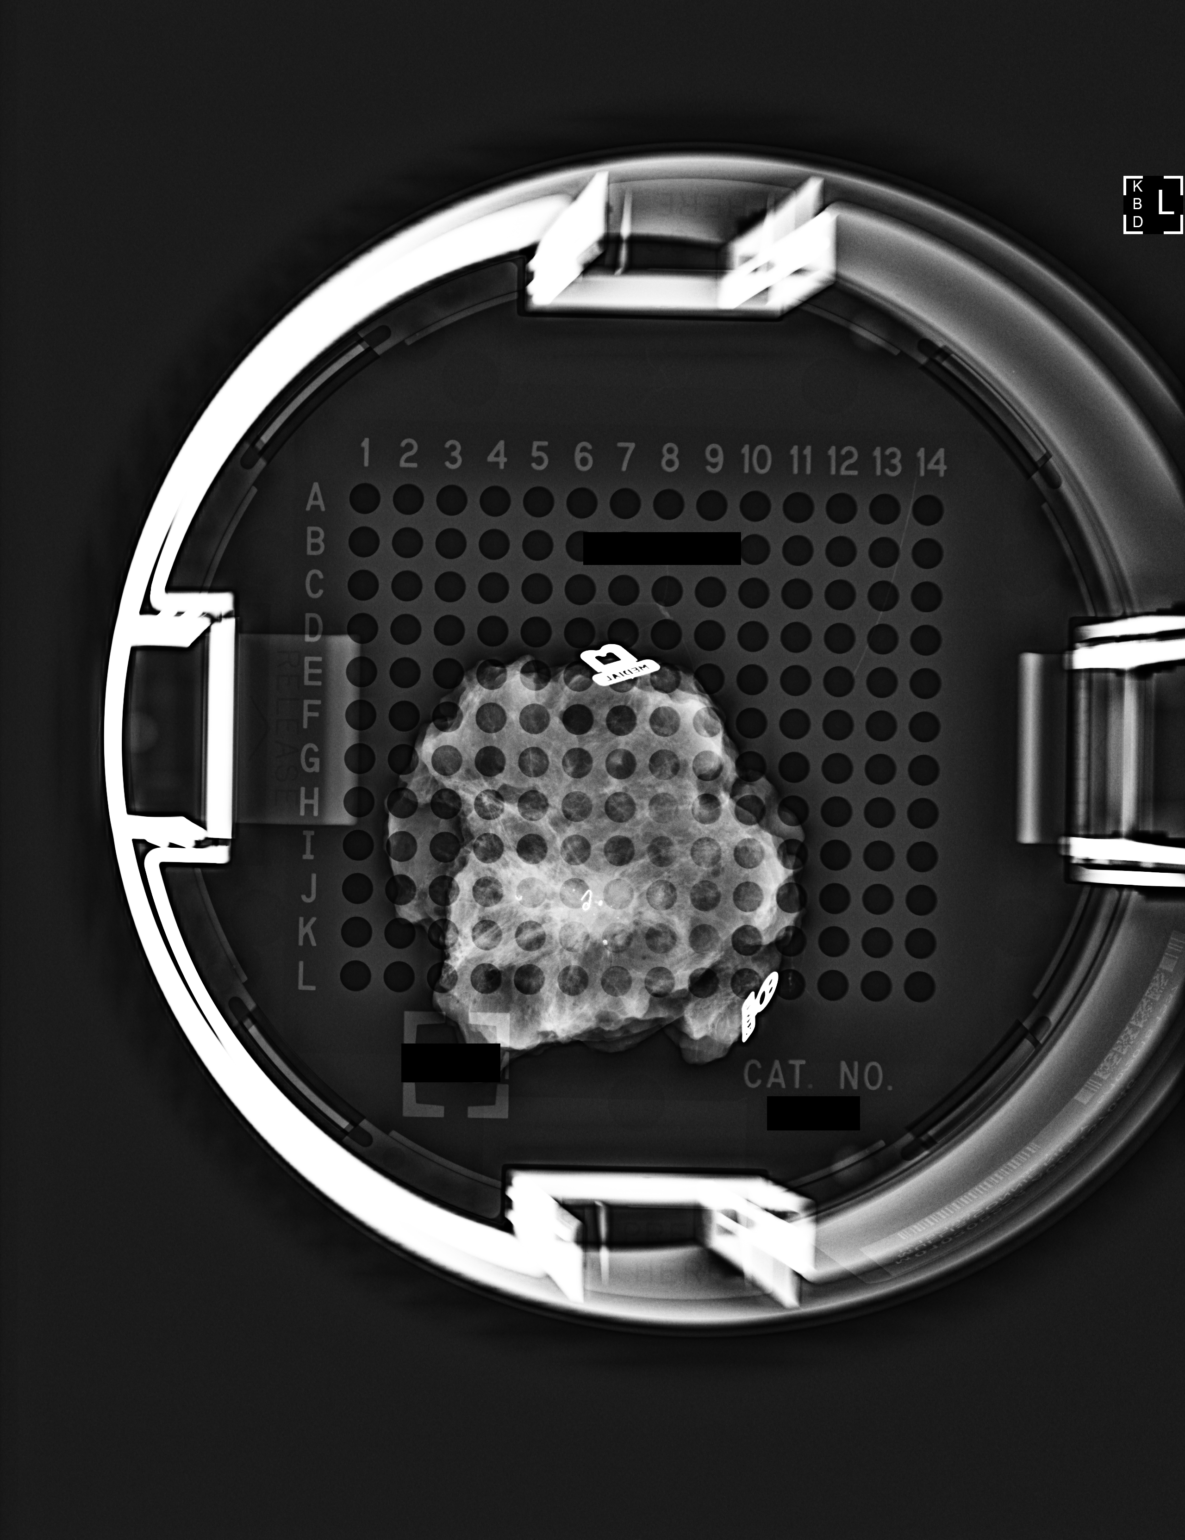

[1 of 1 positions shown; findings below may reference images not displayed]

FINDINGS: Status post excision of the mass containing the heart shaped clip.
The heart shaped clip and mass are present within the specimen.
IMPRESSION: Specimen radiograph of the left breast specimen.

## 2017-07-06 NOTE — Progress Notes (Signed)
Radiation Oncology Follow up Note  Name: Megan Santana   Date:   07/06/2017 MRN:  [id number] DOB: 12/29/1933    This 83 y.o. female presents to the clinic today for one-month follow-up status post accelerated partial breast irradiation to her left breast for stage I ER/PR negative invasive mammary carcinoma.  REFERRING PROVIDER: Lynnell Jude, MD  HPI: patient is an 83 year old female treated back 11 years prior to her right breast using accelerated partial breast radiation for stage I invasive mammary carcinoma she is now 1 month out same type of procedure accelerated partial breast radiation to her left breast for a ER/PR negative HER-2/neu equivocal invasive mammary carcinoma 1.6 cm..she is currently undergoing cycle 2 of4 of Taxotere and Cytoxan which is tolerating well except for a rash that's broken out mostly on her trunk. Her Oncotype DX score was 17.It is itching I have suggested some Benadryl gel for that.  COMPLICATIONS OF TREATMENT: none  FOLLOW UP COMPLIANCE: keeps appointments   PHYSICAL EXAM:  There were no vitals taken for this visit. Lungs are clear to A&P cardiac examination essentially unremarkable with regular rate and rhythm. No dominant mass or nodularity is noted in either breast in 2 positions examined. Incision is well-healed. No axillary or supraclavicular adenopathy is appreciated. Cosmetic result is excellent.she does have inversion of both nipples which is been present forever. Does have some thickening lumpectomy site which we would expect from accelerated partial breast radiation. She does have a maculopapular rash on her trunk and upper extremities.Well-developed well-nourished patient in NAD. HEENT reveals PERLA, EOMI, discs not visualized.  Oral cavity is clear. No oral mucosal lesions are identified. Neck is clear without evidence of cervical or supraclavicular adenopathy. Lungs are clear to A&P. Cardiac examination is essentially unremarkable with regular rate  and rhythm without murmur rub or thrill. Abdomen is benign with no organomegaly or masses noted. Motor sensory and DTR levels are equal and symmetric in the upper and lower extremities. Cranial nerves II through XII are grossly intact. Proprioception is intact. No peripheral adenopathy or edema is identified. No motor or sensory levels are noted. Crude visual fields are within normal range.  RADIOLOGY RESULTS: no current films for review  PLAN: present time she is doing well she's currently undergoing chemotherapy. She has had very little side effects or complaints from her accelerated partial breast radiation. I have suggested Benadryl gel for some of the itching of her skin. I've asked to see her back in 4-5 months for follow-up. She continues chemotherapy under medical oncology's direction. Patient knows to call with any concerns.  I would like to take this opportunity to thank you for allowing me to participate in the care of your patient.Armstead Peaks., MD

## 2017-07-10 ENCOUNTER — Other Ambulatory Visit: Payer: Self-pay | Admitting: Oncology

## 2017-07-16 ENCOUNTER — Inpatient Hospital Stay: Payer: Medicare PPO

## 2017-07-16 ENCOUNTER — Inpatient Hospital Stay (HOSPITAL_BASED_OUTPATIENT_CLINIC_OR_DEPARTMENT_OTHER): Payer: Medicare PPO | Admitting: Oncology

## 2017-07-16 ENCOUNTER — Other Ambulatory Visit: Payer: Self-pay

## 2017-07-16 ENCOUNTER — Inpatient Hospital Stay: Payer: Medicare PPO | Attending: Oncology

## 2017-07-16 VITALS — BP 138/79 | HR 88 | Temp 97.8°F | Resp 20 | Wt 188.6 lb

## 2017-07-16 DIAGNOSIS — C50412 Malignant neoplasm of upper-outer quadrant of left female breast: Secondary | ICD-10-CM | POA: Diagnosis not present

## 2017-07-16 DIAGNOSIS — G4700 Insomnia, unspecified: Secondary | ICD-10-CM | POA: Insufficient documentation

## 2017-07-16 DIAGNOSIS — M48061 Spinal stenosis, lumbar region without neurogenic claudication: Secondary | ICD-10-CM | POA: Insufficient documentation

## 2017-07-16 DIAGNOSIS — Z5111 Encounter for antineoplastic chemotherapy: Secondary | ICD-10-CM | POA: Diagnosis present

## 2017-07-16 DIAGNOSIS — R4182 Altered mental status, unspecified: Secondary | ICD-10-CM | POA: Diagnosis not present

## 2017-07-16 DIAGNOSIS — K219 Gastro-esophageal reflux disease without esophagitis: Secondary | ICD-10-CM | POA: Diagnosis not present

## 2017-07-16 DIAGNOSIS — G4701 Insomnia due to medical condition: Secondary | ICD-10-CM

## 2017-07-16 DIAGNOSIS — Z171 Estrogen receptor negative status [ER-]: Secondary | ICD-10-CM | POA: Insufficient documentation

## 2017-07-16 DIAGNOSIS — F419 Anxiety disorder, unspecified: Secondary | ICD-10-CM | POA: Insufficient documentation

## 2017-07-16 DIAGNOSIS — R011 Cardiac murmur, unspecified: Secondary | ICD-10-CM | POA: Insufficient documentation

## 2017-07-16 DIAGNOSIS — Z803 Family history of malignant neoplasm of breast: Secondary | ICD-10-CM | POA: Diagnosis not present

## 2017-07-16 DIAGNOSIS — Z79899 Other long term (current) drug therapy: Secondary | ICD-10-CM

## 2017-07-16 DIAGNOSIS — Z87891 Personal history of nicotine dependence: Secondary | ICD-10-CM | POA: Diagnosis not present

## 2017-07-16 DIAGNOSIS — Z853 Personal history of malignant neoplasm of breast: Secondary | ICD-10-CM | POA: Insufficient documentation

## 2017-07-16 LAB — CBC WITH DIFFERENTIAL/PLATELET
BASOS PCT: 1 %
Basophils Absolute: 0.1 10*3/uL (ref 0–0.1)
EOS PCT: 0 %
Eosinophils Absolute: 0 10*3/uL (ref 0–0.7)
HEMATOCRIT: 35.6 % (ref 35.0–47.0)
Hemoglobin: 12.5 g/dL (ref 12.0–16.0)
Lymphocytes Relative: 34 %
Lymphs Abs: 3.4 10*3/uL (ref 1.0–3.6)
MCH: 29.9 pg (ref 26.0–34.0)
MCHC: 35 g/dL (ref 32.0–36.0)
MCV: 85.4 fL (ref 80.0–100.0)
MONO ABS: 1.1 10*3/uL — AB (ref 0.2–0.9)
MONOS PCT: 11 %
NEUTROS ABS: 5.4 10*3/uL (ref 1.4–6.5)
Neutrophils Relative %: 54 %
Platelets: 201 10*3/uL (ref 150–440)
RBC: 4.17 MIL/uL (ref 3.80–5.20)
RDW: 14.4 % (ref 11.5–14.5)
WBC: 10.1 10*3/uL (ref 3.6–11.0)

## 2017-07-16 LAB — COMPREHENSIVE METABOLIC PANEL
ALBUMIN: 4 g/dL (ref 3.5–5.0)
ALT: 26 U/L (ref 14–54)
ANION GAP: 8 (ref 5–15)
AST: 29 U/L (ref 15–41)
Alkaline Phosphatase: 57 U/L (ref 38–126)
BILIRUBIN TOTAL: 0.6 mg/dL (ref 0.3–1.2)
BUN: 13 mg/dL (ref 6–20)
CHLORIDE: 107 mmol/L (ref 101–111)
CO2: 24 mmol/L (ref 22–32)
Calcium: 9.5 mg/dL (ref 8.9–10.3)
Creatinine, Ser: 0.9 mg/dL (ref 0.44–1.00)
GFR calc Af Amer: >60 mL/min (ref >60)
GFR, EST NON AFRICAN AMERICAN: 59 mL/min — AB (ref >60)
Glucose, Bld: 100 mg/dL — ABNORMAL HIGH (ref 65–99)
POTASSIUM: 4.1 mmol/L (ref 3.5–5.1)
Sodium: 139 mmol/L (ref 135–145)
TOTAL PROTEIN: 6.7 g/dL (ref 6.5–8.1)

## 2017-07-16 MED ORDER — PALONOSETRON HCL INJECTION 0.25 MG/5ML
0.2500 mg | Freq: Once | INTRAVENOUS | Status: AC
  Administered 2017-07-16: 0.25 mg via INTRAVENOUS
  Filled 2017-07-16: qty 5

## 2017-07-16 MED ORDER — SODIUM CHLORIDE 0.9 % IV SOLN
75.0000 mg/m2 | Freq: Once | INTRAVENOUS | Status: AC
  Administered 2017-07-16: 150 mg via INTRAVENOUS
  Filled 2017-07-16: qty 15

## 2017-07-16 MED ORDER — SODIUM CHLORIDE 0.9 % IV SOLN
Freq: Once | INTRAVENOUS | Status: AC
Start: 2017-07-16 — End: 2017-07-16
  Administered 2017-07-16: 10:00:00 via INTRAVENOUS
  Filled 2017-07-16: qty 1000

## 2017-07-16 MED ORDER — ALPRAZOLAM 0.25 MG PO TABS: 0.2500 mg | ORAL_TABLET | Freq: Two times a day (BID) | ORAL | 0 refills | Status: DC | PRN

## 2017-07-16 MED ORDER — SODIUM CHLORIDE 0.9 % IV SOLN
600.0000 mg/m2 | Freq: Once | INTRAVENOUS | Status: AC
  Administered 2017-07-16: 1160 mg via INTRAVENOUS
  Filled 2017-07-16: qty 50

## 2017-07-16 MED ORDER — ALPRAZOLAM 0.25 MG PO TABS: 0.2500 mg | ORAL_TABLET | ORAL | 0 refills | Status: DC | PRN

## 2017-07-16 MED ORDER — DEXAMETHASONE SODIUM PHOSPHATE 10 MG/ML IJ SOLN
10.0000 mg | Freq: Once | INTRAMUSCULAR | Status: AC
  Administered 2017-07-16: 10 mg via INTRAVENOUS
  Filled 2017-07-16: qty 1

## 2017-07-16 NOTE — Progress Notes (Signed)
[place]  [phone] [phone]) [phone]  ID: Elissa Lovett OB: 04-Jul-1934  MR#: [id number]  TRV#:[id number]  Patient Care Team: Lynnell Jude, MD as PCP - General (Family Medicine) Clemmie Krill Lynnell Jude, MD as Referring Physician (Family Medicine) Bary Castilla Forest Gleason, MD (General Surgery)  CHIEF COMPLAINT: Pathologic stage IB triple negative invasive carcinoma of the upper outer quadrant of the left breast.  INTERVAL HISTORY: Patient returns to clinic today for further evaluation and consideration of cycle 3 of 4 of Taxotere and Cytoxan. She tolerated her treatment well without significant side effects.  She has intermittent anxiety and insomnia, but otherwise feels well and is asymptomatic. Daughter is very worried about her change in mental status, being very irritable. Patient does not want to take Xanax because she is worried that she will become addicted and she can always find a reason for her anxiety. She has no neurologic complaints. She denies any recent fevers or illnesses. She has a good appetite and denies weight loss. She has no chest pain or shortness of breath. She denies any nausea, vomiting, constipation, or diarrhea. She has no urinary complaints. Patient offers no further specific complaints today.  REVIEW OF SYSTEMS:   Review of Systems  Constitutional: Negative.  Negative for fever, malaise/fatigue and weight loss.  Respiratory: Negative.  Negative for cough and shortness of breath.   Cardiovascular: Negative.  Negative for chest pain and leg swelling.  Gastrointestinal: Negative.  Negative for abdominal pain.  Genitourinary: Negative.   Musculoskeletal: Negative.   Skin: Negative.  Negative for rash.  Neurological: Negative.  Negative for sensory change and weakness.  Psychiatric/Behavioral: The patient is nervous/anxious and has insomnia.     As per HPI. Otherwise, a complete review of systems is negative.  PAST MEDICAL HISTORY: Past  Medical History:  Diagnosis Date  . Arthritis   . Breast cancer [facility]) 2007   right lumpectomy  . Breast cancer ([place]) 03/31/2017   16 mm invasive mammary carcinoma, left upper outer quadrant, T1c, N0, triple negative, histologic grade 3.  . Family history of adverse reaction to anesthesia    mom n/v  . GERD (gastroesophageal reflux disease)   . Heart murmur   . Lumbar radiculitis   . Lumbar stenosis     PAST SURGICAL HISTORY: Past Surgical History:  Procedure Laterality Date  . ABDOMINAL HYSTERECTOMY     total  . BACK SURGERY     lumbar  . BREAST BIOPSY Bilateral 03/31/2017   bilat u/s core INVASIVE MAMMARY CARCINOMA  . BREAST BIOPSY Right 03/31/2017   DENSE FIBROSIS AND SHEETS OF MACROPHAGES  . BREAST LUMPECTOMY Right 2007  . BREAST LUMPECTOMY Left 04/23/2017  . BREAST LUMPECTOMY Left 04/23/2017   Procedure: BREAST LUMPECTOMY WITH EXCISION OF SENTINEL NODE;  Surgeon: Robert Bellow, MD;  Location: [facility] ORS;  Service: General;  Laterality: Left;  . BREAST SURGERY Right    masectomy  . EYE SURGERY     cataracts bil  . FRACTURE SURGERY Left 2015 or 2016  . TUMOR EXCISION  2001    FAMILY HISTORY: Family History  Problem Relation Age of Onset  . Breast cancer Sister 25  . Lung cancer Mother 63       mets to breast  . Lung cancer Father 53  . Heart attack Brother   . Breast cancer Maternal Aunt        age unknown    ADVANCED DIRECTIVES (Y/N):  N  HEALTH MAINTENANCE: Social History  Substance  Use Topics  . Smoking status: Former Smoker    Packs/day: 0.25    Years: 10.00    Types: Cigarettes    Quit date: 23  . Smokeless tobacco: Never Used  . Alcohol use Yes     Comment: occasionally     Colonoscopy:  PAP:  Bone density:  Lipid panel:  Allergies  Allergen Reactions  . Oxycodone Other (See Comments)    Can't sleep, feels bad when taking.    Current Outpatient Prescriptions  Medication Sig Dispense Refill  . acetaminophen (TYLENOL) 500 MG  tablet Take 1,000 mg by mouth every 6 (six) hours as needed (for pain.).    Marland Kitchen ALPRAZolam (XANAX) 0.25 MG tablet Take 1 tablet (0.25 mg total) by mouth as needed for anxiety. 30 tablet 0  . Calcium Carb-Cholecalciferol (CALCIUM 600+D) 600-800 MG-UNIT TABS Take 1 tablet by mouth 2 (two) times daily.    . calcium carbonate (TUMS EX) 750 MG chewable tablet Chew 1-2 tablets by mouth 3 (three) times daily as needed (for heartburn/indigestion).    . cefadroxil (DURICEF) 500 MG capsule Take 1 capsule (500 mg total) by mouth 2 (two) times daily. Start one hour prior to office procedure on 05-18-17 20 capsule 0  . Cholecalciferol (VITAMIN D-3) 5000 units TABS Take 5,000 Units by mouth daily.    Marland Kitchen HYDROcodone-acetaminophen (NORCO) 5-325 MG tablet Take 1-2 tablets by mouth every 4 (four) hours as needed for moderate pain. 30 tablet 0  . magnesium oxide (MAG-OX) 400 MG tablet Take 400 mg by mouth at bedtime.     . Multiple Vitamin (MULTIVITAMIN WITH MINERALS) TABS tablet Take 1 tablet by mouth daily. Senior Multivitamin    . ondansetron (ZOFRAN) 8 MG tablet Take 1 tablet (8 mg total) by mouth 2 (two) times daily as needed for refractory nausea / vomiting. Start on day 3 after chemo. 60 tablet 2  . Potassium 99 MG TABS Take 99 mg by mouth 2 (two) times daily.     . prochlorperazine (COMPAZINE) 10 MG tablet Take 1 tablet (10 mg total) by mouth every 6 (six) hours as needed (Nausea or vomiting). 60 tablet 2  . vitamin C (ASCORBIC ACID) 500 MG tablet Take 500 mg by mouth daily.     No current facility-administered medications for this visit.     OBJECTIVE: There were no vitals filed for this visit.   There is no height or weight on file to calculate BMI.    ECOG FS:0 - Asymptomatic  General: Well-developed, well-nourished, no acute distress. Eyes: Pink conjunctiva, anicteric sclera. Breasts: Patient declined breast exam today. Lungs: Clear to auscultation bilaterally. Heart: Regular rate and rhythm. No rubs,  murmurs, or gallops. Abdomen: Soft, nontender, nondistended. No organomegaly noted, normoactive bowel sounds. Musculoskeletal: No edema, cyanosis, or clubbing. Neuro: Alert, answering all questions appropriately. Cranial nerves grossly intact. Skin: No rashes or petechiae noted. Psych: Normal affect.   LAB RESULTS:  Lab Results  Component Value Date   NA 139 06/25/2017   K 4.5 06/25/2017   CL 110 06/25/2017   CO2 23 06/25/2017   GLUCOSE 93 06/25/2017   BUN 17 06/25/2017   CREATININE 0.79 06/25/2017   CALCIUM 9.5 06/25/2017   PROT 6.9 06/25/2017   ALBUMIN 3.8 06/25/2017   AST 27 06/25/2017   ALT 22 06/25/2017   ALKPHOS 59 06/25/2017   BILITOT 0.5 06/25/2017   GFRNONAA >60 06/25/2017   GFRAA >60 06/25/2017    Lab Results  Component Value Date   WBC 10.1  07/16/2017   NEUTROABS 5.4 07/16/2017   HGB 12.5 07/16/2017   HCT 35.6 07/16/2017   MCV 85.4 07/16/2017   PLT 201 07/16/2017     STUDIES: No results found.  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  PLAN:    1. 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 This is likely a second primary. Plan to give 4 cycles of every 3 weeks. Patient will also require adjuvant XRT at the conclusion of her chemotherapy. An aromatase inhibitor would not be of any benefit given the ER/PR status of her tumor. Proceed with cycle 3 of 4 of Taxotere and Cytoxan last week. Return to clinic in 3 weeks for consideration of cycle 4.  2. 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 Oncotype score 17: Originally diagnosed in 2008. Patient completed 5 years of Arimidex in approximately August 2013. Biopsy of the right breast only revealed fat necrosis at the site of her previous  MammoSite. 3. Genetic testing: Patient has a sister and mother both with breast cancer. 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 4. Anxiety/insomnia: Patient has refused to take this medication. She is worried that she will become addicted. Spoke at length about the benefits of this medication to help her rest. Also explained that she is receiving a large doses of steroids during treatment which can affect her sleep.  Approximately 30 minutes was spent in discussion of which greater than 50% was consultation.  Patient expressed understanding and was in agreement with this plan. She also understands that She can call clinic at any time with any questions, concerns, or complaints.   Cancer Staging Carcinoma of upper-outer quadrant of left breast in female, estrogen receptor negative ([place]) Staging form: Breast, AJCC 8th Edition - Clinical stage from 04/08/2017: Stage IB (cT1c, cN0, cM0, G3, ER: Negative, PR: Negative, HER2: Negative) - Signed by Lloyd Huger, MD on 04/08/2017 - Pathologic stage from 05/11/2017: Stage IB (pT1c, pN0, cM0, G3, ER: Negative, PR: Negative, HER2: Negative) - Signed by Lloyd Huger, MD on 05/11/2017   Jacquelin Hawking, NP   07/16/2017 8:43 AM

## 2017-07-16 NOTE — Progress Notes (Signed)
Patient denies any concerns today.  

## 2017-08-04 NOTE — Progress Notes (Signed)
[place]  [phone] [phone]) [phone]  ID: Megan Santana OB: [birth year]  MR#: [id number]  DJS#:[id number]  Patient Care Team: Lynnell Jude, MD as PCP - General (Family Medicine) Clemmie Krill Lynnell Jude, MD as Referring Physician (Family Medicine) Bary Castilla Forest Gleason, MD (General Surgery)  CHIEF COMPLAINT: Pathologic stage IB triple negative invasive carcinoma of the upper outer quadrant of the left breast.  INTERVAL HISTORY: Patient returns to clinic today for further evaluation and consideration of cycle 4 of 4 of Taxotere and Cytoxan. She has tolerated her treatments well without significant side effects.  She has intermittent anxiety and insomnia, but otherwise feels well and is asymptomatic. She has no neurologic complaints. She denies any recent fevers or illnesses. She has a good appetite and denies weight loss. She has no chest pain or shortness of breath. She denies any nausea, vomiting, constipation, or diarrhea. She has no urinary complaints. Patient offers no further specific complaints today.  REVIEW OF SYSTEMS:   Review of Systems  Constitutional: Negative.  Negative for fever, malaise/fatigue and weight loss.  Respiratory: Negative.  Negative for cough and shortness of breath.   Cardiovascular: Negative.  Negative for chest pain and leg swelling.  Gastrointestinal: Negative.  Negative for abdominal pain.  Genitourinary: Negative.   Musculoskeletal: Negative.   Skin: Negative.  Negative for rash.  Neurological: Negative.  Negative for sensory change and weakness.  Psychiatric/Behavioral: The patient is nervous/anxious and has insomnia.     As per HPI. Otherwise, a complete review of systems is negative.  PAST MEDICAL HISTORY: Past Medical History:  Diagnosis Date  . Arthritis   . Breast cancer [facility]) 2007   right lumpectomy  . Breast cancer ([place]) 03/31/2017   16 mm invasive mammary carcinoma, left upper outer quadrant, T1c, N0, triple  negative, histologic grade 3.  . Family history of adverse reaction to anesthesia    mom n/v  . GERD (gastroesophageal reflux disease)   . Heart murmur   . Lumbar radiculitis   . Lumbar stenosis     PAST SURGICAL HISTORY: Past Surgical History:  Procedure Laterality Date  . ABDOMINAL HYSTERECTOMY     total  . BACK SURGERY     lumbar  . BREAST BIOPSY Bilateral 03/31/2017   bilat u/s core INVASIVE MAMMARY CARCINOMA  . BREAST BIOPSY Right 03/31/2017   DENSE FIBROSIS AND SHEETS OF MACROPHAGES  . BREAST LUMPECTOMY Right 2007  . BREAST LUMPECTOMY Left 04/23/2017  . BREAST LUMPECTOMY Left 04/23/2017   Procedure: BREAST LUMPECTOMY WITH EXCISION OF SENTINEL NODE;  Surgeon: Robert Bellow, MD;  Location: [facility] ORS;  Service: General;  Laterality: Left;  . BREAST SURGERY Right    masectomy  . EYE SURGERY     cataracts bil  . FRACTURE SURGERY Left 2015 or 2016  . TUMOR EXCISION  2001    FAMILY HISTORY: Family History  Problem Relation Age of Onset  . Breast cancer Sister 19  . Lung cancer Mother 37       mets to breast  . Lung cancer Father 38  . Heart attack Brother   . Breast cancer Maternal Aunt        age unknown    ADVANCED DIRECTIVES (Y/N):  N  HEALTH MAINTENANCE: Social History  Substance Use Topics  . Smoking status: Former Smoker    Packs/day: 0.25    Years: 10.00    Types: Cigarettes    Quit date: 99  . Smokeless tobacco: Never Used  . Alcohol  use Yes     Comment: occasionally     Colonoscopy:  PAP:  Bone density:  Lipid panel:  Allergies  Allergen Reactions  . Oxycodone Other (See Comments)    Can't sleep, feels bad when taking.    Current Outpatient Prescriptions  Medication Sig Dispense Refill  . acetaminophen (TYLENOL) 500 MG tablet Take 1,000 mg by mouth every 6 (six) hours as needed (for pain.).    Marland Kitchen Calcium Carb-Cholecalciferol (CALCIUM 600+D) 600-800 MG-UNIT TABS Take 1 tablet by mouth 2 (two) times daily.    . calcium carbonate  (TUMS EX) 750 MG chewable tablet Chew 1-2 tablets by mouth 3 (three) times daily as needed (for heartburn/indigestion).    . Cholecalciferol (VITAMIN D-3) 5000 units TABS Take 5,000 Units by mouth daily.    . magnesium oxide (MAG-OX) 400 MG tablet Take 400 mg by mouth at bedtime.     . Multiple Vitamin (MULTIVITAMIN WITH MINERALS) TABS tablet Take 1 tablet by mouth daily. Senior Multivitamin    . Potassium 99 MG TABS Take 99 mg by mouth 2 (two) times daily.     . vitamin C (ASCORBIC ACID) 500 MG tablet Take 500 mg by mouth daily.    Marland Kitchen ALPRAZolam (XANAX) 0.25 MG tablet Take 1 tablet (0.25 mg total) by mouth 2 (two) times daily as needed for anxiety. (Patient not taking: Reported on 08/06/2017) 30 tablet 0  . cefadroxil (DURICEF) 500 MG capsule Take 1 capsule (500 mg total) by mouth 2 (two) times daily. Start one hour prior to office procedure on 05-18-17 (Patient not taking: Reported on 07/16/2017) 20 capsule 0  . HYDROcodone-acetaminophen (NORCO) 5-325 MG tablet Take 1-2 tablets by mouth every 4 (four) hours as needed for moderate pain. (Patient not taking: Reported on 08/06/2017) 30 tablet 0  . ondansetron (ZOFRAN) 8 MG tablet Take 1 tablet (8 mg total) by mouth 2 (two) times daily as needed for refractory nausea / vomiting. Start on day 3 after chemo. (Patient not taking: Reported on 08/06/2017) 60 tablet 2  . prochlorperazine (COMPAZINE) 10 MG tablet Take 1 tablet (10 mg total) by mouth every 6 (six) hours as needed (Nausea or vomiting). (Patient not taking: Reported on 08/06/2017) 60 tablet 2   No current facility-administered medications for this visit.     OBJECTIVE: Vitals:   08/06/17 0856  BP: 129/74  Pulse: 75  Resp: 18  Temp: (!) 97.1 F (36.2 C)     Body mass index is 34.33 kg/m.    ECOG FS:0 - Asymptomatic  General: Well-developed, well-nourished, no acute distress. Eyes: Pink conjunctiva, anicteric sclera. Breasts: Patient declined breast exam today. Lungs: Clear to auscultation  bilaterally. Heart: Regular rate and rhythm. No rubs, murmurs, or gallops. Abdomen: Soft, nontender, nondistended. No organomegaly noted, normoactive bowel sounds. Musculoskeletal: No edema, cyanosis, or clubbing. Neuro: Alert, answering all questions appropriately. Cranial nerves grossly intact. Skin: No rashes or petechiae noted. Psych: Normal affect.   LAB RESULTS:  Lab Results  Component Value Date   NA 139 08/06/2017   K 4.0 08/06/2017   CL 110 08/06/2017   CO2 22 08/06/2017   GLUCOSE 112 (H) 08/06/2017   BUN 17 08/06/2017   CREATININE 0.76 08/06/2017   CALCIUM 9.2 08/06/2017   PROT 6.5 08/06/2017   ALBUMIN 3.9 08/06/2017   AST 25 08/06/2017   ALT 20 08/06/2017   ALKPHOS 51 08/06/2017   BILITOT 0.6 08/06/2017   GFRNONAA >60 08/06/2017   GFRAA >60 08/06/2017    Lab Results  Component Value Date   WBC 7.7 08/06/2017   NEUTROABS 4.1 08/06/2017   HGB 11.6 (L) 08/06/2017   HCT 33.4 (L) 08/06/2017   MCV 85.1 08/06/2017   PLT 194 08/06/2017     STUDIES: No results found.  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  PLAN:    1. 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 This is likely a second primary. Plan was to give 4 cycles every 3 weeks which she completes cycle 4 today. She received mammosite 05/18/17. Given her ER/PR status she would not benefit from an aromatase inhibitor. Ok to proceed with cycle 4 of 4 of taxotere and cytoxan today. Return to clinic in 3 months for follow up. Will plan for mammogram in 6 months.   2. 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 Oncotype score 17: Originally diagnosed in 2008. Patient completed 5 years of Arimidex in approximately August 2013. Biopsy of the right  breast only revealed fat necrosis at the site of her previous MammoSite. 3. Genetic testing: Patient has a sister and mother both with breast cancer. 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 4. Anxiety/insomnia: stable. Patient feels no additional intervention at this time.   Approximately 30 minutes was spent in discussion of which greater than 50% was consultation.  Patient expressed understanding and was in agreement with this plan. She also understands that She can call clinic at any time with any questions, concerns, or complaints.   Cancer Staging Carcinoma of upper-outer quadrant of left breast in female, estrogen receptor negative ([place]) Staging form: Breast, AJCC 8th Edition - Clinical stage from 04/08/2017: Stage IB (cT1c, cN0, cM0, G3, ER: Negative, PR: Negative, HER2: Negative) - Signed by Lloyd Huger, MD on 04/08/2017 - Pathologic stage from 05/11/2017: Stage IB (pT1c, pN0, cM0, G3, ER: Negative, PR: Negative, HER2: Negative) - Signed by Lloyd Huger, MD on 05/11/2017  Beverely Risen. Zenia Resides, NP 08/06/17 9:57 AM  Patient was seen and evaluated independently and I agree with the assessment and plan as dictated above.  Lloyd Huger, MD 08/08/17 7:12 AM

## 2017-08-06 ENCOUNTER — Inpatient Hospital Stay (HOSPITAL_BASED_OUTPATIENT_CLINIC_OR_DEPARTMENT_OTHER): Payer: Medicare PPO | Admitting: Oncology

## 2017-08-06 ENCOUNTER — Inpatient Hospital Stay: Payer: Medicare PPO

## 2017-08-06 VITALS — BP 129/74 | HR 75 | Temp 97.1°F | Resp 18 | Wt 187.7 lb

## 2017-08-06 DIAGNOSIS — F419 Anxiety disorder, unspecified: Secondary | ICD-10-CM

## 2017-08-06 DIAGNOSIS — Z79899 Other long term (current) drug therapy: Secondary | ICD-10-CM

## 2017-08-06 DIAGNOSIS — Z171 Estrogen receptor negative status [ER-]: Secondary | ICD-10-CM

## 2017-08-06 DIAGNOSIS — Z853 Personal history of malignant neoplasm of breast: Secondary | ICD-10-CM | POA: Diagnosis not present

## 2017-08-06 DIAGNOSIS — Z803 Family history of malignant neoplasm of breast: Secondary | ICD-10-CM | POA: Diagnosis not present

## 2017-08-06 DIAGNOSIS — Z87891 Personal history of nicotine dependence: Secondary | ICD-10-CM

## 2017-08-06 DIAGNOSIS — G4700 Insomnia, unspecified: Secondary | ICD-10-CM | POA: Diagnosis not present

## 2017-08-06 DIAGNOSIS — M48061 Spinal stenosis, lumbar region without neurogenic claudication: Secondary | ICD-10-CM | POA: Diagnosis not present

## 2017-08-06 DIAGNOSIS — C50412 Malignant neoplasm of upper-outer quadrant of left female breast: Secondary | ICD-10-CM

## 2017-08-06 DIAGNOSIS — R011 Cardiac murmur, unspecified: Secondary | ICD-10-CM

## 2017-08-06 DIAGNOSIS — Z5111 Encounter for antineoplastic chemotherapy: Secondary | ICD-10-CM | POA: Diagnosis not present

## 2017-08-06 DIAGNOSIS — K219 Gastro-esophageal reflux disease without esophagitis: Secondary | ICD-10-CM

## 2017-08-06 DIAGNOSIS — R4182 Altered mental status, unspecified: Secondary | ICD-10-CM | POA: Diagnosis not present

## 2017-08-06 LAB — COMPREHENSIVE METABOLIC PANEL
ALT: 20 U/L (ref 14–54)
ANION GAP: 7 (ref 5–15)
AST: 25 U/L (ref 15–41)
Albumin: 3.9 g/dL (ref 3.5–5.0)
Alkaline Phosphatase: 51 U/L (ref 38–126)
BUN: 17 mg/dL (ref 6–20)
CHLORIDE: 110 mmol/L (ref 101–111)
CO2: 22 mmol/L (ref 22–32)
Calcium: 9.2 mg/dL (ref 8.9–10.3)
Creatinine, Ser: 0.76 mg/dL (ref 0.44–1.00)
Glucose, Bld: 112 mg/dL — ABNORMAL HIGH (ref 65–99)
Potassium: 4 mmol/L (ref 3.5–5.1)
Sodium: 139 mmol/L (ref 135–145)
Total Bilirubin: 0.6 mg/dL (ref 0.3–1.2)
Total Protein: 6.5 g/dL (ref 6.5–8.1)

## 2017-08-06 LAB — CBC WITH DIFFERENTIAL/PLATELET
Basophils Absolute: 0.1 10*3/uL (ref 0–0.1)
Basophils Relative: 1 %
EOS ABS: 0 10*3/uL (ref 0–0.7)
EOS PCT: 0 %
HCT: 33.4 % — ABNORMAL LOW (ref 35.0–47.0)
Hemoglobin: 11.6 g/dL — ABNORMAL LOW (ref 12.0–16.0)
LYMPHS ABS: 2.6 10*3/uL (ref 1.0–3.6)
Lymphocytes Relative: 33 %
MCH: 29.6 pg (ref 26.0–34.0)
MCHC: 34.7 g/dL (ref 32.0–36.0)
MCV: 85.1 fL (ref 80.0–100.0)
MONO ABS: 0.8 10*3/uL (ref 0.2–0.9)
MONOS PCT: 11 %
Neutro Abs: 4.1 10*3/uL (ref 1.4–6.5)
Neutrophils Relative %: 55 %
PLATELETS: 194 10*3/uL (ref 150–440)
RBC: 3.92 MIL/uL (ref 3.80–5.20)
RDW: 15.6 % — AB (ref 11.5–14.5)
WBC: 7.7 10*3/uL (ref 3.6–11.0)

## 2017-08-06 MED ORDER — SODIUM CHLORIDE 0.9 % IV SOLN
600.0000 mg/m2 | Freq: Once | INTRAVENOUS | Status: AC
  Administered 2017-08-06: 1160 mg via INTRAVENOUS
  Filled 2017-08-06: qty 8

## 2017-08-06 MED ORDER — DOCETAXEL CHEMO INJECTION 160 MG/16ML
75.0000 mg/m2 | Freq: Once | INTRAVENOUS | Status: AC
  Administered 2017-08-06: 150 mg via INTRAVENOUS
  Filled 2017-08-06: qty 15

## 2017-08-06 MED ORDER — SODIUM CHLORIDE 0.9 % IV SOLN: 10.0000 mg | Freq: Once | INTRAVENOUS | Status: DC

## 2017-08-06 MED ORDER — SODIUM CHLORIDE 0.9 % IV SOLN
Freq: Once | INTRAVENOUS | Status: AC
  Administered 2017-08-06: 10:00:00 via INTRAVENOUS
  Filled 2017-08-06: qty 1000

## 2017-08-06 MED ORDER — DEXAMETHASONE SODIUM PHOSPHATE 10 MG/ML IJ SOLN
10.0000 mg | Freq: Once | INTRAMUSCULAR | Status: AC
  Administered 2017-08-06: 10 mg via INTRAVENOUS
  Filled 2017-08-06: qty 1

## 2017-08-06 MED ORDER — PALONOSETRON HCL INJECTION 0.25 MG/5ML
0.2500 mg | Freq: Once | INTRAVENOUS | Status: AC
Start: 2017-08-06 — End: 2017-08-06
  Administered 2017-08-06: 0.25 mg via INTRAVENOUS
  Filled 2017-08-06: qty 5

## 2017-08-06 NOTE — Progress Notes (Signed)
Patient is here today for follow up, she is doing well no major complaints. Her BP was good 129/74 and she allowed me to use her left arm.

## 2017-10-08 ENCOUNTER — Encounter: Payer: Self-pay | Admitting: General Surgery

## 2017-10-08 ENCOUNTER — Ambulatory Visit (INDEPENDENT_AMBULATORY_CARE_PROVIDER_SITE_OTHER): Payer: Medicare PPO | Admitting: General Surgery

## 2017-10-08 VITALS — BP 142/70 | HR 80 | Resp 12 | Ht 62.0 in | Wt 181.0 lb

## 2017-10-08 DIAGNOSIS — C50412 Malignant neoplasm of upper-outer quadrant of left female breast: Secondary | ICD-10-CM

## 2017-10-08 DIAGNOSIS — Z171 Estrogen receptor negative status [ER-]: Secondary | ICD-10-CM

## 2017-10-08 NOTE — Patient Instructions (Addendum)
The patient is aware to call back for any questions or concerns.  Patient to have a bilateral diagnostic mammogram( Dr Grayland Ormond ordered) follow up in 6 months.

## 2017-10-08 NOTE — Progress Notes (Signed)
Patient ID: Megan Santana, female   DOB: [birth year], 83 y.o.   MRN: [id number]  Chief Complaint  Patient presents with  . Follow-up    HPI Megan Santana is a 83 y.o. female.  Here for follow up left breast cancer. She completed her chemotherapy August 23. She states she is doing well.   HPI  Past Medical History:  Diagnosis Date  . Arthritis   . Breast cancer [facility]) 2007   right lumpectomy  . Breast cancer ([place]) 03/31/2017   16 mm invasive mammary carcinoma, left upper outer quadrant, T1c, N0, triple negative, histologic grade 3.  . Family history of adverse reaction to anesthesia    mom n/v  . GERD (gastroesophageal reflux disease)   . Heart murmur   . Lumbar radiculitis   . Lumbar stenosis     Past Surgical History:  Procedure Laterality Date  . ABDOMINAL HYSTERECTOMY     total  . BACK SURGERY     lumbar  . BREAST BIOPSY Bilateral 03/31/2017   bilat u/s core INVASIVE MAMMARY CARCINOMA  . BREAST BIOPSY Right 03/31/2017   DENSE FIBROSIS AND SHEETS OF MACROPHAGES  . BREAST LUMPECTOMY Right 2007  . BREAST LUMPECTOMY Left 04/23/2017  . BREAST LUMPECTOMY Left 04/23/2017   Procedure: BREAST LUMPECTOMY WITH EXCISION OF SENTINEL NODE;  Surgeon: Robert Bellow, MD;  Whole breast radiation ORS;  Service: General;  Laterality: Left;  . BREAST SURGERY Right    Wide excision  . EYE SURGERY     cataracts bil  . FRACTURE SURGERY Left 2015 or 2016  . TUMOR EXCISION  2001    Family History  Problem Relation Age of Onset  . Breast cancer Sister 52  . Lung cancer Mother 5       mets to breast  . Lung cancer Father 3  . Heart attack Brother   . Breast cancer Maternal Aunt        age unknown    Social History Social History  Substance Use Topics  . Smoking status: Former Smoker    Packs/day: 0.25    Years: 10.00    Types: Cigarettes    Quit date: 71  . Smokeless tobacco: Never Used  . Alcohol use Yes     Comment: occasionally    Allergies  Allergen  Reactions  . Oxycodone Other (See Comments)    Can't sleep, feels bad when taking.    Current Outpatient Prescriptions  Medication Sig Dispense Refill  . acetaminophen (TYLENOL) 500 MG tablet Take 1,000 mg by mouth every 6 (six) hours as needed (for pain.).    Marland Kitchen ALPRAZolam (XANAX) 0.25 MG tablet Take 1 tablet (0.25 mg total) by mouth 2 (two) times daily as needed for anxiety. 30 tablet 0  . Calcium Carb-Cholecalciferol (CALCIUM 600+D) 600-800 MG-UNIT TABS Take 1 tablet by mouth 2 (two) times daily.    . calcium carbonate (TUMS EX) 750 MG chewable tablet Chew 1-2 tablets by mouth 3 (three) times daily as needed (for heartburn/indigestion).    . Cholecalciferol (VITAMIN D-3) 5000 units TABS Take 5,000 Units by mouth daily.    . magnesium oxide (MAG-OX) 400 MG tablet Take 400 mg by mouth at bedtime.     . Multiple Vitamin (MULTIVITAMIN WITH MINERALS) TABS tablet Take 1 tablet by mouth daily. Senior Multivitamin    . ondansetron (ZOFRAN) 8 MG tablet Take 1 tablet (8 mg total) by mouth 2 (two) times daily as needed for refractory nausea / vomiting. Start on day  3 after chemo. 60 tablet 2  . Potassium 99 MG TABS Take 99 mg by mouth 2 (two) times daily.     . prochlorperazine (COMPAZINE) 10 MG tablet Take 1 tablet (10 mg total) by mouth every 6 (six) hours as needed (Nausea or vomiting). 60 tablet 2  . vitamin C (ASCORBIC ACID) 500 MG tablet Take 500 mg by mouth daily.     No current facility-administered medications for this visit.     Review of Systems Review of Systems  Constitutional: Negative.   Respiratory: Negative.   Cardiovascular: Negative.     Blood pressure (!) 142/70, pulse 80, resp. rate 12, height 5\' 2"  (1.575 m), weight 181 lb (82.1 kg), SpO2 98 %.  Physical Exam Physical Exam  Constitutional: She is oriented to person, place, and time. She appears well-developed and well-nourished.  HENT:  Mouth/Throat: Oropharynx is clear and moist.  Eyes: Conjunctivae are normal. No  scleral icterus.  Neck: Neck supple.  Cardiovascular: Normal rate, regular rhythm and normal heart sounds.   Pulmonary/Chest: Effort normal and breath sounds normal. Right breast exhibits no inverted nipple, no mass, no nipple discharge, no skin change and no tenderness. Left breast exhibits no inverted nipple, no mass, no nipple discharge, no skin change and no tenderness.    Minimal thickening along surgery site left breast  Lymphadenopathy:    She has no cervical adenopathy.    She has no axillary adenopathy.  Neurological: She is alert and oriented to person, place, and time.  Skin: Skin is warm and dry.  Psychiatric: Her behavior is normal.      Assessment    Doing well now 4 months out from wide excision, completion of adjuvant chemotherapy August 2018.    Plan         Patient to have a bilateral diagnostic mammogram( Dr Grayland Ormond ordered) follow up in 6 months.   HPI, Physical Exam, Assessment and Plan have been scribed under the direction and in the presence of Robert Bellow, MD. Karie Fetch, RN  I have completed the exam and reviewed the above documentation for accuracy and completeness.  I agree with the above.  Haematologist has been used and any errors in dictation or transcription are unintentional.  Hervey Ard, M.D., F.A.C.S.   Robert Bellow 10/08/2017, 9:05 PM

## 2017-11-10 NOTE — Progress Notes (Signed)
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  INTERVAL HISTORY: Patient returns to clinic today for routine 45-monthevaluation.  She currently feels well back to her baseline.  She has no neurologic complaints. She denies any recent fevers or illnesses. She has a good appetite and denies weight loss. She has no chest pain or shortness of breath. She denies any nausea, vomiting, constipation, or diarrhea. She has no urinary complaints. Patient offers no further specific complaints today.  REVIEW OF SYSTEMS:   Review of Systems  Constitutional: Negative.  Negative for fever, malaise/fatigue and weight loss.  Respiratory: Negative.  Negative for cough and shortness of breath.   Cardiovascular: Negative.  Negative for chest pain and leg swelling.  Gastrointestinal: Negative.  Negative for abdominal pain.  Genitourinary: Negative.   Musculoskeletal: Negative.   Skin: Negative.  Negative for rash.  Neurological: Negative.  Negative for sensory change and weakness.  Psychiatric/Behavioral: Negative.  The patient is not nervous/anxious and does not have insomnia.     As per HPI. Otherwise, a complete review of systems is negative.  PAST MEDICAL HISTORY: Past Medical History:  Diagnosis Date  . Arthritis   . Breast cancer ([facility] 2007   right lumpectomy  . [place]   16 mm invasive mammary carcinoma, left upper outer quadrant, T1c, N0, triple negative, histologic grade 3.  . Family history of adverse reaction to anesthesia    mom n/v  . GERD (gastroesophageal reflux disease)     . Heart murmur   . Lumbar radiculitis   . Lumbar stenosis     PAST SURGICAL HISTORY: Past Surgical History:  Procedure Laterality Date  . ABDOMINAL HYSTERECTOMY     total  . BACK SURGERY     lumbar  . BREAST BIOPSY Bilateral 03/31/2017   bilat u/s core INVASIVE MAMMARY CARCINOMA  . BREAST BIOPSY Right 03/31/2017   DENSE FIBROSIS AND SHEETS OF MACROPHAGES  . BREAST LUMPECTOMY Right 2007  . BREAST LUMPECTOMY Left 04/23/2017  . BREAST LUMPECTOMY Left 04/23/2017   Procedure: BREAST LUMPECTOMY WITH EXCISION OF SENTINEL NODE;  Surgeon: BRobert Bellow MD;  Whole breast radiation ORS;  Service: General;  Laterality: Left;  . BREAST SURGERY Right    Wide excision  . EYE SURGERY     cataracts bil  . FRACTURE SURGERY Left 2015 or 2016  . TUMOR EXCISION  2001    FAMILY HISTORY: Family History  Problem Relation Age of Onset  . Breast cancer Sister 567 . Lung cancer Mother 621      mets to breast  . Lung cancer Father 435 . Heart attack Brother   . Breast cancer Maternal Aunt        age unknown    ADVANCED DIRECTIVES (Y/N):  N  HEALTH MAINTENANCE: Social History   Tobacco Use  . Smoking status: Former Smoker    Packs/day: 0.25    Years: 10.00    Pack years: 2.50    Types: Cigarettes    Last attempt to quit: 1970    Years since quitting: 48.9  . Smokeless tobacco: Never Used  Substance Use Topics  .  Alcohol use: Yes    Comment: occasionally  . Drug use: No     Colonoscopy:  PAP:  Bone density:  Lipid panel:  Allergies  Allergen Reactions  . Oxycodone Other (See Comments)    Can't sleep, feels bad when taking.    Current Outpatient Medications  Medication Sig Dispense Refill  . Calcium Carb-Cholecalciferol (CALCIUM 600+D) 600-800 MG-UNIT TABS Take 1 tablet by mouth 2 (two) times daily.    . Cholecalciferol (VITAMIN D-3) 5000 units TABS Take 5,000 Units by mouth daily.    . magnesium oxide (MAG-OX) 400 MG tablet Take 400 mg by mouth at bedtime.     .  Multiple Vitamin (MULTIVITAMIN WITH MINERALS) TABS tablet Take 1 tablet by mouth daily. Senior Multivitamin    . Potassium 99 MG TABS Take 99 mg by mouth 2 (two) times daily.     . vitamin C (ASCORBIC ACID) 500 MG tablet Take 500 mg by mouth daily.    Marland Kitchen acetaminophen (TYLENOL) 500 MG tablet Take 1,000 mg by mouth every 6 (six) hours as needed (for pain.).    Marland Kitchen ALPRAZolam (XANAX) 0.25 MG tablet Take 1 tablet (0.25 mg total) by mouth 2 (two) times daily as needed for anxiety. (Patient not taking: Reported on 11/12/2017) 30 tablet 0  . calcium carbonate (TUMS EX) 750 MG chewable tablet Chew 1-2 tablets by mouth 3 (three) times daily as needed (for heartburn/indigestion).    . ondansetron (ZOFRAN) 8 MG tablet Take 1 tablet (8 mg total) by mouth 2 (two) times daily as needed for refractory nausea / vomiting. Start on day 3 after chemo. (Patient not taking: Reported on 11/12/2017) 60 tablet 2  . prochlorperazine (COMPAZINE) 10 MG tablet Take 1 tablet (10 mg total) by mouth every 6 (six) hours as needed (Nausea or vomiting). (Patient not taking: Reported on 11/12/2017) 60 tablet 2   No current facility-administered medications for this visit.     OBJECTIVE: Vitals:   11/12/17 1445  BP: 138/83  Pulse: 84  Resp: 18  Temp: 98.1 F (36.7 C)     Body mass index is 33.89 kg/m.    ECOG FS:0 - Asymptomatic  General: Well-developed, well-nourished, no acute distress. Eyes: Pink conjunctiva, anicteric sclera. Breasts: Patient declined breast exam today. Lungs: Clear to auscultation bilaterally. Heart: Regular rate and rhythm. No rubs, murmurs, or gallops. Abdomen: Soft, nontender, nondistended. No organomegaly noted, normoactive bowel sounds. Musculoskeletal: No edema, cyanosis, or clubbing. Neuro: Alert, answering all questions appropriately. Cranial nerves grossly intact. Skin: No rashes or petechiae noted. Psych: Normal affect.   LAB RESULTS:  Lab Results  Component Value Date   NA 138  11/12/2017   K 4.1 11/12/2017   CL 106 11/12/2017   CO2 22 11/12/2017   GLUCOSE 169 (H) 11/12/2017   BUN 18 11/12/2017   CREATININE 0.86 11/12/2017   CALCIUM 9.3 11/12/2017   PROT 7.2 11/12/2017   ALBUMIN 4.4 11/12/2017   AST 29 11/12/2017   ALT 22 11/12/2017   ALKPHOS 69 11/12/2017   BILITOT 0.5 11/12/2017   GFRNONAA >60 11/12/2017   GFRAA >60 11/12/2017    Lab Results  Component Value Date   WBC 9.8 11/12/2017   NEUTROABS 4.8 11/12/2017   HGB 13.1 11/12/2017   HCT 39.1 11/12/2017   MCV 85.8 11/12/2017   PLT 179 11/12/2017     STUDIES: No results found.  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  PLAN:  1. 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 This is likely a second primary.  Patient completed 4 cycles of Taxotere and Cytoxan August 06, 2017.  Also completed adjuvant XRT.  An aromatase inhibitor would not be of any benefit given the ER/PR status of her tumor. Return to clinic in 6 months for further evaluation.   2. 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 Oncotype score 17: Originally diagnosed in 2008. Patient completed 5 years of Arimidex in approximately August 2013. Biopsy of the right breast only revealed fat necrosis at the site of her previous MammoSite. 3. Genetic testing: Patient has a sister and mother both with breast cancer. 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 4. Anxiety/insomnia: Patient was previously given a prescription for Xanax.  Approximately 20 minutes was spent in discussion of which greater than 50% was consultation.  Patient expressed understanding and was in agreement with this plan. She also understands that She can  call clinic at any time with any questions, concerns, or complaints.   Cancer Staging Carcinoma of upper-outer quadrant of left breast in female, estrogen receptor negative ([place]) Staging form: Breast, AJCC 8th Edition - Clinical stage from 04/08/2017: Stage IB (cT1c, cN0, cM0, G3, ER: Negative, PR: Negative, HER2: Negative) - Signed by Lloyd Huger, MD on 04/08/2017 - Pathologic stage from 05/11/2017: Stage IB (pT1c, pN0, cM0, G3, ER: Negative, PR: Negative, HER2: Negative) - Signed by Lloyd Huger, MD on 05/11/2017   Lloyd Huger, MD   11/14/2017 10:29 AM

## 2017-11-12 ENCOUNTER — Inpatient Hospital Stay: Payer: Medicare PPO | Attending: Oncology

## 2017-11-12 ENCOUNTER — Inpatient Hospital Stay (HOSPITAL_BASED_OUTPATIENT_CLINIC_OR_DEPARTMENT_OTHER): Payer: Medicare PPO | Admitting: Oncology

## 2017-11-12 VITALS — BP 138/83 | HR 84 | Temp 98.1°F | Resp 18 | Wt 185.3 lb

## 2017-11-12 DIAGNOSIS — F419 Anxiety disorder, unspecified: Secondary | ICD-10-CM | POA: Insufficient documentation

## 2017-11-12 DIAGNOSIS — Z170 Estrogen receptor positive status [ER+]: Secondary | ICD-10-CM | POA: Insufficient documentation

## 2017-11-12 DIAGNOSIS — Z923 Personal history of irradiation: Secondary | ICD-10-CM | POA: Insufficient documentation

## 2017-11-12 DIAGNOSIS — Z853 Personal history of malignant neoplasm of breast: Secondary | ICD-10-CM | POA: Insufficient documentation

## 2017-11-12 DIAGNOSIS — Z87891 Personal history of nicotine dependence: Secondary | ICD-10-CM

## 2017-11-12 DIAGNOSIS — Z9221 Personal history of antineoplastic chemotherapy: Secondary | ICD-10-CM | POA: Diagnosis not present

## 2017-11-12 DIAGNOSIS — G4700 Insomnia, unspecified: Secondary | ICD-10-CM | POA: Diagnosis not present

## 2017-11-12 DIAGNOSIS — C50412 Malignant neoplasm of upper-outer quadrant of left female breast: Secondary | ICD-10-CM

## 2017-11-12 DIAGNOSIS — Z801 Family history of malignant neoplasm of trachea, bronchus and lung: Secondary | ICD-10-CM | POA: Insufficient documentation

## 2017-11-12 DIAGNOSIS — Z803 Family history of malignant neoplasm of breast: Secondary | ICD-10-CM | POA: Insufficient documentation

## 2017-11-12 DIAGNOSIS — Z79899 Other long term (current) drug therapy: Secondary | ICD-10-CM | POA: Diagnosis not present

## 2017-11-12 DIAGNOSIS — Z171 Estrogen receptor negative status [ER-]: Secondary | ICD-10-CM | POA: Diagnosis not present

## 2017-11-12 DIAGNOSIS — Z90710 Acquired absence of both cervix and uterus: Secondary | ICD-10-CM

## 2017-11-12 DIAGNOSIS — K219 Gastro-esophageal reflux disease without esophagitis: Secondary | ICD-10-CM | POA: Diagnosis not present

## 2017-11-12 DIAGNOSIS — M48061 Spinal stenosis, lumbar region without neurogenic claudication: Secondary | ICD-10-CM

## 2017-11-12 LAB — COMPREHENSIVE METABOLIC PANEL
ALBUMIN: 4.4 g/dL (ref 3.5–5.0)
ALK PHOS: 69 U/L (ref 38–126)
ALT: 22 U/L (ref 14–54)
ANION GAP: 10 (ref 5–15)
AST: 29 U/L (ref 15–41)
BUN: 18 mg/dL (ref 6–20)
CALCIUM: 9.3 mg/dL (ref 8.9–10.3)
CO2: 22 mmol/L (ref 22–32)
Chloride: 106 mmol/L (ref 101–111)
Creatinine, Ser: 0.86 mg/dL (ref 0.44–1.00)
GFR calc Af Amer: >60 mL/min (ref >60)
GFR calc non Af Amer: >60 mL/min (ref >60)
GLUCOSE: 169 mg/dL — AB (ref 65–99)
POTASSIUM: 4.1 mmol/L (ref 3.5–5.1)
SODIUM: 138 mmol/L (ref 135–145)
Total Bilirubin: 0.5 mg/dL (ref 0.3–1.2)
Total Protein: 7.2 g/dL (ref 6.5–8.1)

## 2017-11-12 LAB — CBC WITH DIFFERENTIAL/PLATELET
Basophils Absolute: 0.1 10*3/uL (ref 0–0.1)
Basophils Relative: 1 %
Eosinophils Absolute: 0.2 10*3/uL (ref 0–0.7)
Eosinophils Relative: 2 %
HEMATOCRIT: 39.1 % (ref 35.0–47.0)
Hemoglobin: 13.1 g/dL (ref 12.0–16.0)
LYMPHS PCT: 45 %
Lymphs Abs: 4.4 10*3/uL — ABNORMAL HIGH (ref 1.0–3.6)
MCH: 28.7 pg (ref 26.0–34.0)
MCHC: 33.5 g/dL (ref 32.0–36.0)
MCV: 85.8 fL (ref 80.0–100.0)
MONO ABS: 0.3 10*3/uL (ref 0.2–0.9)
MONOS PCT: 3 %
NEUTROS ABS: 4.8 10*3/uL (ref 1.4–6.5)
Neutrophils Relative %: 49 %
Platelets: 179 10*3/uL (ref 150–440)
RBC: 4.56 MIL/uL (ref 3.80–5.20)
RDW: 14.1 % (ref 11.5–14.5)
WBC: 9.8 10*3/uL (ref 3.6–11.0)

## 2017-11-18 ENCOUNTER — Other Ambulatory Visit: Payer: Self-pay

## 2017-11-18 ENCOUNTER — Ambulatory Visit
Admission: RE | Admit: 2017-11-18 | Discharge: 2017-11-18 | Disposition: A | Discharge status: Home / Self Care | Payer: Medicare PPO | Source: Ambulatory Visit | Attending: Radiation Oncology | Admitting: Radiation Oncology

## 2017-11-18 ENCOUNTER — Encounter: Payer: Self-pay | Admitting: Radiation Oncology

## 2017-11-18 VITALS — BP 143/82 | HR 90 | Temp 97.7°F | Resp 20 | Wt 183.8 lb

## 2017-11-18 DIAGNOSIS — Z923 Personal history of irradiation: Secondary | ICD-10-CM | POA: Insufficient documentation

## 2017-11-18 DIAGNOSIS — C50412 Malignant neoplasm of upper-outer quadrant of left female breast: Secondary | ICD-10-CM | POA: Diagnosis present

## 2017-11-18 DIAGNOSIS — Z171 Estrogen receptor negative status [ER-]: Secondary | ICD-10-CM | POA: Insufficient documentation

## 2017-11-18 NOTE — Progress Notes (Signed)
Radiation Oncology Follow up Note  Name: Megan Santana   Date:   11/18/2017 MRN:  [id number] DOB: 01/12/1934    This 83 y.o. female presents to the clinic today for 5 month follow-up status post accelerated partial breast radiation to her left breast for stage I ER/PR negative invasive mammary carcinoma.  REFERRING PROVIDER: Lynnell Jude, MD  HPI: Patient is a 83 year old female now out 5 months having completed accelerated partial breast irradiation to her left breast for stage I ER/PR negative invasive mammary carcinoma. Seen today in routine follow-up she is doing well. She specifically denies breast tenderness cough or bone pain. She is not on antiestrogen therapy..  COMPLICATIONS OF TREATMENT: none  FOLLOW UP COMPLIANCE: keeps appointments   PHYSICAL EXAM:  BP (!) 143/82   Pulse 90   Temp 97.7 F (36.5 C)   Resp 20   Wt 183 lb 12.1 oz (83.3 kg)   BMI 33.61 kg/m  Lungs are clear to A&P cardiac examination essentially unremarkable with regular rate and rhythm. No dominant mass or nodularity is noted in either breast in 2 positions examined. Incision is well-healed. No axillary or supraclavicular adenopathy is appreciated. Cosmetic result is excellent. Well-developed well-nourished patient in NAD. HEENT reveals PERLA, EOMI, discs not visualized.  Oral cavity is clear. No oral mucosal lesions are identified. Neck is clear without evidence of cervical or supraclavicular adenopathy. Lungs are clear to A&P. Cardiac examination is essentially unremarkable with regular rate and rhythm without murmur rub or thrill. Abdomen is benign with no organomegaly or masses noted. Motor sensory and DTR levels are equal and symmetric in the upper and lower extremities. Cranial nerves II through XII are grossly intact. Proprioception is intact. No peripheral adenopathy or edema is identified. No motor or sensory levels are noted. Crude visual fields are within normal range.  RADIOLOGY RESULTS: No  current films for review  PLAN: Present time patient is doing well with no significant side effects from accelerated partial breast radiation. I'm please were overall progress. I've asked to see her back in 6 months for follow-up. Surgeon his ordering follow-up mammograms. Patient knows to call at anytime with any concerns.  I would like to take this opportunity to thank you for allowing me to participate in the care of your patient.Armstead Peaks., MD

## 2018-03-23 ENCOUNTER — Ambulatory Visit
Admission: RE | Admit: 2018-03-23 | Discharge: 2018-03-23 | Disposition: A | Discharge status: Home / Self Care | Payer: Medicare HMO | Source: Ambulatory Visit | Attending: Nurse Practitioner | Admitting: Nurse Practitioner

## 2018-03-23 DIAGNOSIS — Z171 Estrogen receptor negative status [ER-]: Secondary | ICD-10-CM | POA: Diagnosis not present

## 2018-03-23 DIAGNOSIS — C50412 Malignant neoplasm of upper-outer quadrant of left female breast: Secondary | ICD-10-CM | POA: Diagnosis present

## 2018-03-23 HISTORY — DX: Personal history of irradiation: Z92.3

## 2018-03-23 HISTORY — DX: Personal history of antineoplastic chemotherapy: Z92.21

## 2018-03-30 ENCOUNTER — Ambulatory Visit (INDEPENDENT_AMBULATORY_CARE_PROVIDER_SITE_OTHER): Payer: Medicare HMO | Admitting: General Surgery

## 2018-03-30 ENCOUNTER — Encounter: Payer: Self-pay | Admitting: General Surgery

## 2018-03-30 VITALS — BP 132/62 | HR 77 | Resp 14 | Ht 62.0 in | Wt 187.0 lb

## 2018-03-30 DIAGNOSIS — Z171 Estrogen receptor negative status [ER-]: Secondary | ICD-10-CM | POA: Diagnosis not present

## 2018-03-30 DIAGNOSIS — Z853 Personal history of malignant neoplasm of breast: Secondary | ICD-10-CM

## 2018-03-30 DIAGNOSIS — C50412 Malignant neoplasm of upper-outer quadrant of left female breast: Secondary | ICD-10-CM | POA: Diagnosis not present

## 2018-03-30 NOTE — Patient Instructions (Addendum)
The patient is aware to call back for any questions or new concerns.  The patient has been asked to return to the office in one year with a bilateral diagnostic mammogram.  Call if forehead nodule not resolved at end of month.

## 2018-03-30 NOTE — Progress Notes (Signed)
Patient ID: Megan Santana, female   DOB: Jan 15, 1934, 84 y.o.   MRN: [id number]  Chief Complaint  Patient presents with  . Follow-up    Megan Santana is a 84 y.o. female.  who presents for her follow up breast cancer and a breast evaluation. The most recent mammogram was done on 03-23-18.  Patient does perform regular self breast checks and gets regular mammograms done.   No new breast issues. Occasional right nipple discharge. She does admit to a tender knot on her right forehead that she does not remember how she got it.  Megan  Past Medical History:  Diagnosis Date  . Arthritis   . Breast cancer [facility]) 2007   right lumpectomy  . Breast cancer ([place]) 03/31/2017   16 mm invasive mammary carcinoma, left upper outer quadrant, T1c, N0, triple negative, histologic grade 3.  . Family history of adverse reaction to anesthesia    mom n/v  . GERD (gastroesophageal reflux disease)   . Heart murmur   . Lumbar radiculitis   . Lumbar stenosis   . Personal history of chemotherapy 2018   Left  . Personal history of radiation therapy 2007   Right  . Personal history of radiation therapy 2018   Left    Past Surgical History:  Procedure Laterality Date  . ABDOMINAL HYSTERECTOMY     total  . BACK SURGERY     lumbar  . BREAST BIOPSY Bilateral 03/31/2017   bilat u/s core INVASIVE MAMMARY CARCINOMA  . BREAST BIOPSY Right 03/31/2017   DENSE FIBROSIS AND SHEETS OF MACROPHAGES  . BREAST EXCISIONAL BIOPSY Left 2018  . BREAST LUMPECTOMY Right 2007  . BREAST LUMPECTOMY Left 04/23/2017  . BREAST LUMPECTOMY Left 04/23/2017   Procedure: BREAST LUMPECTOMY WITH EXCISION OF SENTINEL NODE;  Surgeon: Robert Bellow, MD;  Whole breast radiation ORS;  Service: General;  Laterality: Left;  . BREAST SURGERY Right    Wide excision  . EYE SURGERY     cataracts bil  . FRACTURE SURGERY Left 2015 or 2016  . TUMOR EXCISION  2001    Family History  Problem Relation Age of Onset  . Breast cancer  Sister 11  . Lung cancer Mother 15       mets to breast  . Lung cancer Father 33  . Heart attack Brother   . Breast cancer Maternal Aunt        age unknown  . Stroke Sister 71    Social History Social History   Tobacco Use  . Smoking status: Former Smoker    Packs/day: 0.25    Years: 10.00    Pack years: 2.50    Types: Cigarettes    Last attempt to quit: 1970    Years since quitting: 49.3  . Smokeless tobacco: Never Used  Substance Use Topics  . Alcohol use: Yes    Comment: occasionally  . Drug use: No    Allergies  Allergen Reactions  . Oxycodone Other (See Comments)    Can't sleep, feels bad when taking.    Current Outpatient Medications  Medication Sig Dispense Refill  . acetaminophen (TYLENOL) 500 MG tablet Take 1,000 mg by mouth every 6 (six) hours as needed (for pain.).    Marland Kitchen ALPRAZolam (XANAX) 0.25 MG tablet Take 1 tablet (0.25 mg total) by mouth 2 (two) times daily as needed for anxiety. 30 tablet 0  . Calcium Carb-Cholecalciferol (CALCIUM 600+D) 600-800 MG-UNIT TABS Take 1 tablet by mouth 2 (two) times  daily.    . calcium carbonate (TUMS EX) 750 MG chewable tablet Chew 1-2 tablets by mouth 3 (three) times daily as needed (for heartburn/indigestion).    . Cholecalciferol (VITAMIN D-3) 5000 units TABS Take 5,000 Units by mouth daily.    . magnesium oxide (MAG-OX) 400 MG tablet Take 250 mg by mouth daily.     . Multiple Vitamin (MULTIVITAMIN WITH MINERALS) TABS tablet Take 1 tablet by mouth daily. Senior Multivitamin    . ondansetron (ZOFRAN) 8 MG tablet Take 1 tablet (8 mg total) by mouth 2 (two) times daily as needed for refractory nausea / vomiting. Start on day 3 after chemo. 60 tablet 2  . Potassium 99 MG TABS Take 99 mg by mouth daily.     . prochlorperazine (COMPAZINE) 10 MG tablet Take 1 tablet (10 mg total) by mouth every 6 (six) hours as needed (Nausea or vomiting). 60 tablet 2  . vitamin C (ASCORBIC ACID) 500 MG tablet Take 500 mg by mouth daily.     No  current facility-administered medications for this visit.     Review of Systems Review of Systems  Constitutional: Negative.   Respiratory: Negative.   Cardiovascular: Negative.     Blood pressure 132/62, pulse 77, resp. rate 14, height 5\' 2"  (1.575 m), weight 187 lb (84.8 kg), SpO2 98 %.  Physical Exam Physical Exam  Constitutional: She is oriented to person, place, and time. She appears well-developed and well-nourished.  HENT:  Head:    Mouth/Throat: Oropharynx is clear and moist.  Right forehead knot, no bruising.  Eyes: Conjunctivae are normal. No scleral icterus.  Neck: Neck supple.  Cardiovascular: Normal rate, regular rhythm and normal heart sounds.  Pulmonary/Chest: Effort normal and breath sounds normal. Right breast exhibits inverted nipple. Right breast exhibits no mass, no nipple discharge, no skin change and no tenderness. Left breast exhibits inverted nipple. Left breast exhibits no mass, no nipple discharge, no skin change and no tenderness.    Lumpectomy incisions well healed.  Lymphadenopathy:    She has no cervical adenopathy.    She has no axillary adenopathy.       Right: No supraclavicular adenopathy present.       Left: No supraclavicular adenopathy present.  Neurological: She is alert and oriented to person, place, and time.  Skin: Skin is warm and dry.  Psychiatric: Her behavior is normal.    Data Reviewed Bilateral diagnostic mammograms of March 23, 2018 were reviewed.  Postsurgical changes.  Dense scarring on the right.  BI-RADS-2.  Assessment    Benign breast exam with stable nodularity consistent with previous surgery and radiation.  New likely bony prominence on the right frontal area.  History of trauma.    Plan    Call if forehead nodule not resolved at end of month.  The patient is scheduled to meet with medical oncology at the end of this month.  She is been encouraged to have this area evaluated by the physician at that time.  The  patient has been asked to return to the office in one year with a bilateral diagnostic mammogram.      Megan, Physical Exam, Assessment and Plan have been scribed under the direction and in the presence of Robert Bellow, MD. Megan Fetch, Megan Santana  I have completed the exam and reviewed the above documentation for accuracy and completeness.  I agree with the above.  Haematologist has been used and any errors in dictation or transcription are unintentional.  Hervey Ard, M.D., F.A.C.S.   Forest Gleason  03/30/2018, 10:55 AM

## 2018-05-10 NOTE — Progress Notes (Signed)
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  INTERVAL HISTORY: Patient returns to clinic today for further evaluation and routine six-month follow-up.  She continues to feel well and remains asymptomatic. She has no neurologic complaints. She denies any recent fevers or illnesses. She has a good appetite and denies weight loss. She has no chest pain or shortness of breath. She denies any nausea, vomiting, constipation, or diarrhea. She has no urinary complaints.  Patient feels at her baseline offers no specific complaints today.  REVIEW OF SYSTEMS:   Review of Systems  Constitutional: Negative.  Negative for fever, malaise/fatigue and weight loss.  Respiratory: Negative.  Negative for cough and shortness of breath.   Cardiovascular: Negative.  Negative for chest pain and leg swelling.  Gastrointestinal: Negative.  Negative for abdominal pain and constipation.  Genitourinary: Negative.  Negative for dysuria.  Musculoskeletal: Negative.  Negative for back pain.  Skin: Negative.  Negative for rash.  Neurological: Negative.  Negative for sensory change, focal weakness and weakness.  Psychiatric/Behavioral: Negative.  The patient is not nervous/anxious and does not have insomnia.     As per HPI. Otherwise, a complete review of systems is negative.  PAST MEDICAL HISTORY: Past Medical History:  Diagnosis Date  . Arthritis   .  Breast cancer [facility]) 2007   right lumpectomy  . Breast cancer ([place]) 03/31/2017   16 mm invasive mammary carcinoma, left upper outer quadrant, T1c, N0, triple negative, histologic grade 3.  . Family history of adverse reaction to anesthesia    mom n/v  . GERD (gastroesophageal reflux disease)   . Heart murmur   . Lumbar radiculitis   . Lumbar stenosis   . Personal history of chemotherapy 2018   Left  . Personal history of radiation therapy 2007   Right  . Personal history of radiation therapy 2018   Left    PAST SURGICAL HISTORY: Past Surgical History:  Procedure Laterality Date  . ABDOMINAL HYSTERECTOMY     total  . BACK SURGERY     lumbar  . BREAST BIOPSY Bilateral 03/31/2017   bilat u/s core INVASIVE MAMMARY CARCINOMA  . BREAST BIOPSY Right 03/31/2017   DENSE FIBROSIS AND SHEETS OF MACROPHAGES  . BREAST EXCISIONAL BIOPSY Left 2018  . BREAST LUMPECTOMY Right 2007  . BREAST LUMPECTOMY Left 04/23/2017  . BREAST LUMPECTOMY Left 04/23/2017   Procedure: BREAST LUMPECTOMY WITH EXCISION OF SENTINEL NODE;  Surgeon: Robert Bellow, MD;  Whole breast radiation ORS;  Service: General;  Laterality: Left;  . BREAST SURGERY Right    Wide excision  . EYE SURGERY     cataracts bil  . FRACTURE SURGERY Left 2015 or 2016  . TUMOR EXCISION  2001    FAMILY HISTORY: Family History  Problem Relation Age of Onset  . Breast cancer Sister 20  . Lung cancer Mother 13       mets to breast  . Lung cancer Father 53  . Heart attack Brother   . Breast cancer Maternal  Aunt        age unknown  . Stroke Sister 3    ADVANCED DIRECTIVES (Y/N):  N  HEALTH MAINTENANCE: Social History   Tobacco Use  . Smoking status: Former Smoker    Packs/day: 0.25    Years: 10.00    Pack years: 2.50    Types: Cigarettes    Last attempt to quit: 1970    Years since quitting: 49.4  . Smokeless tobacco: Never Used  Substance Use Topics  . Alcohol use: Yes    Comment: occasionally  . Drug use: No      Colonoscopy:  PAP:  Bone density:  Lipid panel:  Allergies  Allergen Reactions  . Oxycodone Other (See Comments)    Can't sleep, feels bad when taking.    Current Outpatient Medications  Medication Sig Dispense Refill  . Calcium Carb-Cholecalciferol (CALCIUM 600+D) 600-800 MG-UNIT TABS Take 1 tablet by mouth every morning.     . Cholecalciferol (VITAMIN D-3) 5000 units TABS Take 5,000 Units by mouth daily.    . magnesium oxide (MAG-OX) 400 MG tablet Take 250 mg by mouth daily.     . Multiple Vitamin (MULTIVITAMIN WITH MINERALS) TABS tablet Take 1 tablet by mouth daily. Senior Multivitamin    . Potassium 99 MG TABS Take 99 mg by mouth daily.     . vitamin C (ASCORBIC ACID) 500 MG tablet Take 500 mg by mouth daily.    Marland Kitchen acetaminophen (TYLENOL) 500 MG tablet Take 1,000 mg by mouth every 6 (six) hours as needed (for pain.).    Marland Kitchen ALPRAZolam (XANAX) 0.25 MG tablet Take 1 tablet (0.25 mg total) by mouth 2 (two) times daily as needed for anxiety. (Patient not taking: Reported on 05/12/2018) 30 tablet 0  . calcium carbonate (TUMS EX) 750 MG chewable tablet Chew 1-2 tablets by mouth 3 (three) times daily as needed (for heartburn/indigestion).     No current facility-administered medications for this visit.     OBJECTIVE: Vitals:   05/12/18 1403  BP: 139/74  Pulse: 82  Resp: 18  Temp: 98.6 F (37 C)     Body mass index is 34.2 kg/m.    ECOG FS:0 - Asymptomatic  General: Well-developed, well-nourished, no acute distress. Eyes: Pink conjunctiva, anicteric sclera. Breast: Bilateral breast and axilla without lumps or masses. Lungs: Clear to auscultation bilaterally. Heart: Regular rate and rhythm. No rubs, murmurs, or gallops. Abdomen: Soft, nontender, nondistended. No organomegaly noted, normoactive bowel sounds. Musculoskeletal: No edema, cyanosis, or clubbing. Neuro: Alert, answering all questions appropriately. Cranial nerves grossly intact. Skin: No rashes or petechiae  noted. Psych: Normal affect.  LAB RESULTS:  Lab Results  Component Value Date   NA 138 11/12/2017   K 4.1 11/12/2017   CL 106 11/12/2017   CO2 22 11/12/2017   GLUCOSE 169 (H) 11/12/2017   BUN 18 11/12/2017   CREATININE 0.86 11/12/2017   CALCIUM 9.3 11/12/2017   PROT 7.2 11/12/2017   ALBUMIN 4.4 11/12/2017   AST 29 11/12/2017   ALT 22 11/12/2017   ALKPHOS 69 11/12/2017   BILITOT 0.5 11/12/2017   GFRNONAA >60 11/12/2017   GFRAA >60 11/12/2017    Lab Results  Component Value Date   WBC 9.8 11/12/2017   NEUTROABS 4.8 11/12/2017   HGB 13.1 11/12/2017   HCT 39.1 11/12/2017   MCV 85.8 11/12/2017   PLT 179 11/12/2017     STUDIES: No results found.  ASSESSMENT: Pathologic stage IB triple negative invasive carcinoma of the upper outer  quadrant of the left breast.  PLAN:    1. 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 This is likely a second primary.  Patient completed 4 cycles of Taxotere and Cytoxan August 06, 2017.  Also completed adjuvant XRT.  An aromatase inhibitor would not be of any benefit given the ER/PR status of her tumor.  Patient's most recent mammogram on March 23, 2018 was reported as BI-RADS 2.  Repeat in April 2020.  Return to clinic in 6 months for further evaluation.  2. 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 Oncotype score 17: Originally diagnosed in 2008. Patient completed 5 years of Arimidex in approximately August 2013. Biopsy of the right breast only revealed fat necrosis at the site of her previous MammoSite. 3. Genetic testing: Patient has a sister and mother both with breast cancer. 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 4. Anxiety/insomnia: Patient does not complain of this today.  Approximately 20  minutes was spent in discussion of which greater than 50% was consultation.  Patient expressed understanding and was in agreement with this plan. She also understands that She can call clinic at any time with any questions, concerns, or complaints.   Cancer Staging Carcinoma of upper-outer quadrant of left breast in female, estrogen receptor negative ([place]) Staging form: Breast, AJCC 8th Edition - Clinical stage from 04/08/2017: Stage IB (cT1c, cN0, cM0, G3, ER: Negative, PR: Negative, HER2: Negative) - Signed by Lloyd Huger, MD on 04/08/2017 - Pathologic stage from 05/11/2017: Stage IB (pT1c, pN0, cM0, G3, ER: Negative, PR: Negative, HER2: Negative) - Signed by Lloyd Huger, MD on 05/11/2017   Lloyd Huger, MD   05/16/2018 9:40 PM

## 2018-05-12 ENCOUNTER — Inpatient Hospital Stay: Payer: Medicare HMO | Attending: Oncology | Admitting: Oncology

## 2018-05-12 ENCOUNTER — Inpatient Hospital Stay: Payer: Medicare HMO

## 2018-05-12 ENCOUNTER — Other Ambulatory Visit: Payer: Self-pay

## 2018-05-12 VITALS — BP 139/74 | HR 82 | Temp 98.6°F | Resp 18 | Wt 187.0 lb

## 2018-05-12 DIAGNOSIS — F419 Anxiety disorder, unspecified: Secondary | ICD-10-CM

## 2018-05-12 DIAGNOSIS — C50412 Malignant neoplasm of upper-outer quadrant of left female breast: Secondary | ICD-10-CM | POA: Diagnosis not present

## 2018-05-12 DIAGNOSIS — Z87891 Personal history of nicotine dependence: Secondary | ICD-10-CM

## 2018-05-12 DIAGNOSIS — K219 Gastro-esophageal reflux disease without esophagitis: Secondary | ICD-10-CM

## 2018-05-12 DIAGNOSIS — Z79899 Other long term (current) drug therapy: Secondary | ICD-10-CM | POA: Diagnosis not present

## 2018-05-12 DIAGNOSIS — G4700 Insomnia, unspecified: Secondary | ICD-10-CM

## 2018-05-12 DIAGNOSIS — Z9221 Personal history of antineoplastic chemotherapy: Secondary | ICD-10-CM

## 2018-05-12 DIAGNOSIS — Z801 Family history of malignant neoplasm of trachea, bronchus and lung: Secondary | ICD-10-CM

## 2018-05-12 DIAGNOSIS — M48061 Spinal stenosis, lumbar region without neurogenic claudication: Secondary | ICD-10-CM

## 2018-05-12 DIAGNOSIS — Z171 Estrogen receptor negative status [ER-]: Secondary | ICD-10-CM | POA: Diagnosis not present

## 2018-05-12 DIAGNOSIS — Z803 Family history of malignant neoplasm of breast: Secondary | ICD-10-CM

## 2018-05-12 DIAGNOSIS — Z923 Personal history of irradiation: Secondary | ICD-10-CM

## 2018-05-12 NOTE — Progress Notes (Signed)
Here for follow up. Overall "doing well " she stated. Related recent fall and bruised rib-per pt

## 2018-05-12 NOTE — Progress Notes (Signed)
Survivorship Care Plan visit completed.  Treatment summary reviewed and given to patient.  ASCO answers booklet reviewed and given to patient.  CARE program and Cancer Transitions discussed with patient along with other resources cancer center offers to patients and caregivers.  Patient verbalized understanding.    

## 2018-05-19 ENCOUNTER — Encounter: Payer: Self-pay | Admitting: Radiation Oncology

## 2018-05-19 ENCOUNTER — Other Ambulatory Visit: Payer: Self-pay

## 2018-05-19 ENCOUNTER — Ambulatory Visit
Admission: RE | Admit: 2018-05-19 | Discharge: 2018-05-19 | Disposition: A | Discharge status: Home / Self Care | Payer: Medicare HMO | Source: Ambulatory Visit | Attending: Radiation Oncology | Admitting: Radiation Oncology

## 2018-05-19 VITALS — BP 136/77 | HR 99 | Temp 98.4°F | Resp 20 | Wt 185.2 lb

## 2018-05-19 DIAGNOSIS — Z853 Personal history of malignant neoplasm of breast: Secondary | ICD-10-CM | POA: Insufficient documentation

## 2018-05-19 DIAGNOSIS — Z923 Personal history of irradiation: Secondary | ICD-10-CM | POA: Insufficient documentation

## 2018-05-19 DIAGNOSIS — C50412 Malignant neoplasm of upper-outer quadrant of left female breast: Secondary | ICD-10-CM

## 2018-05-19 DIAGNOSIS — Z171 Estrogen receptor negative status [ER-]: Secondary | ICD-10-CM

## 2018-05-19 NOTE — Progress Notes (Signed)
Radiation Oncology Follow up Note  Name: Megan Santana   Date:   05/19/2018 MRN:  [id number] DOB: Jan 31, 1934    This 84 y.o. female presents to the clinic today for 1 year follow-up status post accelerated partial breast relation to her left breast for stage I ER/PR negative invasive mammary carcinoma.  REFERRING PROVIDER: Lynnell Jude, MD  HPI: patient is an 84 year old female now seen out 1 year having completed accelerated partial breast radiation to her left breast for stage I ER/PR negative invasive mammary carcinoma seen today in routine follow-up for major complaint is fatigue. She specifically denies breast tenderness cough or bone pain..her last mammogram which I have reviewed was BI-RADS 2 benign last month. She is not on antiestrogen therapy based on the ER/PR negative nature of her disease.  COMPLICATIONS OF TREATMENT: none  FOLLOW UP COMPLIANCE: keeps appointments   PHYSICAL EXAM:  BP 136/77   Pulse 99   Temp 98.4 F (36.9 C)   Resp 20   Wt 185 lb 3 oz (84 kg)   BMI 33.87 kg/m  Lungs are clear to A&P cardiac examination essentially unremarkable with regular rate and rhythm. No dominant mass or nodularity is noted in either breast in 2 positions examined. Incision is well-healed. No axillary or supraclavicular adenopathy is appreciated. Cosmetic result is excellent.there is a slight cystic area in the left breast at the 1:00 position approximately 3 cm from the nipple that bears following. Well-developed well-nourished patient in NAD. HEENT reveals PERLA, EOMI, discs not visualized.  Oral cavity is clear. No oral mucosal lesions are identified. Neck is clear without evidence of cervical or supraclavicular adenopathy. Lungs are clear to A&P. Cardiac examination is essentially unremarkable with regular rate and rhythm without murmur rub or thrill. Abdomen is benign with no organomegaly or masses noted. Motor sensory and DTR levels are equal and symmetric in the upper and lower  extremities. Cranial nerves II through XII are grossly intact. Proprioception is intact. No peripheral adenopathy or edema is identified. No motor or sensory levels are noted. Crude visual fields are within normal range.  RADIOLOGY RESULTS: mammograms reviewed and compatible with the above-stated findings  PLAN: present time patient continues to do well with no evidence of disease. I've asked to see her back in 1 year for follow-up. Patient is to call anytime with any concerns. Follow-up mammograms will be ordered.  I would like to take this opportunity to thank you for allowing me to participate in the care of your patient.Noreene Filbert, MD

## 2018-11-14 NOTE — Progress Notes (Signed)
[name]  Telephone:(336) [phone] Fax:(336) [phone]  ID: Megan Santana OB: 03-Apr-1934  MR#: [id number]  UQJ#:[id number]  Patient Care Team: Lynnell Jude, MD as PCP - General (Family Medicine) Rico Junker, RN as Oncology Nurse Navigator Grayland Ormond, Kathlene November, MD as Consulting Physician (Oncology) Clemmie Krill Lynnell Jude, MD as Referring Physician (Family Medicine) Bary Castilla, Forest Gleason, MD (General Surgery) Noreene Filbert, MD as Referring Physician (Radiation Oncology)  CHIEF COMPLAINT: Pathologic stage IB triple negative invasive carcinoma of the upper outer quadrant of the left breast.  INTERVAL HISTORY: Patient returns to clinic today for routine 67-monthevaluation.  She currently feels well and is asymptomatic. She has no neurologic complaints. She denies any recent fevers or illnesses. She has a good appetite and denies weight loss. She has no chest pain or shortness of breath. She denies any nausea, vomiting, constipation, or diarrhea. She has no urinary complaints.  Patient feels at her baseline offers no specific complaints today.  REVIEW OF SYSTEMS:   Review of Systems  Constitutional: Negative.  Negative for fever, malaise/fatigue and weight loss.  Respiratory: Negative.  Negative for cough and shortness of breath.   Cardiovascular: Negative.  Negative for chest pain and leg swelling.  Gastrointestinal: Negative.  Negative for abdominal pain and constipation.  Genitourinary: Negative.  Negative for dysuria.  Musculoskeletal: Negative.  Negative for back pain.  Skin: Negative.  Negative for rash.  Neurological: Negative.  Negative for sensory change, focal weakness, weakness and headaches.  Psychiatric/Behavioral: Negative.  The patient is not nervous/anxious and does not have insomnia.     As per HPI. Otherwise, a complete review of systems is negative.  PAST MEDICAL HISTORY: Past Medical History:  Diagnosis Date  . Arthritis   . Breast cancer ([facility]  2007   right lumpectomy  . Breast cancer ([place] 03/31/2017   16 mm invasive mammary carcinoma, left upper outer quadrant, T1c, N0, triple negative, histologic grade 3.  . Family history of adverse reaction to anesthesia    mom n/v  . GERD (gastroesophageal reflux disease)   . Heart murmur   . Lumbar radiculitis   . Lumbar stenosis   . Personal history of chemotherapy 2018   Left  . Personal history of radiation therapy 2007   Right  . Personal history of radiation therapy 2018   Left    PAST SURGICAL HISTORY: Past Surgical History:  Procedure Laterality Date  . ABDOMINAL HYSTERECTOMY     total  . BACK SURGERY     lumbar  . BREAST BIOPSY Bilateral 03/31/2017   bilat u/s core INVASIVE MAMMARY CARCINOMA  . BREAST BIOPSY Right 03/31/2017   DENSE FIBROSIS AND SHEETS OF MACROPHAGES  . BREAST EXCISIONAL BIOPSY Left 2018  . BREAST LUMPECTOMY Right 2007  . BREAST LUMPECTOMY Left 04/23/2017  . BREAST LUMPECTOMY Left 04/23/2017   Procedure: BREAST LUMPECTOMY WITH EXCISION OF SENTINEL NODE;  Surgeon: BRobert Bellow MD;  Whole breast radiation ORS;  Service: General;  Laterality: Left;  . BREAST SURGERY Right    Wide excision  . EYE SURGERY     cataracts bil  . FRACTURE SURGERY Left 2015 or 2016  . TUMOR EXCISION  2001    FAMILY HISTORY: Family History  Problem Relation Age of Onset  . Breast cancer Sister 556 . Lung cancer Mother 67      mets to breast  . Lung cancer Father 488 . Heart attack Brother   . Breast cancer Maternal Aunt  age unknown  . Stroke Sister 23    ADVANCED DIRECTIVES (Y/N):  N  HEALTH MAINTENANCE: Social History   Tobacco Use  . Smoking status: Former Smoker    Packs/day: 0.25    Years: 10.00    Pack years: 2.50    Types: Cigarettes    Last attempt to quit: 1970    Years since quitting: 49.9  . Smokeless tobacco: Never Used  Substance Use Topics  . Alcohol use: Yes    Comment: occasionally  . Drug use: No      Colonoscopy:  PAP:  Bone density:  Lipid panel:  Allergies  Allergen Reactions  . Oxycodone Other (See Comments)    Can't sleep, feels bad when taking.    Current Outpatient Medications  Medication Sig Dispense Refill  . acetaminophen (TYLENOL) 500 MG tablet Take 1,000 mg by mouth every 6 (six) hours as needed (for pain.).    Marland Kitchen Calcium Carb-Cholecalciferol (CALCIUM 600+D) 600-800 MG-UNIT TABS Take 1 tablet by mouth every morning.     . calcium carbonate (TUMS EX) 750 MG chewable tablet Chew 1-2 tablets by mouth 3 (three) times daily as needed (for heartburn/indigestion).    . Cholecalciferol (VITAMIN D-3) 5000 units TABS Take 5,000 Units by mouth daily.    . magnesium oxide (MAG-OX) 400 MG tablet Take 250 mg by mouth daily.     . Multiple Vitamin (MULTIVITAMIN WITH MINERALS) TABS tablet Take 1 tablet by mouth daily. Senior Multivitamin    . Potassium 99 MG TABS Take 99 mg by mouth daily.     . vitamin C (ASCORBIC ACID) 500 MG tablet Take 500 mg by mouth daily.    Marland Kitchen ALPRAZolam (XANAX) 0.25 MG tablet Take 1 tablet (0.25 mg total) by mouth 2 (two) times daily as needed for anxiety. (Patient not taking: Reported on 05/12/2018) 30 tablet 0   No current facility-administered medications for this visit.     OBJECTIVE: Vitals:   11/16/18 1437  BP: (!) 148/83  Pulse: 70  Resp: 20  Temp: 98.2 F (36.8 C)     Body mass index is 32.21 kg/m.    ECOG FS:0 - Asymptomatic  General: Well-developed, well-nourished, no acute distress. Eyes: Pink conjunctiva, anicteric sclera. HEENT: Normocephalic, moist mucous membranes. Breast: Patient declined breast exam today. Lungs: Clear to auscultation bilaterally. Heart: Regular rate and rhythm. No rubs, murmurs, or gallops. Abdomen: Soft, nontender, nondistended. No organomegaly noted, normoactive bowel sounds. Musculoskeletal: No edema, cyanosis, or clubbing. Neuro: Alert, answering all questions appropriately. Cranial nerves grossly  intact. Skin: No rashes or petechiae noted. Psych: Normal affect.  LAB RESULTS:  Lab Results  Component Value Date   NA 138 11/12/2017   K 4.1 11/12/2017   CL 106 11/12/2017   CO2 22 11/12/2017   GLUCOSE 169 (H) 11/12/2017   BUN 18 11/12/2017   CREATININE 0.86 11/12/2017   CALCIUM 9.3 11/12/2017   PROT 7.2 11/12/2017   ALBUMIN 4.4 11/12/2017   AST 29 11/12/2017   ALT 22 11/12/2017   ALKPHOS 69 11/12/2017   BILITOT 0.5 11/12/2017   GFRNONAA >60 11/12/2017   GFRAA >60 11/12/2017    Lab Results  Component Value Date   WBC 9.8 11/12/2017   NEUTROABS 4.8 11/12/2017   HGB 13.1 11/12/2017   HCT 39.1 11/12/2017   MCV 85.8 11/12/2017   PLT 179 11/12/2017     STUDIES: No results found.  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  PLAN:    1. 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 This was likely a second primary.  Patient completed 4 cycles of Taxotere and Cytoxan August 06, 2017.  Also completed adjuvant XRT.  An aromatase inhibitor would not be of any benefit given the ER/PR status of her tumor.  Patient's most recent mammogram on March 23, 2018 was reported as BI-RADS 2.  Repeat in April 2020.  Patient has requested less frequent follow-up, therefore she will return to clinic in 1 year for routine evaluation. She has evaluation by surgery in April 2020, therefore we can alternate visits every 6 months minimizing patient's appointments. 2. 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 Oncotype score 17: Originally diagnosed in 2008. Patient completed 5 years of Arimidex in approximately August 2013. Biopsy of the right breast only revealed fat necrosis at the site of her previous MammoSite. 3. Genetic testing:  Patient has a sister and mother both with breast cancer. 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 4. Anxiety/insomnia: Patient does not complain of this today.  I spent a total of 20 minutes face-to-face with the patient of which greater than 50% of the visit was spent in counseling and coordination of care as detailed above.   Patient expressed understanding and was in agreement with this plan. She also understands that She can call clinic at any time with any questions, concerns, or complaints.   Cancer Staging Carcinoma of upper-outer quadrant of left breast in female, estrogen receptor negative ([place]) Staging form: Breast, AJCC 8th Edition - Clinical stage from 04/08/2017: Stage IB (cT1c, cN0, cM0, G3, ER: Negative, PR: Negative, HER2: Negative) - Signed by Lloyd Huger, MD on 04/08/2017 - Pathologic stage from 05/11/2017: Stage IB (pT1c, pN0, cM0, G3, ER: Negative, PR: Negative, HER2: Negative) - Signed by Lloyd Huger, MD on 05/11/2017   Lloyd Huger, MD   11/19/2018 10:24 AM

## 2018-11-16 ENCOUNTER — Inpatient Hospital Stay: Payer: Medicare HMO | Attending: Oncology | Admitting: Oncology

## 2018-11-16 ENCOUNTER — Other Ambulatory Visit: Payer: Self-pay

## 2018-11-16 ENCOUNTER — Encounter: Payer: Self-pay | Admitting: Oncology

## 2018-11-16 VITALS — BP 148/83 | HR 70 | Temp 98.2°F | Resp 20 | Wt 176.1 lb

## 2018-11-16 DIAGNOSIS — M1990 Unspecified osteoarthritis, unspecified site: Secondary | ICD-10-CM | POA: Insufficient documentation

## 2018-11-16 DIAGNOSIS — Z171 Estrogen receptor negative status [ER-]: Secondary | ICD-10-CM

## 2018-11-16 DIAGNOSIS — Z170 Estrogen receptor positive status [ER+]: Secondary | ICD-10-CM | POA: Diagnosis not present

## 2018-11-16 DIAGNOSIS — Z79899 Other long term (current) drug therapy: Secondary | ICD-10-CM | POA: Diagnosis not present

## 2018-11-16 DIAGNOSIS — Z853 Personal history of malignant neoplasm of breast: Secondary | ICD-10-CM | POA: Diagnosis not present

## 2018-11-16 DIAGNOSIS — Z803 Family history of malignant neoplasm of breast: Secondary | ICD-10-CM | POA: Insufficient documentation

## 2018-11-16 DIAGNOSIS — Z923 Personal history of irradiation: Secondary | ICD-10-CM | POA: Diagnosis not present

## 2018-11-16 DIAGNOSIS — Z9221 Personal history of antineoplastic chemotherapy: Secondary | ICD-10-CM

## 2018-11-16 DIAGNOSIS — C50412 Malignant neoplasm of upper-outer quadrant of left female breast: Secondary | ICD-10-CM | POA: Diagnosis present

## 2018-11-16 DIAGNOSIS — Z87891 Personal history of nicotine dependence: Secondary | ICD-10-CM | POA: Diagnosis not present

## 2018-11-16 DIAGNOSIS — Z9223 Personal history of estrogen therapy: Secondary | ICD-10-CM | POA: Insufficient documentation

## 2018-11-16 DIAGNOSIS — K219 Gastro-esophageal reflux disease without esophagitis: Secondary | ICD-10-CM | POA: Diagnosis not present

## 2018-11-16 NOTE — Progress Notes (Signed)
Patient here today for follow up regarding breast cancer, denies any concerns today. Patient requests to not have breast exam today.

## 2018-11-25 ENCOUNTER — Encounter: Payer: Self-pay | Admitting: Emergency Medicine

## 2018-11-25 ENCOUNTER — Ambulatory Visit
Admission: EM | Admit: 2018-11-25 | Discharge: 2018-11-25 | Disposition: A | Discharge status: Home / Self Care | Payer: Medicare HMO | Attending: Family Medicine | Admitting: Family Medicine

## 2018-11-25 ENCOUNTER — Ambulatory Visit (INDEPENDENT_AMBULATORY_CARE_PROVIDER_SITE_OTHER): Payer: Medicare HMO

## 2018-11-25 ENCOUNTER — Other Ambulatory Visit: Payer: Self-pay

## 2018-11-25 DIAGNOSIS — S91209A Unspecified open wound of unspecified toe(s) with damage to nail, initial encounter: Secondary | ICD-10-CM

## 2018-11-25 DIAGNOSIS — M85871 Other specified disorders of bone density and structure, right ankle and foot: Secondary | ICD-10-CM

## 2018-11-25 DIAGNOSIS — S91201A Unspecified open wound of right great toe with damage to nail, initial encounter: Secondary | ICD-10-CM | POA: Diagnosis not present

## 2018-11-25 NOTE — ED Triage Notes (Signed)
Pt c/o right big toe pain. She reports that the toe nails has been 'black" for a while but today she stumped it and the nail is lifted backwards and bleeding. Accident occurred around noon today. She has been soaking it in epsom salt.

## 2018-11-25 NOTE — Discharge Instructions (Addendum)
Elevate your foot above the level of your heart sufficiently to control pain and swelling for the next 2 days.  Any signs of infection such as drainage ,Increase pain ,streaking up your leg occur return to our clinic immediately or go to the emergency room.  Nail should regrow in 4 to 5 months.

## 2018-11-25 NOTE — ED Provider Notes (Signed)
[facility]    CSN: [id number] Arrival date & time: 11/25/18  1436     History   Chief Complaint Chief Complaint  Patient presents with  . Toe Pain    right big toe    HPI Megan Santana is a 84 y.o. female.   HPI Female presents with an partial avulsion of the right big toe nail.  Reports that the toenails have been black for a while realizing that the toenail will eventually fall off.  Today he avulsed the nail by catching it on a ladder rung bending it backwards.  Has been bleeding but is now under control.  Happened approximately 3 hours ago.  At home she has been soaking in Epson salts.      Past Medical History:  Diagnosis Date  . Arthritis   . Breast cancer [facility]) 2007   right lumpectomy  . Breast cancer ([facility]) 03/31/2017   16 mm invasive mammary carcinoma, left upper outer quadrant, T1c, N0, triple negative, histologic grade 3.  . Family history of adverse reaction to anesthesia    mom n/v  . GERD (gastroesophageal reflux disease)   . Heart murmur   . Lumbar radiculitis   . Lumbar stenosis   . Personal history of chemotherapy 2018   Left  . Personal history of radiation therapy 2007   Right  . Personal history of radiation therapy 2018   Left    Patient Active Problem List   Diagnosis Date Noted  . Goals of care, counseling/discussion 05/11/2017  . Carcinoma of upper-outer quadrant of left breast in female, estrogen receptor negative ([place]) 04/08/2017    Past Surgical History:  Procedure Laterality Date  . ABDOMINAL HYSTERECTOMY     total  . BACK SURGERY     lumbar  . BREAST BIOPSY Bilateral 03/31/2017   bilat u/s core INVASIVE MAMMARY CARCINOMA  . BREAST BIOPSY Right 03/31/2017   DENSE FIBROSIS AND SHEETS OF MACROPHAGES  . BREAST EXCISIONAL BIOPSY Left 2018  . BREAST LUMPECTOMY Right 2007  . BREAST LUMPECTOMY Left 04/23/2017  . BREAST LUMPECTOMY Left 04/23/2017   Procedure: BREAST LUMPECTOMY WITH EXCISION OF SENTINEL NODE;   Surgeon: Robert Bellow, MD;  Whole breast radiation ORS;  Service: General;  Laterality: Left;  . BREAST SURGERY Right    Wide excision  . EYE SURGERY     cataracts bil  . FRACTURE SURGERY Left 2015 or 2016  . TUMOR EXCISION  2001    OB History    Gravida  3   Para  3   Term      Preterm      AB      Living        SAB      TAB      Ectopic      Multiple      Live Births           Obstetric Comments  1st Menstrual Cycle:  18  1st Pregnancy:  21          Home Medications    Prior to Admission medications   Medication Sig Start Date End Date Taking? Authorizing Provider  acetaminophen (TYLENOL) 500 MG tablet Take 1,000 mg by mouth every 6 (six) hours as needed (for pain.).   Yes [provider]  Calcium Carb-Cholecalciferol (CALCIUM 600+D) 600-800 MG-UNIT TABS Take 1 tablet by mouth every morning.    Yes [provider]  calcium carbonate (TUMS EX) 750 MG chewable tablet  Chew 1-2 tablets by mouth 3 (three) times daily as needed (for heartburn/indigestion).   Yes [provider]  Cholecalciferol (VITAMIN D-3) 5000 units TABS Take 5,000 Units by mouth daily.   Yes [provider]  magnesium oxide (MAG-OX) 400 MG tablet Take 250 mg by mouth daily.    Yes [provider]  Multiple Vitamin (MULTIVITAMIN WITH MINERALS) TABS tablet Take 1 tablet by mouth daily. Senior Multivitamin   Yes [provider]  Potassium 99 MG TABS Take 99 mg by mouth daily.    Yes [provider]  vitamin C (ASCORBIC ACID) 500 MG tablet Take 500 mg by mouth daily.   Yes [provider]    Family History Family History  Problem Relation Age of Onset  . Breast cancer Sister 35  . Lung cancer Mother 23       mets to breast  . Lung cancer Father 28  . Heart attack Brother   . Breast cancer Maternal Aunt        age unknown  . Stroke Sister 53    Social History Social History   Tobacco Use  . Smoking  status: Former Smoker    Packs/day: 0.25    Years: 10.00    Pack years: 2.50    Types: Cigarettes    Last attempt to quit: 1970    Years since quitting: 49.9  . Smokeless tobacco: Never Used  Substance Use Topics  . Alcohol use: Yes    Comment: occasionally  . Drug use: No     Allergies   Oxycodone   Review of Systems Review of Systems  Constitutional: Positive for activity change. Negative for appetite change, chills, fatigue and fever.  Musculoskeletal: Positive for gait problem.  Skin: Positive for wound.  All other systems reviewed and are negative.    Physical Exam Triage Vital Signs ED Triage Vitals  Enc Vitals Group     BP 11/25/18 1455 (!) 175/90     Pulse Rate 11/25/18 1455 89     Resp 11/25/18 1455 18     Temp 11/25/18 1455 98 F (36.7 C)     Temp Source 11/25/18 1455 Oral     SpO2 11/25/18 1455 100 %     Weight 11/25/18 1451 185 lb (83.9 kg)     Height 11/25/18 1451 5\' 2"  (1.575 m)     Head Circumference --      Peak Flow --      Pain Score 11/25/18 1449 1     Pain Loc --      Pain Edu? --      Excl. in [place]? --    No data found.  Updated Vital Signs BP (!) 155/78 (BP Location: Left Arm)   Pulse 89   Temp 98 F (36.7 C) (Oral)   Resp 18   Ht 5\' 2"  (1.575 m)   Wt 185 lb (83.9 kg)   SpO2 100%   BMI 33.84 kg/m   Visual Acuity Right Eye Distance:   Left Eye Distance:   Bilateral Distance:    Right Eye Near:   Left Eye Near:    Bilateral Near:     Physical Exam Vitals signs and nursing note reviewed.  Constitutional:      General: She is not in acute distress.    Appearance: Normal appearance. She is not ill-appearing, toxic-appearing or diaphoretic.  HENT:     Head: Normocephalic.     Nose: Nose normal.     Mouth/Throat:  Mouth: Mucous membranes are moist.  Eyes:     Pupils: Pupils are equal, round, and reactive to light.  Musculoskeletal: Normal range of motion.     Comments: M of the right great toe shows the right great  toenail to be at a 90 degree angle to the nailbed connected to the matrix.  Nail bed does not show any lacerations.  Vascular function is intact distally.  Tensor tendon is strong to resistance of dorsiflexion.  Skin:    General: Skin is warm and dry.  Neurological:     General: No focal deficit present.     Mental Status: She is alert and oriented to person, place, and time.  Psychiatric:        Mood and Affect: Mood normal.        Behavior: Behavior normal.        Thought Content: Thought content normal.        Judgment: Judgment normal.            UC Treatments / Results  Labs (all labs ordered are listed, but only abnormal results are displayed) Labs Reviewed - No data to display  EKG None  Radiology Dg Toe Great Right  Result Date: 11/25/2018 CLINICAL DATA:  Avulsed Nail EXAM: RIGHT GREAT TOE COMPARISON:  None. FINDINGS: Generalized osteopenia. No acute fracture or dislocation. Mild osteoarthritis of the first MTP joint and first IP joint. IMPRESSION: No acute osseous injury of the right foot. Electronically Signed   By: Kathreen Devoid   On: 11/25/2018 15:40    Procedures  Explaining the procedure to the patient and answering all questions the area of the great toe was prepped widely with Betadine.  The digital nerves were blocked with 1% lidocaine providing adequate anesthesia.  Attention was then directed to the nail which was grasped with a needle holder and gently removed from its attachment proximally.  It was then trimmed of extra tissue then cleaned thoroughly in solution of dilute Betadine and saline.  Dermabond adhesive was then placed on the nail and the nail was gently guided into the eponychial's and held in place for 3 minutes for the glue to dry.  Sterile dressing was then applied.  Tolerated the procedure well.  Medications Ordered in UC Medications - No data to display  Initial Impression / Assessment and Plan / UC Course  I have reviewed the triage  vital signs and the nursing notes.  Pertinent labs & imaging results that were available during my care of the patient were reviewed by me and considered in my medical decision making (see chart for details).   Keep dry for 24 hours.  The nail becomes dislodged may keep  the nail as bio dressing with gauze wrap on until the nail begins to grow out.  Full regrowth of the nail should take anywhere from 4 to 5 months.  For any further problems recommend following up with a podiatrist.   Final Clinical Impressions(s) / UC Diagnoses   Final diagnoses:  Traumatic avulsion of nail plate of toe, initial encounter     Discharge Instructions     Elevate your foot above the level of your heart sufficiently to control pain and swelling for the next 2 days.  Any signs of infection such as drainage ,Increase pain ,streaking up your leg occur return to our clinic immediately or go to the emergency room.  Nail should regrow in 4 to 5 months.    ED Prescriptions    None  Controlled Substance Prescriptions [place] Controlled Substance Registry consulted? Not Applicable   Lorin Picket, PA-C 11/25/18 2133

## 2019-02-15 ENCOUNTER — Other Ambulatory Visit: Payer: Self-pay

## 2019-02-15 DIAGNOSIS — C50412 Malignant neoplasm of upper-outer quadrant of left female breast: Secondary | ICD-10-CM

## 2019-02-15 DIAGNOSIS — Z171 Estrogen receptor negative status [ER-]: Principal | ICD-10-CM

## 2019-03-30 ENCOUNTER — Other Ambulatory Visit: Payer: Self-pay

## 2019-03-30 ENCOUNTER — Ambulatory Visit
Admission: RE | Admit: 2019-03-30 | Discharge: 2019-03-30 | Disposition: A | Discharge status: Home / Self Care | Payer: Medicare HMO | Source: Ambulatory Visit | Attending: General Surgery | Admitting: General Surgery

## 2019-03-30 DIAGNOSIS — C50412 Malignant neoplasm of upper-outer quadrant of left female breast: Secondary | ICD-10-CM

## 2019-03-30 DIAGNOSIS — Z171 Estrogen receptor negative status [ER-]: Secondary | ICD-10-CM | POA: Diagnosis present

## 2019-03-31 ENCOUNTER — Ambulatory Visit: Payer: Medicare HMO | Admitting: General Surgery

## 2019-04-26 ENCOUNTER — Other Ambulatory Visit: Payer: Self-pay

## 2019-04-26 ENCOUNTER — Encounter: Payer: Self-pay | Admitting: General Surgery

## 2019-04-26 ENCOUNTER — Ambulatory Visit (INDEPENDENT_AMBULATORY_CARE_PROVIDER_SITE_OTHER): Payer: Medicare HMO | Admitting: General Surgery

## 2019-04-26 VITALS — BP 153/80 | HR 78 | Temp 97.5°F | Resp 18 | Ht 62.0 in | Wt 189.0 lb

## 2019-04-26 DIAGNOSIS — C50412 Malignant neoplasm of upper-outer quadrant of left female breast: Secondary | ICD-10-CM | POA: Diagnosis not present

## 2019-04-26 DIAGNOSIS — Z171 Estrogen receptor negative status [ER-]: Secondary | ICD-10-CM | POA: Diagnosis not present

## 2019-04-26 NOTE — Progress Notes (Signed)
Patient ID: Megan Santana, female   DOB: 1934/03/28, 85 y.o.   MRN: [id number]  Chief Complaint  Patient presents with  . Follow-up    HPI Megan Santana is a 85 y.o. female who presents for a breast evaluation. The most recent mammogram was done on  03/24/2019.Marland Kitchen  Patient does perform regular self breast checks and gets regular mammograms done.Patient states she is doing well.     HPI  Past Medical History:  Diagnosis Date  . Arthritis   . Breast cancer [facility]) 2007   right lumpectomy  . Breast cancer ([place]) 03/31/2017   16 mm invasive mammary carcinoma, left upper outer quadrant, T1c, N0, triple negative, histologic grade 3.  . Family history of adverse reaction to anesthesia    mom n/v  . GERD (gastroesophageal reflux disease)   . Heart murmur   . Lumbar radiculitis   . Lumbar stenosis   . Personal history of chemotherapy 2018   Left  . Personal history of radiation therapy 2007   Right  . Personal history of radiation therapy 2018   Left    Past Surgical History:  Procedure Laterality Date  . ABDOMINAL HYSTERECTOMY     total  . BACK SURGERY     lumbar  . BREAST BIOPSY Bilateral 03/31/2017   bilat u/s core INVASIVE MAMMARY CARCINOMA  . BREAST BIOPSY Right 03/31/2017   DENSE FIBROSIS AND SHEETS OF MACROPHAGES  . BREAST EXCISIONAL BIOPSY Left 2018  . BREAST LUMPECTOMY Right 2007  . BREAST LUMPECTOMY Left 04/23/2017  . BREAST LUMPECTOMY Left 04/23/2017   Procedure: BREAST LUMPECTOMY WITH EXCISION OF SENTINEL NODE;  Surgeon: Robert Bellow, MD;  Whole breast radiation ORS;  Service: General;  Laterality: Left;  . BREAST SURGERY Right    Wide excision  . EYE SURGERY     cataracts bil  . FRACTURE SURGERY Left 2015 or 2016  . TUMOR EXCISION  2001    Family History  Problem Relation Age of Onset  . Breast cancer Sister 86  . Lung cancer Mother 110       mets to breast  . Lung cancer Father 42  . Heart attack Brother   . Breast cancer Maternal Aunt        age  unknown  . Stroke Sister 42    Social History Social History   Tobacco Use  . Smoking status: Former Smoker    Packs/day: 0.25    Years: 10.00    Pack years: 2.50    Types: Cigarettes    Last attempt to quit: 1970    Years since quitting: 50.3  . Smokeless tobacco: Never Used  Substance Use Topics  . Alcohol use: Yes    Comment: occasionally  . Drug use: No    Allergies  Allergen Reactions  . Oxycodone Other (See Comments)    Can't sleep, feels bad when taking.    Current Outpatient Medications  Medication Sig Dispense Refill  . acetaminophen (TYLENOL) 500 MG tablet Take 1,000 mg by mouth every 6 (six) hours as needed (for pain.).    Marland Kitchen Calcium Carb-Cholecalciferol (CALCIUM 600+D) 600-800 MG-UNIT TABS Take 1 tablet by mouth every morning.     . calcium carbonate (TUMS EX) 750 MG chewable tablet Chew 1-2 tablets by mouth 3 (three) times daily as needed (for heartburn/indigestion).    . Cholecalciferol (VITAMIN D-3) 5000 units TABS Take 5,000 Units by mouth daily.    . magnesium oxide (MAG-OX) 400 MG tablet Take 250 mg  by mouth daily.     . Multiple Vitamin (MULTIVITAMIN WITH MINERALS) TABS tablet Take 1 tablet by mouth daily. Senior Multivitamin    . Potassium 99 MG TABS Take 99 mg by mouth daily.     . vitamin C (ASCORBIC ACID) 500 MG tablet Take 500 mg by mouth daily.     No current facility-administered medications for this visit.     Review of Systems Review of Systems  Constitutional: Negative.   Respiratory: Negative.   Cardiovascular: Negative.     Blood pressure (!) 153/80, pulse 78, temperature (!) 97.5 F (36.4 C), temperature source Skin, resp. rate 18, height 5\' 2"  (1.575 m), weight 189 lb (85.7 kg), SpO2 98 %.  Physical Exam Physical Exam Constitutional:      Appearance: She is well-developed.  Eyes:     General: No scleral icterus.    Conjunctiva/sclera: Conjunctivae normal.  Neck:     Musculoskeletal: Normal range of motion.  Cardiovascular:      Rate and Rhythm: Normal rate and regular rhythm.     Heart sounds: Normal heart sounds.  Pulmonary:     Effort: Pulmonary effort is normal.     Breath sounds: Normal breath sounds.  Chest:     Breasts:        Right: No inverted nipple, mass, nipple discharge, skin change or tenderness.        Left: No inverted nipple, mass, nipple discharge, skin change or tenderness.    Lymphadenopathy:     Cervical: No cervical adenopathy.     Upper Body:     Right upper body: No supraclavicular or axillary adenopathy.     Left upper body: No supraclavicular or axillary adenopathy.  Skin:    General: Skin is warm and dry.  Neurological:     Mental Status: She is alert and oriented to person, place, and time.     Data Reviewed Bilateral diagnostic mammograms of March 24, 2019 were independently reviewed.  Recommendation for 67-month follow-up of microcalcifications in the retroareolar area on the left noted.  BI-RADS-3. Ultrasound suggests findings of fat necrosis.  Seroma cavity post radiation in the left upper outer quadrant continues to decrease in size.    Assessment Stable clinical breast exam, recommendation for early follow-up.  Unusual location for developing fat necrosis in the absence of trauma with the patient previously treated with accelerated partial breast radiation to the upper outer quadrant.  Plan I reviewed with the patient my preference for 6 rather than 68-month follow-up to detect significant changes in the new microcalcifications.  She was interested in a 1 year follow-up, but did agree to the six-month exam.   We will see you back next year left diagnostic mammogram November 2020.   HPI, Physical Exam, Assessment and Plan have been scribed under the direction and in the presence of Hervey Ard, MD.  Gaspar Cola, CMA  I have completed the exam and reviewed the above documentation for accuracy and completeness.  I agree with the above.  Haematologist has  been used and any errors in dictation or transcription are unintentional.  Hervey Ard, M.D., F.A.C.S.  Forest Gleason Jarica Plass 04/27/2019, 2:50 PM

## 2019-04-26 NOTE — Patient Instructions (Addendum)
We will see you back next year left diagnostic mammogram November 2020.

## 2019-04-28 ENCOUNTER — Ambulatory Visit: Payer: Medicare HMO | Admitting: General Surgery

## 2019-05-24 ENCOUNTER — Other Ambulatory Visit: Payer: Self-pay

## 2019-05-25 ENCOUNTER — Other Ambulatory Visit: Payer: Self-pay

## 2019-05-25 ENCOUNTER — Ambulatory Visit
Admission: RE | Admit: 2019-05-25 | Discharge: 2019-05-25 | Disposition: A | Discharge status: Home / Self Care | Payer: Medicare HMO | Source: Ambulatory Visit | Attending: Radiation Oncology | Admitting: Radiation Oncology

## 2019-05-25 ENCOUNTER — Encounter: Payer: Self-pay | Admitting: Radiation Oncology

## 2019-05-25 VITALS — BP 150/98 | HR 83 | Temp 98.3°F | Resp 16 | Wt 188.4 lb

## 2019-05-25 DIAGNOSIS — R922 Inconclusive mammogram: Secondary | ICD-10-CM | POA: Insufficient documentation

## 2019-05-25 DIAGNOSIS — Z853 Personal history of malignant neoplasm of breast: Secondary | ICD-10-CM | POA: Diagnosis not present

## 2019-05-25 DIAGNOSIS — C50412 Malignant neoplasm of upper-outer quadrant of left female breast: Secondary | ICD-10-CM

## 2019-05-25 DIAGNOSIS — Z923 Personal history of irradiation: Secondary | ICD-10-CM | POA: Insufficient documentation

## 2019-05-25 DIAGNOSIS — Z171 Estrogen receptor negative status [ER-]: Secondary | ICD-10-CM

## 2019-05-25 NOTE — Progress Notes (Signed)
Radiation Oncology Follow up Note  Name: Megan Santana   Date:   05/25/2019 MRN:  [id number] DOB: [birth year]    This 85 y.o. female presents to the clinic today for 2-year follow-up status post accelerated partial breast radiation to her left breast for stage I ER PR negative invasive mammary carcinoma.  REFERRING PROVIDER: Lynnell Jude, MD  HPI: Patient is an 85 year old female now about 2 years having considered completed accelerated partial breast irradiation to her left breast for stage I ER PR negative invasive mammary carcinoma.  Seen today in routine follow-up she is doing well.  She specifically denies breast tenderness cough or bone pain.Marland Kitchen  Her most recent mammogram back in April showed increasing density in the anterior left breast thought to represent developing fat necrosis.  This is being followed by Dr. Tollie Pizza.  She is not on antiestrogen therapy based on her ER PR profile.  COMPLICATIONS OF TREATMENT: none  FOLLOW UP COMPLIANCE: keeps appointments   PHYSICAL EXAM:  BP (!) 150/98 (BP Location: Left Arm, Patient Position: Sitting)   Pulse 83   Temp 98.3 F (36.8 C) (Tympanic)   Resp 16   Wt 188 lb 6.1 oz (85.5 kg)   BMI 34.46 kg/m  Lungs are clear to A&P cardiac examination essentially unremarkable with regular rate and rhythm. No dominant mass or nodularity is noted in either breast in 2 positions examined. Incision is well-healed. No axillary or supraclavicular adenopathy is appreciated. Cosmetic result is excellent.  Well-developed well-nourished patient in NAD. HEENT reveals PERLA, EOMI, discs not visualized.  Oral cavity is clear. No oral mucosal lesions are identified. Neck is clear without evidence of cervical or supraclavicular adenopathy. Lungs are clear to A&P. Cardiac examination is essentially unremarkable with regular rate and rhythm without murmur rub or thrill. Abdomen is benign with no organomegaly or masses noted. Motor sensory and DTR levels are equal and  symmetric in the upper and lower extremities. Cranial nerves II through XII are grossly intact. Proprioception is intact. No peripheral adenopathy or edema is identified. No motor or sensory levels are noted. Crude visual fields are within normal range.  RADIOLOGY RESULTS: Mammogram and ultrasound reviewed compatible with above-stated findings  PLAN: I believe this is fat necrosis developing in her breast from probable accelerated partial breast radiation.  I believe she has no evidence of disease.  She continues close follow-up care with Dr. Tollie Pizza.  I have asked to see her back in 1 year for follow-up.  We will be happy to reevaluate the patient anytime.  Patient knows to call with any concerns.  I would like to take this opportunity to thank you for allowing me to participate in the care of your patient.Noreene Filbert, MD

## 2019-07-06 ENCOUNTER — Encounter: Payer: Self-pay | Admitting: General Surgery

## 2019-08-29 ENCOUNTER — Other Ambulatory Visit: Payer: Self-pay

## 2019-08-29 DIAGNOSIS — C50412 Malignant neoplasm of upper-outer quadrant of left female breast: Secondary | ICD-10-CM

## 2019-08-29 DIAGNOSIS — R920 Mammographic microcalcification found on diagnostic imaging of breast: Secondary | ICD-10-CM

## 2019-09-19 ENCOUNTER — Other Ambulatory Visit: Payer: Medicare HMO

## 2019-09-27 ENCOUNTER — Ambulatory Visit
Admission: RE | Admit: 2019-09-27 | Discharge: 2019-09-27 | Disposition: A | Discharge status: Home / Self Care | Payer: Medicare HMO | Attending: Family Medicine | Admitting: Family Medicine

## 2019-09-27 ENCOUNTER — Other Ambulatory Visit: Payer: Self-pay | Admitting: Family Medicine

## 2019-09-27 ENCOUNTER — Other Ambulatory Visit: Payer: Self-pay

## 2019-09-27 ENCOUNTER — Ambulatory Visit
Admission: RE | Admit: 2019-09-27 | Discharge: 2019-09-27 | Disposition: A | Discharge status: Home / Self Care | Payer: Medicare HMO | Source: Ambulatory Visit | Attending: Family Medicine | Admitting: Family Medicine

## 2019-09-27 DIAGNOSIS — R059 Cough, unspecified: Secondary | ICD-10-CM

## 2019-09-27 DIAGNOSIS — R05 Cough: Secondary | ICD-10-CM | POA: Diagnosis present

## 2019-09-27 DIAGNOSIS — D72829 Elevated white blood cell count, unspecified: Secondary | ICD-10-CM

## 2019-09-28 ENCOUNTER — Ambulatory Visit: Payer: Medicare HMO | Admitting: Surgery

## 2019-09-29 ENCOUNTER — Telehealth: Payer: Self-pay | Admitting: *Deleted

## 2019-09-29 NOTE — Telephone Encounter (Signed)
Dr Clemmie Krill needs to speak with Dr Grayland Ormond today or tomorrow about this patient Please return her call [phone] Cell or [phone] office

## 2019-09-30 ENCOUNTER — Other Ambulatory Visit: Payer: Self-pay | Admitting: Oncology

## 2019-09-30 DIAGNOSIS — D72820 Lymphocytosis (symptomatic): Secondary | ICD-10-CM

## 2019-09-30 NOTE — Telephone Encounter (Signed)
Call returned.

## 2019-10-21 NOTE — Progress Notes (Signed)
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  Leukocytosis.  INTERVAL HISTORY: Patient returns to clinic today as an add-on for further evaluation of an elevated white blood cell count with lymphocyte predominance.  She has chronic worsening back pain, but otherwise feels well.  She has no neurologic complaints. She denies any recent fevers or illnesses. She has a good appetite and denies weight loss.  She denies any chest pain, shortness of breath, cough, or hemoptysis.  She denies any nausea, vomiting, constipation, or diarrhea. She has no urinary complaints.  Patient offers no further specific complaints today.  REVIEW OF SYSTEMS:   Review of Systems  Constitutional: Negative.  Negative for fever, malaise/fatigue and weight loss.  Respiratory: Negative.  Negative for cough, hemoptysis and shortness of breath.   Cardiovascular: Negative.  Negative for chest pain and leg swelling.  Gastrointestinal: Negative.  Negative for abdominal pain and constipation.  Genitourinary: Negative.  Negative for dysuria.  Musculoskeletal: Positive for back pain.  Skin: Negative.  Negative for rash.  Neurological: Negative.  Negative for sensory change, focal weakness, weakness and headaches.  Psychiatric/Behavioral: Negative.  The patient is not nervous/anxious and does not have insomnia.     As per HPI. Otherwise, a complete review of systems  is negative.  PAST MEDICAL HISTORY: Past Medical History:  Diagnosis Date   Arthritis    Breast cancer ([place]) 2007   right lumpectomy   Breast cancer ([place]) 03/31/2017   16 mm invasive mammary carcinoma, left upper outer quadrant, T1c, N0, triple negative, histologic grade 3.   Family history of adverse reaction to anesthesia    mom n/v   GERD (gastroesophageal reflux disease)    Heart murmur    Lumbar radiculitis    Lumbar stenosis    Personal history of chemotherapy 2018   Left   Personal history of radiation therapy 2007   Right   Personal history of radiation therapy 2018   Left    PAST SURGICAL HISTORY: Past Surgical History:  Procedure Laterality Date   ABDOMINAL HYSTERECTOMY     total   BACK SURGERY     lumbar   BREAST BIOPSY Bilateral 03/31/2017   bilat u/s core INVASIVE MAMMARY CARCINOMA   BREAST BIOPSY Right 03/31/2017   DENSE FIBROSIS AND SHEETS OF MACROPHAGES   BREAST EXCISIONAL BIOPSY Left 2018   BREAST LUMPECTOMY Right 2007   BREAST LUMPECTOMY Left 04/23/2017   BREAST LUMPECTOMY Left 04/23/2017   Procedure: BREAST LUMPECTOMY WITH EXCISION OF SENTINEL NODE;  Surgeon: Robert Bellow, MD;  Whole breast radiation ORS;  Service: General;  Laterality: Left;   BREAST SURGERY Right    Wide excision   EYE SURGERY     cataracts bil   FRACTURE SURGERY Left 2015 or 2016   TUMOR EXCISION  2001    FAMILY HISTORY: Family History  Problem Relation Age of Onset   Breast cancer Sister 19   Lung cancer Mother 4       mets to breast  Lung cancer Father 54   Heart attack Brother    Breast cancer Maternal Aunt        age unknown   Stroke Sister 63    ADVANCED DIRECTIVES (Y/N):  N  HEALTH MAINTENANCE: Social History   Tobacco Use   Smoking status: Former Smoker    Packs/day: 0.25    Years: 10.00    Pack years: 2.50    Types: Cigarettes    Quit date: 1970    Years since quitting: 50.8   Smokeless tobacco: Never Used    Substance Use Topics   Alcohol use: Yes    Comment: occasionally   Drug use: No     Colonoscopy:  PAP:  Bone density:  Lipid panel:  Allergies  Allergen Reactions   Oxycodone Other (See Comments)    Can't sleep, feels bad when taking.    Current Outpatient Medications  Medication Sig Dispense Refill   acetaminophen (TYLENOL) 500 MG tablet Take 1,000 mg by mouth every 6 (six) hours as needed (for pain.).     Calcium Carb-Cholecalciferol (CALCIUM 600+D) 600-800 MG-UNIT TABS Take 1 tablet by mouth every morning.      calcium carbonate (TUMS EX) 750 MG chewable tablet Chew 1-2 tablets by mouth 3 (three) times daily as needed (for heartburn/indigestion).     Cholecalciferol (VITAMIN D-3) 5000 units TABS Take 5,000 Units by mouth daily.     magnesium oxide (MAG-OX) 400 MG tablet Take 250 mg by mouth daily.      Multiple Vitamin (MULTIVITAMIN WITH MINERALS) TABS tablet Take 1 tablet by mouth daily. Senior Multivitamin     Potassium 99 MG TABS Take 99 mg by mouth daily.      vitamin C (ASCORBIC ACID) 500 MG tablet Take 500 mg by mouth daily.     No current facility-administered medications for this visit.     OBJECTIVE: Vitals:   10/24/19 1403  BP: (!) 147/78  Pulse: 70  Resp: 18  Temp: 98.5 F (36.9 C)  SpO2: 100%     Body mass index is 34.07 kg/m.    ECOG FS:0 - Asymptomatic  General: Well-developed, well-nourished, no acute distress. Eyes: Pink conjunctiva, anicteric sclera. HEENT: Normocephalic, moist mucous membranes. Breast: Exam deferred today. Lungs: Clear to auscultation bilaterally. Heart: Regular rate and rhythm. No rubs, murmurs, or gallops. Abdomen: Soft, nontender, nondistended. No organomegaly noted, normoactive bowel sounds. Musculoskeletal: No edema, cyanosis, or clubbing. Neuro: Alert, answering all questions appropriately. Cranial nerves grossly intact. Skin: No rashes or petechiae noted. Psych: Normal affect.  LAB RESULTS:  Lab Results   Component Value Date   NA 138 11/12/2017   K 4.1 11/12/2017   CL 106 11/12/2017   CO2 22 11/12/2017   GLUCOSE 169 (H) 11/12/2017   BUN 18 11/12/2017   CREATININE 0.86 11/12/2017   CALCIUM 9.3 11/12/2017   PROT 7.2 11/12/2017   ALBUMIN 4.4 11/12/2017   AST 29 11/12/2017   ALT 22 11/12/2017   ALKPHOS 69 11/12/2017   BILITOT 0.5 11/12/2017   GFRNONAA >60 11/12/2017   GFRAA >60 11/12/2017    Lab Results  Component Value Date   WBC 24.1 (H) 10/25/2019   NEUTROABS 6.1 10/25/2019   HGB 12.7 10/25/2019   HCT 37.6 10/25/2019   MCV 89.1 10/25/2019   PLT 202 10/25/2019     STUDIES: Dg Chest 2 View  Result Date: 09/28/2019 CLINICAL DATA:  Cough for 2 weeks.  History of breast carcinoma. EXAM: CHEST - 2 VIEW COMPARISON:  07/02/2006 FINDINGS:  The heart size and mediastinal contours are within normal limits. There is no evidence of pulmonary edema, consolidation, pneumothorax, nodule or pleural fluid. The visualized skeletal structures are unremarkable. IMPRESSION: No active cardiopulmonary disease. Electronically Signed   By: Aletta Edouard M.D.   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  PLAN:    1. 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 This was likely a second primary.  Patient completed 4 cycles of Taxotere and Cytoxan August 06, 2017.  Also completed adjuvant XRT.  An aromatase inhibitor would not be of any benefit given the ER/PR status of her tumor.  Patient's most recent mammogram on March 30, 2019 was reported as BI-RADS 3.  Recommendation was to repeat in 6 months therefore this will be scheduled in the next 1 to 2 weeks.  Return to clinic in 6 months for routine evaluation.  2. Pathologic stage IA (T1 cN0 M0) ER/PR positive, HER-2  negative invasive carcinoma of the right breast. Oncotype score 17: Originally diagnosed in 2008. Patient completed 5 years of Arimidex in approximately August 2013. Biopsy of the right breast only revealed fat necrosis at the site of her previous MammoSite. 3. Genetic testing: Patient has a sister and mother both with breast cancer. 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 4. Anxiety/insomnia: Patient does not complain of this today. 5.  Leukocytosis: Given the lymphocyte predominance, highly suspicious for underlying CLL.  Flow cytometry was ordered for completeness today.  No intervention is needed.  Patient does not require bone marrow biopsy.  Return to clinic in 6 months as above with repeat laboratory work and further evaluation.  I spent a total of 30 minutes face-to-face with the patient of which greater than 50% of the visit was spent in counseling and coordination of care as detailed above.   Patient expressed understanding and was in agreement with this plan. She also understands that She can call clinic at any time with any questions, concerns, or complaints.   Cancer Staging Carcinoma of upper-outer quadrant of left breast in female, estrogen receptor negative ([place]) Staging form: Breast, AJCC 8th Edition - Clinical stage from 04/08/2017: Stage IB (cT1c, cN0, cM0, G3, ER: Negative, PR: Negative, HER2: Negative) - Signed by Lloyd Huger, MD on 04/08/2017 - Pathologic stage from 05/11/2017: Stage IB (pT1c, pN0, cM0, G3, ER: Negative, PR: Negative, HER2: Negative) - Signed by Lloyd Huger, MD on 05/11/2017   Lloyd Huger, MD   10/25/2019 1:32 PM

## 2019-10-24 ENCOUNTER — Other Ambulatory Visit: Payer: Self-pay

## 2019-10-24 NOTE — Progress Notes (Signed)
Patient prescreened for appointment. Patient has no concerns or questions.  

## 2019-10-25 ENCOUNTER — Other Ambulatory Visit: Payer: Self-pay

## 2019-10-25 ENCOUNTER — Encounter: Payer: Self-pay | Admitting: Oncology

## 2019-10-25 ENCOUNTER — Inpatient Hospital Stay: Payer: Medicare HMO | Attending: Oncology

## 2019-10-25 ENCOUNTER — Inpatient Hospital Stay (HOSPITAL_BASED_OUTPATIENT_CLINIC_OR_DEPARTMENT_OTHER): Payer: Medicare HMO | Admitting: Oncology

## 2019-10-25 VITALS — BP 147/78 | HR 70 | Temp 98.5°F | Resp 18 | Wt 186.3 lb

## 2019-10-25 DIAGNOSIS — G4700 Insomnia, unspecified: Secondary | ICD-10-CM | POA: Diagnosis not present

## 2019-10-25 DIAGNOSIS — Z8249 Family history of ischemic heart disease and other diseases of the circulatory system: Secondary | ICD-10-CM | POA: Insufficient documentation

## 2019-10-25 DIAGNOSIS — Z171 Estrogen receptor negative status [ER-]: Secondary | ICD-10-CM

## 2019-10-25 DIAGNOSIS — Z801 Family history of malignant neoplasm of trachea, bronchus and lung: Secondary | ICD-10-CM | POA: Insufficient documentation

## 2019-10-25 DIAGNOSIS — Z853 Personal history of malignant neoplasm of breast: Secondary | ICD-10-CM | POA: Insufficient documentation

## 2019-10-25 DIAGNOSIS — C50412 Malignant neoplasm of upper-outer quadrant of left female breast: Secondary | ICD-10-CM | POA: Diagnosis not present

## 2019-10-25 DIAGNOSIS — F419 Anxiety disorder, unspecified: Secondary | ICD-10-CM | POA: Diagnosis not present

## 2019-10-25 DIAGNOSIS — D72829 Elevated white blood cell count, unspecified: Secondary | ICD-10-CM | POA: Diagnosis not present

## 2019-10-25 DIAGNOSIS — Z79899 Other long term (current) drug therapy: Secondary | ICD-10-CM | POA: Insufficient documentation

## 2019-10-25 DIAGNOSIS — Z90710 Acquired absence of both cervix and uterus: Secondary | ICD-10-CM | POA: Diagnosis not present

## 2019-10-25 DIAGNOSIS — D72820 Lymphocytosis (symptomatic): Secondary | ICD-10-CM

## 2019-10-25 DIAGNOSIS — Z87891 Personal history of nicotine dependence: Secondary | ICD-10-CM | POA: Diagnosis not present

## 2019-10-25 LAB — CBC WITH DIFFERENTIAL/PLATELET
Abs Immature Granulocytes: 0.24 10*3/uL — ABNORMAL HIGH (ref 0.00–0.07)
Basophils Absolute: 0.1 10*3/uL (ref 0.0–0.1)
Basophils Relative: 1 %
Eosinophils Absolute: 0.2 10*3/uL (ref 0.0–0.5)
Eosinophils Relative: 1 %
HCT: 37.6 % (ref 36.0–46.0)
Hemoglobin: 12.7 g/dL (ref 12.0–15.0)
Immature Granulocytes: 1 %
Lymphocytes Relative: 61 %
Lymphs Abs: 14.9 10*3/uL — ABNORMAL HIGH (ref 0.7–4.0)
MCH: 30.1 pg (ref 26.0–34.0)
MCHC: 33.8 g/dL (ref 30.0–36.0)
MCV: 89.1 fL (ref 80.0–100.0)
Monocytes Absolute: 2.6 10*3/uL — ABNORMAL HIGH (ref 0.1–1.0)
Monocytes Relative: 11 %
Neutro Abs: 6.1 10*3/uL (ref 1.7–7.7)
Neutrophils Relative %: 25 %
Platelets: 202 10*3/uL (ref 150–400)
RBC: 4.22 MIL/uL (ref 3.87–5.11)
RDW: 12.9 % (ref 11.5–15.5)
Smear Review: NORMAL
WBC: 24.1 10*3/uL — ABNORMAL HIGH (ref 4.0–10.5)
nRBC: 0 % (ref 0.0–0.2)

## 2019-10-27 LAB — COMP PANEL: LEUKEMIA/LYMPHOMA: Immunophenotypic Profile: 59

## 2019-11-04 ENCOUNTER — Telehealth: Payer: Self-pay | Admitting: *Deleted

## 2019-11-04 NOTE — Telephone Encounter (Signed)
Patient has CLL as suspected and discussed.  No intervention is needed.  Can have a video assisted telemedicine visit in the next 1 to 2 weeks if she wishes to further discuss.  Please add lab only visit with CBC in 3 months and then keep follow-up as scheduled in 6 months with lab and MD.

## 2019-11-04 NOTE — Telephone Encounter (Signed)
Call returned to patient and explained doctor response. She does not have ability to do video visit right now, but her daughter is going to set up her MyChart account this week on her tablet and I told her to call back if she wants to schedule a video visit. She accepts a lab only visit 01/31/19 at 8:30AM

## 2019-11-04 NOTE — Telephone Encounter (Signed)
Patient called asking for results of labs. 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 CLinical correlation is required.   CLINICAL INFO Comment VC   Comment: (NOTE)  Accompanying CBC dated 10-25-19 shows an absolute lymphocytosis. WBC  24.1,  Lym% 61., Lym 14.9   Specimen Type Comment   Comment: Peripheral blood  ASSESSMENT OF LEUKOCYTES Comment   Comment: (NOTE)  A CD5+, CD23+, CD20- monoclonal B cell population is detected with  kappa  light chain restriction representing 59% of the leukocytes. CD38 is  not  expressed on the clonal B-cells.  There is no loss of, or aberrant expression of, the pan T cell  antigens to  suggest a neoplastic T cell process.  CD4:CD8 ratio 3.5  No circulating blasts are detected.  Rare neutrophils show left-shifted maturation.  Analysis of the leukocyte population shows: granulocytes 29%,  monocytes 2%,  lymphocytes 69%, blasts <0.5%, B cells 59%, T cells 7%, NK cells 3%.   % Viable Cells Comment VC   Comment: 94%  Immunophenotypic Profile 59% of total cells (Phenotype below) VC   Comment: Comment  Abnormal cell population: present   ANALYSIS AND GATING STRATEGY Comment   Comment: 8 color analysis with CD45/SSC  IMMUNOPHENOTYPING STUDY Comment   Comment: (NOTE)  CD2    (-)      CD3    (-)  CD4    (-)      CD5    (+)  CD7    (-)      CD8    (-)  CD10   (-)      CD11b   (-)  CD11c    (+)      CD13   (-)  CD14   (-)      CD15   (-)  CD16   (-)      CD19   (+)  CD20   (-)      CD22   (+) Dim  CD23   (+)      CD33   (-)  CD34   (-)      CD38   (-)  CD45   (+)      CD56   (-)  CD57   (-)      CD103   (-)  CD117   (-)      FMC-7   (-)  HLA-DR  (+)      KAPPA   (+) Dim  LAMBDA  (-)      CD64   (-)   PATHOLOGIST NAME Comment   Comment: Gaston Islam, M.D.  COMMENT: Comment VC   Comment: (NOTE)  Each antibody in this assay was utilized to assess for potential  abnormalities of studied cell populations or to characterize  identified abnormalities.  This test was developed and its performance characteristics  determined by [facility]. It has not been cleared or approved by the  U.S. Food and Drug Administration.  The FDA has determined that such clearance or  approval is not  necessary. This test is used for clinical purposes. It should not  be regarded as investigational or for research.  Performed At: -[facility]  [place], [place] [id number]  Katina Degree MDPhD [id number]:[id number]  Performed At: [facility]  [address] [place], [place] [id number]  Katina Degree MDPhD KN:[id number]   Resulting Agency [facility]      Specimen Collected: 10/25/19 09:44 Last Resulted: 10/27/19 17:36     Lab Flowsheet   Order Details   View Encounter   Lab and Collection Details   Routing   Result History     VC=Value has a corrected status       Other Results from 10/25/2019  Contains abnormal data CBC with Differential/Platelet Order: [id number]  Status:  Final result Visible to patient:  Yes (MyChart) Next appt:  11/08/2019 at 11:20 AM in Radiology [facility] MM DIAGNOSTIC) Dx:  Lymphocytosis  Ref Range & Units 10d ago 14yrago 23yrgo  WBC 4.0 - 10.5 K/uL 24.1High   9.8 R  7.7 R   RBC 3.87 - 5.11 MIL/uL 4.22  4.56 R  3.92  R   Hemoglobin 12.0 - 15.0 g/dL 12.7  13.1 R  11.6Low  R   HCT 36.0 - 46.0 % 37.6  39.1 R  33.4Low  R   MCV 80.0 - 100.0 fL 89.1  85.8  85.1   MCH 26.0 - 34.0 pg 30.1  28.7  29.6   MCHC 30.0 - 36.0 g/dL 33.8  33.5 R  34.7 R   RDW 11.5 - 15.5 % 12.9  14.1 R  15.6High  R   Platelets 150 - 400 K/uL 202  179 R  194 R   nRBC 0.0 - 0.2 % 0.0     Neutrophils Relative % % 25  49  55   Neutro Abs 1.7 - 7.7 K/uL 6.1  4.8 R  4.1 R   Lymphocytes Relative % 61  45  33   Lymphs Abs 0.7 - 4.0 K/uL 14.9High   4.4High  R  2.6 R   Monocytes Relative % _0 Monocytes Absolute 0.1 - 1.0 K/uL 2.6High   0.3 R  0.8 R   Eosinophils Relative % 1  2  0   Eosinophils Absolute 0.0 - 0.5 K/uL 0.2  0.2 R  0.0 R   Basophils Relative % _1 Basophils Absolute 0.0 - 0.1 K/uL 0.1  0.1 R  0.1 R   WBC Morphology  ABSOLUTE LYMPHOCYTOSIS     Comment: RARE BLAST LIKE CELL NOTED ON SMEAR. VARIANT LYMPH POPULATION NOTED ON SMEAR.   RBC Morphology  MORPHOLOGY UNREMARKABLE     Smear Review  Normal platelet morphology     Comment: PLATELETS APPEAR ADEQUATE  Immature Granulocytes % 1     Abs Immature Granulocytes 0.00 - 0.07 K/uL 0.24High      Comment: Performed at [facility] [place] [id number] Agency  [facility]      Specimen Collected: 10/25/19 09:44 Last Resulted: 10/25/19 10:38

## 2019-11-08 ENCOUNTER — Ambulatory Visit
Admission: RE | Admit: 2019-11-08 | Discharge: 2019-11-08 | Disposition: A | Discharge status: Home / Self Care | Payer: Medicare HMO | Source: Ambulatory Visit | Attending: Surgery | Admitting: Surgery

## 2019-11-08 DIAGNOSIS — R920 Mammographic microcalcification found on diagnostic imaging of breast: Secondary | ICD-10-CM

## 2019-11-08 DIAGNOSIS — C50412 Malignant neoplasm of upper-outer quadrant of left female breast: Secondary | ICD-10-CM | POA: Insufficient documentation

## 2019-11-08 DIAGNOSIS — Z171 Estrogen receptor negative status [ER-]: Secondary | ICD-10-CM | POA: Diagnosis present

## 2019-11-14 ENCOUNTER — Telehealth: Payer: Self-pay

## 2019-11-14 NOTE — Telephone Encounter (Signed)
Spoke with patient to notify her of recent mammogram results. Patient states she prefers to follow up with Dr.Byrnett's office. Patient states if her insurance covers the visit then she would see him and she has his telephone number.

## 2019-11-14 NOTE — Telephone Encounter (Signed)
-----   Message from Jules Husbands, MD sent at 11/13/2019  9:41 AM EST ----- Please let her know mammo is nml, may f/u w any of [place] ----- Message ----- From: Interface, Rad Results In Sent: 11/08/2019   1:41 PM EST To: Jules Husbands, MD

## 2019-11-24 ENCOUNTER — Ambulatory Visit: Payer: Medicare HMO | Admitting: Oncology

## 2020-01-31 ENCOUNTER — Other Ambulatory Visit: Payer: Self-pay

## 2020-02-01 ENCOUNTER — Other Ambulatory Visit: Payer: Self-pay

## 2020-02-01 ENCOUNTER — Inpatient Hospital Stay: Payer: Medicare HMO | Attending: Oncology

## 2020-02-01 DIAGNOSIS — G8929 Other chronic pain: Secondary | ICD-10-CM | POA: Insufficient documentation

## 2020-02-01 DIAGNOSIS — Z171 Estrogen receptor negative status [ER-]: Secondary | ICD-10-CM | POA: Diagnosis not present

## 2020-02-01 DIAGNOSIS — Z8249 Family history of ischemic heart disease and other diseases of the circulatory system: Secondary | ICD-10-CM | POA: Diagnosis not present

## 2020-02-01 DIAGNOSIS — Z853 Personal history of malignant neoplasm of breast: Secondary | ICD-10-CM | POA: Insufficient documentation

## 2020-02-01 DIAGNOSIS — Z87891 Personal history of nicotine dependence: Secondary | ICD-10-CM | POA: Diagnosis not present

## 2020-02-01 DIAGNOSIS — M549 Dorsalgia, unspecified: Secondary | ICD-10-CM | POA: Diagnosis not present

## 2020-02-01 DIAGNOSIS — Z9221 Personal history of antineoplastic chemotherapy: Secondary | ICD-10-CM | POA: Diagnosis not present

## 2020-02-01 DIAGNOSIS — Z923 Personal history of irradiation: Secondary | ICD-10-CM | POA: Insufficient documentation

## 2020-02-01 DIAGNOSIS — Z79899 Other long term (current) drug therapy: Secondary | ICD-10-CM | POA: Insufficient documentation

## 2020-02-01 DIAGNOSIS — C9110 Chronic lymphocytic leukemia of B-cell type not having achieved remission: Secondary | ICD-10-CM | POA: Insufficient documentation

## 2020-02-01 DIAGNOSIS — Z801 Family history of malignant neoplasm of trachea, bronchus and lung: Secondary | ICD-10-CM | POA: Insufficient documentation

## 2020-02-01 DIAGNOSIS — Z803 Family history of malignant neoplasm of breast: Secondary | ICD-10-CM | POA: Insufficient documentation

## 2020-02-01 DIAGNOSIS — D72820 Lymphocytosis (symptomatic): Secondary | ICD-10-CM

## 2020-02-01 LAB — CBC WITH DIFFERENTIAL/PLATELET
Abs Immature Granulocytes: 0.25 10*3/uL — ABNORMAL HIGH (ref 0.00–0.07)
Basophils Absolute: 0.1 10*3/uL (ref 0.0–0.1)
Basophils Relative: 1 %
Eosinophils Absolute: 0.5 10*3/uL (ref 0.0–0.5)
Eosinophils Relative: 2 %
HCT: 38.8 % (ref 36.0–46.0)
Hemoglobin: 12.7 g/dL (ref 12.0–15.0)
Immature Granulocytes: 1 %
Lymphocytes Relative: 55 %
Lymphs Abs: 13.3 10*3/uL — ABNORMAL HIGH (ref 0.7–4.0)
MCH: 29.8 pg (ref 26.0–34.0)
MCHC: 32.7 g/dL (ref 30.0–36.0)
MCV: 91.1 fL (ref 80.0–100.0)
Monocytes Absolute: 3.5 10*3/uL — ABNORMAL HIGH (ref 0.1–1.0)
Monocytes Relative: 14 %
Neutro Abs: 6.4 10*3/uL (ref 1.7–7.7)
Neutrophils Relative %: 27 %
Platelets: 186 10*3/uL (ref 150–400)
RBC: 4.26 MIL/uL (ref 3.87–5.11)
RDW: 12.1 % (ref 11.5–15.5)
Smear Review: NORMAL
WBC Morphology: ABNORMAL
WBC: 24 10*3/uL — ABNORMAL HIGH (ref 4.0–10.5)
nRBC: 0 % (ref 0.0–0.2)

## 2020-02-02 ENCOUNTER — Telehealth: Payer: Self-pay | Admitting: *Deleted

## 2020-02-02 NOTE — Telephone Encounter (Signed)
Patient called asking for results  CBC with Differential/Platelet Order: [id number] Status:  Final result  Visible to patient:  Yes (MyChart)  Next appt:  04/24/2020 at 10:30 AM in Oncology ([facility])  Dx:  Lymphocytosis  Ref Range & Units 1 d ago  WBC 4.0 - 10.5 K/uL 24.0High    RBC 3.87 - 5.11 MIL/uL 4.26   Hemoglobin 12.0 - 15.0 g/dL 12.7   HCT 36.0 - 46.0 % 38.8   MCV 80.0 - 100.0 fL 91.1   MCH 26.0 - 34.0 pg 29.8   MCHC 30.0 - 36.0 g/dL 32.7   RDW 11.5 - 15.5 % 12.1   Platelets 150 - 400 K/uL 186   nRBC 0.0 - 0.2 % 0.0   Neutrophils Relative % % 27   Neutro Abs 1.7 - 7.7 K/uL 6.4   Lymphocytes Relative % 55   Lymphs Abs 0.7 - 4.0 K/uL 13.3High    Monocytes Relative % 14   Monocytes Absolute 0.1 - 1.0 K/uL 3.5High    Eosinophils Relative % 2   Eosinophils Absolute 0.0 - 0.5 K/uL 0.5   Basophils Relative % 1   Basophils Absolute 0.0 - 0.1 K/uL 0.1   WBC Morphology  Abnormal lymphocytes present   Comment: ABSOLUTE LYMPHOCYTOSIS  RBC Morphology  MORPHOLOGY UNREMARKABLE   Smear Review  Normal platelet morphology   Comment: PLATELETS APPEAR ADEQUATE  Reviewed   Immature Granulocytes % 1   Abs Immature Granulocytes 0.00 - 0.07 K/uL 0.25High    Comment: Performed at [facility], [address], [place], [place] [address]  Resulting Agency  [facility] CLIN LAB      Specimen Collected: 02/01/20 08:45  Last Resulted: 02/01/20 09:21

## 2020-02-02 NOTE — Telephone Encounter (Signed)
Pt is scheduled for 2/25 @ 9:45. She is aware.  Thank you Jerene Pitch

## 2020-02-02 NOTE — Telephone Encounter (Signed)
I am pretty confident we have discussed the fact that she has CLL multiple times and I am not worried about her white count at this time. Please schedule in person clinic visit next week to further discuss with the patient.

## 2020-02-09 ENCOUNTER — Other Ambulatory Visit: Payer: Self-pay

## 2020-02-09 ENCOUNTER — Inpatient Hospital Stay: Payer: Medicare HMO | Admitting: Oncology

## 2020-02-09 VITALS — BP 193/83 | HR 73 | Temp 97.2°F | Wt 187.5 lb

## 2020-02-09 DIAGNOSIS — C9110 Chronic lymphocytic leukemia of B-cell type not having achieved remission: Secondary | ICD-10-CM

## 2020-02-09 DIAGNOSIS — Z853 Personal history of malignant neoplasm of breast: Secondary | ICD-10-CM | POA: Diagnosis not present

## 2020-02-09 DIAGNOSIS — C50412 Malignant neoplasm of upper-outer quadrant of left female breast: Secondary | ICD-10-CM | POA: Diagnosis not present

## 2020-02-09 DIAGNOSIS — Z171 Estrogen receptor negative status [ER-]: Secondary | ICD-10-CM | POA: Diagnosis not present

## 2020-02-10 DIAGNOSIS — C9110 Chronic lymphocytic leukemia of B-cell type not having achieved remission: Secondary | ICD-10-CM | POA: Insufficient documentation

## 2020-02-10 NOTE — Progress Notes (Signed)
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  CLL  INTERVAL HISTORY: Patient returns to clinic today for repeat laboratory work and further evaluation.  She continues to have chronic back pain that is unchanged.  She is anxious, but otherwise feels well. She has no neurologic complaints. She denies any recent fevers or illnesses. She has a good appetite and denies weight loss.  She denies any chest pain, shortness of breath, cough, or hemoptysis.  She denies any nausea, vomiting, constipation, or diarrhea. She has no urinary complaints.  Patient offers no further specific complaints today.  REVIEW OF SYSTEMS:   Review of Systems  Constitutional: Negative.  Negative for fever, malaise/fatigue and weight loss.  Respiratory: Negative.  Negative for cough, hemoptysis and shortness of breath.   Cardiovascular: Negative.  Negative for chest pain and leg swelling.  Gastrointestinal: Negative.  Negative for abdominal pain and constipation.  Genitourinary: Negative.  Negative for dysuria.  Musculoskeletal: Positive for back pain.  Skin: Negative.  Negative for rash.  Neurological: Negative.  Negative for sensory change, focal weakness, weakness and headaches.  Psychiatric/Behavioral: The patient is nervous/anxious. The patient does not have insomnia.     As per HPI. Otherwise, a complete review of systems is negative.  PAST MEDICAL  HISTORY: Past Medical History:  Diagnosis Date  . Arthritis   . Breast cancer [facility]) 2007   right lumpectomy  . Breast cancer ([place]) 03/31/2017   16 mm invasive mammary carcinoma, left upper outer quadrant, T1c, N0, triple negative, histologic grade 3.  . Family history of adverse reaction to anesthesia    mom n/v  . GERD (gastroesophageal reflux disease)   . Heart murmur   . Lumbar radiculitis   . Lumbar stenosis   . Personal history of chemotherapy 2018   Left  . Personal history of radiation therapy 2007   Right  . Personal history of radiation therapy 2018   Left    PAST SURGICAL HISTORY: Past Surgical History:  Procedure Laterality Date  . ABDOMINAL HYSTERECTOMY     total  . BACK SURGERY     lumbar  . BREAST BIOPSY Bilateral 03/31/2017   bilat u/s core INVASIVE MAMMARY CARCINOMA  . BREAST BIOPSY Right 03/31/2017   DENSE FIBROSIS AND SHEETS OF MACROPHAGES  . BREAST EXCISIONAL BIOPSY Left 2018  . BREAST LUMPECTOMY Right 2007  . BREAST LUMPECTOMY Left 04/23/2017  . BREAST LUMPECTOMY Left 04/23/2017   Procedure: BREAST LUMPECTOMY WITH EXCISION OF SENTINEL NODE;  Surgeon: Robert Bellow, MD;  Whole breast radiation ORS;  Service: General;  Laterality: Left;  . BREAST SURGERY Right    Wide excision  . EYE SURGERY     cataracts bil  . FRACTURE SURGERY Left 2015 or 2016  . TUMOR EXCISION  2001    FAMILY HISTORY: Family History  Problem Relation Age of Onset  . Breast cancer Sister 30  . Lung cancer Mother 75       mets to breast  . Lung cancer Father 55  .  Heart attack Brother   . Breast cancer Maternal Aunt        age unknown  . Stroke Sister 66    ADVANCED DIRECTIVES (Y/N):  N  HEALTH MAINTENANCE: Social History   Tobacco Use  . Smoking status: Former Smoker    Packs/day: 0.25    Years: 10.00    Pack years: 2.50    Types: Cigarettes    Quit date: 1970    Years since quitting: 51.1  . Smokeless tobacco: Never Used  Substance Use Topics  .  Alcohol use: Yes    Comment: occasionally  . Drug use: No     Colonoscopy:  PAP:  Bone density:  Lipid panel:  Allergies  Allergen Reactions  . Oxycodone Other (See Comments)    Can't sleep, feels bad when taking.    Current Outpatient Medications  Medication Sig Dispense Refill  . acetaminophen (TYLENOL) 500 MG tablet Take 1,000 mg by mouth every 6 (six) hours as needed (for pain.).    Marland Kitchen Calcium Carb-Cholecalciferol (CALCIUM 600+D) 600-800 MG-UNIT TABS Take 1 tablet by mouth every morning.     . calcium carbonate (TUMS EX) 750 MG chewable tablet Chew 1-2 tablets by mouth 3 (three) times daily as needed (for heartburn/indigestion).    . Cholecalciferol (VITAMIN D-3) 5000 units TABS Take 5,000 Units by mouth daily.    . magnesium oxide (MAG-OX) 400 MG tablet Take 250 mg by mouth daily.     . Multiple Vitamin (MULTIVITAMIN WITH MINERALS) TABS tablet Take 1 tablet by mouth daily. Senior Multivitamin    . Potassium 99 MG TABS Take 99 mg by mouth daily.     . vitamin C (ASCORBIC ACID) 500 MG tablet Take 500 mg by mouth daily.     No current facility-administered medications for this visit.    OBJECTIVE: Vitals:   02/09/20 0946  BP: (!) 193/83  Pulse: 73  Temp: (!) 97.2 F (36.2 C)     Body mass index is 34.29 kg/m.    ECOG FS:0 - Asymptomatic  General: Well-developed, well-nourished, no acute distress. Eyes: Pink conjunctiva, anicteric sclera. HEENT: Normocephalic, moist mucous membranes. Breast: Exam deferred today. Lungs: No audible wheezing or coughing. Heart: Regular rate and rhythm. Abdomen: Soft, nontender, no obvious distention. Musculoskeletal: No edema, cyanosis, or clubbing. Neuro: Alert, answering all questions appropriately. Cranial nerves grossly intact. Skin: No rashes or petechiae noted. Psych: Normal affect.  LAB RESULTS:  Lab Results  Component Value Date   NA 138 11/12/2017   K 4.1 11/12/2017   CL 106 11/12/2017   CO2 22 11/12/2017   GLUCOSE  169 (H) 11/12/2017   BUN 18 11/12/2017   CREATININE 0.86 11/12/2017   CALCIUM 9.3 11/12/2017   PROT 7.2 11/12/2017   ALBUMIN 4.4 11/12/2017   AST 29 11/12/2017   ALT 22 11/12/2017   ALKPHOS 69 11/12/2017   BILITOT 0.5 11/12/2017   GFRNONAA >60 11/12/2017   GFRAA >60 11/12/2017    Lab Results  Component Value Date   WBC 24.0 (H) 02/01/2020   NEUTROABS 6.4 02/01/2020   HGB 12.7 02/01/2020   HCT 38.8 02/01/2020   MCV 91.1 02/01/2020   PLT 186 02/01/2020     STUDIES: No results found.  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  CLL.  PLAN:    1. 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 This was likely a second primary.  Patient completed 4 cycles of Taxotere and Cytoxan August 06, 2017.  Also completed adjuvant XRT.  An aromatase inhibitor would not be of any benefit given the ER/PR status of her tumor.  Patient's most recent mammogram on March 30, 2019 was reported as BI-RADS 3.  Patient had a unilateral left mammogram on November 08, 2019 that was reported as BI-RADS 3.  Repeat bilateral diagnostic mammogram in April 2021.   2. 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 Oncotype score 17: Originally diagnosed in 2008. Patient completed 5 years of Arimidex in approximately August 2013. Biopsy of the right breast only revealed fat necrosis at the site of her previous MammoSite.  Mammogram as above. 3. Genetic testing: Patient has a sister and mother both with breast cancer. 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 4.  Back pain: Chronic and unchanged. 5.  CLL: Patient's white blood cell count is elevated, but stable at 24.0.  Flow cytometry confirmed diagnosis.   No intervention is needed at this time.  Patient does not require treatment or a bone marrow biopsy.  Can consider imaging in the future if there is suspicion of progression of disease.  Return to clinic in 3 months for laboratory work only and then in 6 months for laboratory work and further evaluation.    I spent a total of 30 minutes reviewing chart data, face-to-face evaluation with the patient, counseling and coordination of care as detailed above.   Patient expressed understanding and was in agreement with this plan. She also understands that She can call clinic at any time with any questions, concerns, or complaints.   Cancer Staging Carcinoma of upper-outer quadrant of left breast in female, estrogen receptor negative ([place]) Staging form: Breast, AJCC 8th Edition - Clinical stage from 04/08/2017: Stage IB (cT1c, cN0, cM0, G3, ER: Negative, PR: Negative, HER2: Negative) - Signed by Lloyd Huger, MD on 04/08/2017 - Pathologic stage from 05/11/2017: Stage IB (pT1c, pN0, cM0, G3, ER: Negative, PR: Negative, HER2: Negative) - Signed by Lloyd Huger, MD on 05/11/2017   Lloyd Huger, MD   02/10/2020 12:44 PM

## 2020-03-29 ENCOUNTER — Other Ambulatory Visit: Payer: Self-pay | Admitting: General Surgery

## 2020-03-29 DIAGNOSIS — C50412 Malignant neoplasm of upper-outer quadrant of left female breast: Secondary | ICD-10-CM

## 2020-03-29 DIAGNOSIS — Z171 Estrogen receptor negative status [ER-]: Secondary | ICD-10-CM

## 2020-04-24 ENCOUNTER — Other Ambulatory Visit: Payer: Medicare HMO

## 2020-04-24 ENCOUNTER — Ambulatory Visit: Payer: Medicare HMO | Admitting: Oncology

## 2020-05-08 ENCOUNTER — Inpatient Hospital Stay: Payer: Medicare HMO

## 2020-05-09 ENCOUNTER — Other Ambulatory Visit: Payer: Self-pay

## 2020-05-09 ENCOUNTER — Ambulatory Visit
Admission: RE | Admit: 2020-05-09 | Discharge: 2020-05-09 | Disposition: A | Discharge status: Home / Self Care | Payer: Medicare HMO | Source: Ambulatory Visit | Attending: General Surgery | Admitting: General Surgery

## 2020-05-09 ENCOUNTER — Inpatient Hospital Stay: Payer: Medicare HMO | Attending: Oncology

## 2020-05-09 DIAGNOSIS — C9110 Chronic lymphocytic leukemia of B-cell type not having achieved remission: Secondary | ICD-10-CM | POA: Insufficient documentation

## 2020-05-09 DIAGNOSIS — Z87891 Personal history of nicotine dependence: Secondary | ICD-10-CM | POA: Insufficient documentation

## 2020-05-09 DIAGNOSIS — Z171 Estrogen receptor negative status [ER-]: Secondary | ICD-10-CM

## 2020-05-09 DIAGNOSIS — Z9221 Personal history of antineoplastic chemotherapy: Secondary | ICD-10-CM | POA: Diagnosis not present

## 2020-05-09 DIAGNOSIS — D72820 Lymphocytosis (symptomatic): Secondary | ICD-10-CM

## 2020-05-09 DIAGNOSIS — Z923 Personal history of irradiation: Secondary | ICD-10-CM | POA: Insufficient documentation

## 2020-05-09 DIAGNOSIS — C50412 Malignant neoplasm of upper-outer quadrant of left female breast: Secondary | ICD-10-CM

## 2020-05-09 DIAGNOSIS — Z853 Personal history of malignant neoplasm of breast: Secondary | ICD-10-CM | POA: Diagnosis not present

## 2020-05-09 DIAGNOSIS — Z803 Family history of malignant neoplasm of breast: Secondary | ICD-10-CM | POA: Diagnosis not present

## 2020-05-09 DIAGNOSIS — Z90710 Acquired absence of both cervix and uterus: Secondary | ICD-10-CM | POA: Insufficient documentation

## 2020-05-09 LAB — CBC WITH DIFFERENTIAL/PLATELET
Abs Immature Granulocytes: 0 10*3/uL (ref 0.00–0.07)
Basophils Absolute: 0.3 10*3/uL — ABNORMAL HIGH (ref 0.0–0.1)
Basophils Relative: 1 %
Eosinophils Absolute: 0.7 10*3/uL — ABNORMAL HIGH (ref 0.0–0.5)
Eosinophils Relative: 2 %
HCT: 34.7 % — ABNORMAL LOW (ref 36.0–46.0)
Hemoglobin: 11.8 g/dL — ABNORMAL LOW (ref 12.0–15.0)
Lymphocytes Relative: 74 %
Lymphs Abs: 25.2 10*3/uL — ABNORMAL HIGH (ref 0.7–4.0)
MCH: 29.6 pg (ref 26.0–34.0)
MCHC: 34 g/dL (ref 30.0–36.0)
MCV: 87 fL (ref 80.0–100.0)
Monocytes Absolute: 1.7 10*3/uL — ABNORMAL HIGH (ref 0.1–1.0)
Monocytes Relative: 5 %
Neutro Abs: 6.1 10*3/uL (ref 1.7–7.7)
Neutrophils Relative %: 18 %
Platelets: 175 10*3/uL (ref 150–400)
RBC: 3.99 MIL/uL (ref 3.87–5.11)
RDW: 13.4 % (ref 11.5–15.5)
Smear Review: NORMAL
WBC Morphology: ABNORMAL
WBC: 34.1 10*3/uL — ABNORMAL HIGH (ref 4.0–10.5)
nRBC: 0 % (ref 0.0–0.2)

## 2020-05-23 ENCOUNTER — Ambulatory Visit
Admission: RE | Admit: 2020-05-23 | Discharge: 2020-05-23 | Disposition: A | Discharge status: Home / Self Care | Payer: Medicare HMO | Source: Ambulatory Visit | Attending: Radiation Oncology | Admitting: Radiation Oncology

## 2020-05-23 ENCOUNTER — Other Ambulatory Visit: Payer: Self-pay

## 2020-05-23 ENCOUNTER — Encounter: Payer: Self-pay | Admitting: Radiation Oncology

## 2020-05-23 VITALS — BP 155/68 | HR 74 | Wt 179.8 lb

## 2020-05-23 DIAGNOSIS — N6324 Unspecified lump in the left breast, lower inner quadrant: Secondary | ICD-10-CM | POA: Insufficient documentation

## 2020-05-23 DIAGNOSIS — Z171 Estrogen receptor negative status [ER-]: Secondary | ICD-10-CM

## 2020-05-23 DIAGNOSIS — Z853 Personal history of malignant neoplasm of breast: Secondary | ICD-10-CM | POA: Diagnosis not present

## 2020-05-23 DIAGNOSIS — C9190 Lymphoid leukemia, unspecified not having achieved remission: Secondary | ICD-10-CM | POA: Diagnosis not present

## 2020-05-23 DIAGNOSIS — Z923 Personal history of irradiation: Secondary | ICD-10-CM | POA: Diagnosis not present

## 2020-05-23 NOTE — Progress Notes (Signed)
Radiation Oncology Follow up Note  Name: Megan Santana   Date:   05/23/2020 MRN:  [id number] DOB: Feb 20, 1934    This 86 y.o. female presents to the clinic today for 3-year follow-up status post accelerated partial breast radiation to her left breast for stage I ER/PR negative invasive mammary carcinoma.  REFERRING PROVIDER: Lynnell Jude, MD  HPI: Patient is an 86 year old female now out 3 years having completed accelerated partial breast irradiation to her left breast for stage I ER/PR negative invasive mammary carcinoma seen today in routine follow-up she is doing well.  She specifically denies breast tenderness cough or bone pain she is recently diagnosed with CLL is under observation for that.  Recent mammograms.  Demonstrated suspicious calcifications of the left inferior left breast spanning 4 cm.  Biopsy was recommended although she is seen Dr. Tollie Pizza her surgeon who has advised her to wait 6 months for reevaluation.  COMPLICATIONS OF TREATMENT: none  FOLLOW UP COMPLIANCE: keeps appointments   PHYSICAL EXAM:  BP (!) 155/68 (BP Location: Left Arm, Patient Position: Sitting)   Pulse 74   Wt 179 lb 12.8 oz (81.6 kg)   SpO2 98%   BMI 32.89 kg/m  Lungs are clear to A&P cardiac examination essentially unremarkable with regular rate and rhythm. No dominant mass or nodularity is noted in either breast in 2 positions examined. Incision is well-healed. No axillary or supraclavicular adenopathy is appreciated. Cosmetic result is excellent.  Well-developed well-nourished patient in NAD. HEENT reveals PERLA, EOMI, discs not visualized.  Oral cavity is clear. No oral mucosal lesions are identified. Neck is clear without evidence of cervical or supraclavicular adenopathy. Lungs are clear to A&P. Cardiac examination is essentially unremarkable with regular rate and rhythm without murmur rub or thrill. Abdomen is benign with no organomegaly or masses noted. Motor sensory and DTR levels are equal and  symmetric in the upper and lower extremities. Cranial nerves II through XII are grossly intact. Proprioception is intact. No peripheral adenopathy or edema is identified. No motor or sensory levels are noted. Crude visual fields are within normal range.  RADIOLOGY RESULTS: Mammograms reviewed compatible with above-stated findings  PLAN: Present time patient is doing well.  She continues close follow-up care with Dr. Tollie Pizza who will be ordering and reviewing her next set of mammograms in approximately 6 months.  I have asked to see her back around that time for follow-up.  Otherwise she continues to do well.  Patient knows to call with any concerns.  I would like to take this opportunity to thank you for allowing me to participate in the care of your patient.Noreene Filbert, MD

## 2020-05-28 LAB — FISH HES LEUKEMIA, 4Q12 REA

## 2020-08-02 NOTE — Progress Notes (Signed)
[place]  Telephone:(336) [phone] Fax:(336) [phone]  ID: Megan Santana OB: [birth year]  MR#: [id number]  XYV#:[id number]  Patient Care Team: Lynnell Jude, MD as PCP - General (Family Medicine) Rico Junker, RN as Oncology Nurse Navigator Grayland Ormond, Kathlene November, MD as Consulting Physician (Oncology) Clemmie Krill Lynnell Jude, MD as Referring Physician (Family Medicine) Bary Castilla, Forest Gleason, MD (General Surgery) Noreene Filbert, MD as Referring Physician (Radiation Oncology)  I connected with Megan Santana on 08/09/20 at 10:45 AM EDT by video enabled telemedicine visit and verified that I am speaking with the correct person using two identifiers.   I discussed the limitations, risks, security and privacy concerns of performing an evaluation and management service by telemedicine and the availability of in-person appointments. I also discussed with the patient that there may be a patient responsible charge related to this service. The patient expressed understanding and agreed to proceed.   Other persons participating in the visit and their role in the encounter: Patient, MD.  Patient's location: Clinic. Provider's location: Home.  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  CLL  INTERVAL HISTORY: Patient agreed to video assisted telemedicine visit for routine evaluation and discussion of her laboratory results.  She has noticed increased weakness and fatigue as well as a poor appetite over the past several weeks.  She continues to have chronic back pain.  She has no neurologic complaints. She denies any recent fevers or illnesses.  She denies any night sweats or unintentional weight loss.  She denies any chest pain, shortness of breath, cough, or hemoptysis.  She denies any nausea, vomiting, constipation, or diarrhea. She has no urinary complaints.  Patient offers no further specific complaints today.     REVIEW OF SYSTEMS:   Review of Systems  Constitutional: Positive for malaise/fatigue. Negative for fever and weight loss.  Respiratory: Negative.  Negative for cough, hemoptysis and shortness of breath.   Cardiovascular: Negative.  Negative for chest pain and leg swelling.  Gastrointestinal: Negative.  Negative for abdominal pain and constipation.  Genitourinary: Negative.  Negative for dysuria.  Musculoskeletal: Positive for back pain.  Skin: Negative.  Negative for rash.  Neurological: Positive for weakness. Negative for sensory change, focal weakness and headaches.  Psychiatric/Behavioral: The patient is nervous/anxious. The patient does not have insomnia.     As per HPI. Otherwise, a complete review of systems is negative.  PAST MEDICAL HISTORY: Past Medical History:  Diagnosis Date  . Arthritis   . Breast cancer [facility]) 2007   right lumpectomy  . Breast cancer ([place]) 03/31/2017   16 mm invasive mammary carcinoma, left upper outer quadrant, T1c, N0, triple negative, histologic grade 3.  . Family history of adverse reaction to anesthesia    mom n/v  . GERD (gastroesophageal reflux disease)   . Heart murmur   . Lumbar radiculitis   . Lumbar stenosis   . Personal history of chemotherapy 2018   Left  . Personal history of radiation therapy 2007   Right  . Personal history of radiation therapy 2018   Left    PAST SURGICAL HISTORY: Past Surgical History:  Procedure Laterality Date  . ABDOMINAL HYSTERECTOMY     total  . BACK SURGERY     lumbar  . BREAST BIOPSY Bilateral 03/31/2017   bilat u/s core INVASIVE MAMMARY CARCINOMA  . BREAST BIOPSY Right 03/31/2017   DENSE FIBROSIS AND SHEETS OF MACROPHAGES  . BREAST EXCISIONAL  BIOPSY Left 2018  . BREAST LUMPECTOMY Right 2007  . BREAST LUMPECTOMY Left 04/23/2017  . BREAST LUMPECTOMY Left 04/23/2017   Procedure: BREAST LUMPECTOMY WITH EXCISION OF SENTINEL NODE;  Surgeon: Robert Bellow, MD;  Whole breast radiation ORS;   Service: General;  Laterality: Left;  . BREAST SURGERY Right    Wide excision  . EYE SURGERY     cataracts bil  . FRACTURE SURGERY Left 2015 or 2016  . TUMOR EXCISION  2001    FAMILY HISTORY: Family History  Problem Relation Age of Onset  . Breast cancer Sister 70  . Lung cancer Mother 64       mets to breast  . Lung cancer Father 52  . Heart attack Brother   . Breast cancer Maternal Aunt        age unknown  . Stroke Sister 44    ADVANCED DIRECTIVES (Y/N):  N  HEALTH MAINTENANCE: Social History   Tobacco Use  . Smoking status: Former Smoker    Packs/day: 0.25    Years: 10.00    Pack years: 2.50    Types: Cigarettes    Quit date: 1970    Years since quitting: 51.6  . Smokeless tobacco: Never Used  Vaping Use  . Vaping Use: Never used  Substance Use Topics  . Alcohol use: Yes    Comment: occasionally  . Drug use: No     Colonoscopy:  PAP:  Bone density:  Lipid panel:  Allergies  Allergen Reactions  . Oxycodone Other (See Comments)    Can't sleep, feels bad when taking.    Current Outpatient Medications  Medication Sig Dispense Refill  . acetaminophen (TYLENOL) 500 MG tablet Take 1,000 mg by mouth every 6 (six) hours as needed (for pain.).    Marland Kitchen Calcium Carb-Cholecalciferol (CALCIUM 600+D) 600-800 MG-UNIT TABS Take 1 tablet by mouth every morning.     . calcium carbonate (TUMS EX) 750 MG chewable tablet Chew 1-2 tablets by mouth 3 (three) times daily as needed (for heartburn/indigestion).    . cholecalciferol (VITAMIN D3) 25 MCG (1000 UNIT) tablet Take 1,000 Units by mouth daily.    . magnesium oxide (MAG-OX) 400 MG tablet Take 250 mg by mouth daily.     . Multiple Vitamins-Minerals (CENTRUM SILVER 50+WOMEN) TABS Take 1 tablet by mouth daily.    . Potassium 99 MG TABS Take 99 mg by mouth daily.     . vitamin C (ASCORBIC ACID) 500 MG tablet Take 500 mg by mouth daily.     No current facility-administered medications for this visit.     OBJECTIVE: Vitals:   08/08/20 1330 08/09/20 1011  BP:  132/79  Pulse:  85  Resp: 20 16  Temp:  99.3 F (37.4 C)  SpO2:  99%     Body mass index is 30.87 kg/m.    ECOG FS:1 - Symptomatic but completely ambulatory  General: Well-developed, well-nourished, no acute distress. HEENT: Normocephalic.  Right cervical lymphadenopathy noted. Neuro: Alert, answering all questions appropriately. Cranial nerves grossly intact. Psych: Normal affect.   LAB RESULTS:  Lab Results  Component Value Date   NA 138 11/12/2017   K 4.1 11/12/2017   CL 106 11/12/2017   CO2 22 11/12/2017   GLUCOSE 169 (H) 11/12/2017   BUN 18 11/12/2017   CREATININE 0.86 11/12/2017   CALCIUM 9.3 11/12/2017   PROT 7.2 11/12/2017   ALBUMIN 4.4 11/12/2017   AST 29 11/12/2017   ALT 22 11/12/2017   ALKPHOS 69  11/12/2017   BILITOT 0.5 11/12/2017   GFRNONAA >60 11/12/2017   GFRAA >60 11/12/2017    Lab Results  Component Value Date   WBC 70.4 (HH) 08/09/2020   NEUTROABS 5.4 08/09/2020   HGB 11.3 (L) 08/09/2020   HCT 34.4 (L) 08/09/2020   MCV 88.9 08/09/2020   PLT 147 (L) 08/09/2020     STUDIES: DG Chest 2 View  Result Date: 08/07/2020 CLINICAL DATA:  86 year old female with nonproductive cough. Previous breast cancer. EXAM: CHEST - 2 VIEW COMPARISON:  Chest radiographs 09/27/2019. FINDINGS: New abnormal right paratracheal density since last year. Other mediastinal contours are stable and within normal limits. Tracheal air column remains within normal limits. Stable lungs, including probable subpleural scarring on the right in the middle lobe. No pneumothorax, pulmonary edema, pleural effusion or new pulmonary opacity. Osteopenia. No acute osseous abnormality identified. Abdominal Calcified aortic atherosclerosis. Negative visible bowel gas pattern. IMPRESSION: 1. Increased right paratracheal density in the superior mediastinum, nonspecific but suspicious for mass or lymphadenopathy. Recommend follow-up Chest  CT (IV contrast preferred). 2. Elsewhere stable radiographic appearance of the chest. These results will be called to the ordering clinician or representative by the Radiologist Assistant, and communication documented in the PACS or [employer]. Electronically Signed   By: Genevie Ann M.D.   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  CLL.  PLAN:    1. 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 This was likely a second primary.  Patient completed 4 cycles of Taxotere and Cytoxan August 06, 2017.  Also completed adjuvant XRT.  An aromatase inhibitor would not be of any benefit given the ER/PR status of her tumor.  Patient's most recent mammogram on May 09, 2020 was reported as BI-RADS 4, but she was instructed by her surgeon to wait 6 months for consideration of a biopsy.  2. 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 Oncotype score 17: Originally diagnosed in 2008. Patient completed 5 years of Arimidex in approximately August 2013. Biopsy of the right breast only revealed fat necrosis at the site of her previous MammoSite.  Mammogram and possible biopsy as above. 3. Genetic testing: Patient has a sister and mother both with breast cancer. 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 4.  Back pain: Chronic and unchanged. 5.  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  She also has visible lymphadenopathy in her right neck.  Recent chest x-ray also has suggested mediastinal lymphadenopathy.  Will get CT scan of the neck, chest, abdomen, and pelvis in the next week.   Given patient's progression of disease she will require treatment and have recommended oral Imbruvica.  Return to clinic in 1 week for discussion of her imaging results and treatment planning.  Patient will also have consultation with clinical pharmacist at that time.    I provided 30 minutes of face-to-face video visit time during this encounter which included chart review, counseling, and coordination of care as documented above.    Patient expressed understanding and was in agreement with this plan. She also understands that She can call clinic  at any time with any questions, concerns, or complaints.   Cancer Staging Carcinoma of upper-outer quadrant of left breast in female, estrogen receptor negative ([place]) Staging form: Breast, AJCC 8th Edition - Clinical stage from 04/08/2017: Stage IB (cT1c, cN0, cM0, G3, ER: Negative, PR: Negative, HER2: Negative) - Signed by Lloyd Huger, MD on 04/08/2017 - Pathologic stage from 05/11/2017: Stage IB (pT1c, pN0, cM0, G3, ER: Negative, PR: Negative, HER2: Negative) - Signed by Lloyd Huger, MD on 05/11/2017   Lloyd Huger, MD   08/09/2020 11:36 AM

## 2020-08-07 ENCOUNTER — Ambulatory Visit
Admission: RE | Admit: 2020-08-07 | Discharge: 2020-08-07 | Disposition: A | Discharge status: Home / Self Care | Payer: Medicare HMO | Attending: Family Medicine | Admitting: Family Medicine

## 2020-08-07 ENCOUNTER — Other Ambulatory Visit: Payer: Self-pay | Admitting: Family Medicine

## 2020-08-07 ENCOUNTER — Ambulatory Visit
Admission: RE | Admit: 2020-08-07 | Discharge: 2020-08-07 | Disposition: A | Discharge status: Home / Self Care | Payer: Medicare HMO | Source: Ambulatory Visit | Attending: Family Medicine | Admitting: Family Medicine

## 2020-08-07 ENCOUNTER — Other Ambulatory Visit: Payer: Self-pay

## 2020-08-07 DIAGNOSIS — R05 Cough: Secondary | ICD-10-CM | POA: Insufficient documentation

## 2020-08-07 DIAGNOSIS — R059 Cough, unspecified: Secondary | ICD-10-CM

## 2020-08-08 ENCOUNTER — Telehealth: Payer: Self-pay | Admitting: *Deleted

## 2020-08-08 NOTE — Progress Notes (Signed)
Patient states she does have some discomfort in chest, neck, and a cough that will not go away. She also reports some weakness. She would like to discuss when her test will be scheduled with provider. No further concerns mentioned at this time.

## 2020-08-08 NOTE — Telephone Encounter (Signed)
Call returned to patient and advised that Dr Grayland Ormond will order CT.

## 2020-08-08 NOTE — Telephone Encounter (Signed)
We can order it tomorrow.

## 2020-08-08 NOTE — Telephone Encounter (Signed)
Patient called reporting that she saw her PCP yesterday who ordered a CXR and that she needs a CT scan done. PCP asking since she has an appointment tomorrow with Dr Grayland Ormond if he wants to order the CT scan or does she need to order it.   IMPRESSION: 1. Increased right paratracheal density in the superior mediastinum, nonspecific but suspicious for mass or lymphadenopathy. Recommend follow-up Chest CT (IV contrast preferred). 2. Elsewhere stable radiographic appearance of the chest.  These results will be called to the ordering clinician or representative by the Radiologist Assistant, and communication documented in the PACS or [employer].   Electronically Signed   By: Genevie Ann M.D.   On: 08/07/2020 15:59Please advise.

## 2020-08-09 ENCOUNTER — Inpatient Hospital Stay: Payer: Medicare HMO | Attending: Oncology | Admitting: Oncology

## 2020-08-09 ENCOUNTER — Encounter: Payer: Self-pay | Admitting: Oncology

## 2020-08-09 ENCOUNTER — Other Ambulatory Visit: Payer: Self-pay

## 2020-08-09 ENCOUNTER — Inpatient Hospital Stay: Payer: Medicare HMO

## 2020-08-09 VITALS — BP 132/79 | HR 85 | Temp 99.3°F | Resp 16 | Wt 168.8 lb

## 2020-08-09 DIAGNOSIS — Z923 Personal history of irradiation: Secondary | ICD-10-CM | POA: Diagnosis not present

## 2020-08-09 DIAGNOSIS — Z9221 Personal history of antineoplastic chemotherapy: Secondary | ICD-10-CM | POA: Insufficient documentation

## 2020-08-09 DIAGNOSIS — Z9079 Acquired absence of other genital organ(s): Secondary | ICD-10-CM | POA: Insufficient documentation

## 2020-08-09 DIAGNOSIS — Z8249 Family history of ischemic heart disease and other diseases of the circulatory system: Secondary | ICD-10-CM | POA: Insufficient documentation

## 2020-08-09 DIAGNOSIS — C9110 Chronic lymphocytic leukemia of B-cell type not having achieved remission: Secondary | ICD-10-CM | POA: Diagnosis not present

## 2020-08-09 DIAGNOSIS — C50412 Malignant neoplasm of upper-outer quadrant of left female breast: Secondary | ICD-10-CM

## 2020-08-09 DIAGNOSIS — Z853 Personal history of malignant neoplasm of breast: Secondary | ICD-10-CM | POA: Diagnosis not present

## 2020-08-09 DIAGNOSIS — Z90710 Acquired absence of both cervix and uterus: Secondary | ICD-10-CM | POA: Insufficient documentation

## 2020-08-09 DIAGNOSIS — Z87891 Personal history of nicotine dependence: Secondary | ICD-10-CM | POA: Insufficient documentation

## 2020-08-09 DIAGNOSIS — G8929 Other chronic pain: Secondary | ICD-10-CM | POA: Insufficient documentation

## 2020-08-09 DIAGNOSIS — Z90722 Acquired absence of ovaries, bilateral: Secondary | ICD-10-CM | POA: Diagnosis not present

## 2020-08-09 DIAGNOSIS — Z79899 Other long term (current) drug therapy: Secondary | ICD-10-CM | POA: Insufficient documentation

## 2020-08-09 DIAGNOSIS — Z803 Family history of malignant neoplasm of breast: Secondary | ICD-10-CM | POA: Insufficient documentation

## 2020-08-09 DIAGNOSIS — M549 Dorsalgia, unspecified: Secondary | ICD-10-CM | POA: Insufficient documentation

## 2020-08-09 DIAGNOSIS — M8580 Other specified disorders of bone density and structure, unspecified site: Secondary | ICD-10-CM | POA: Diagnosis not present

## 2020-08-09 DIAGNOSIS — Z801 Family history of malignant neoplasm of trachea, bronchus and lung: Secondary | ICD-10-CM | POA: Diagnosis not present

## 2020-08-09 DIAGNOSIS — Z171 Estrogen receptor negative status [ER-]: Secondary | ICD-10-CM | POA: Diagnosis not present

## 2020-08-09 DIAGNOSIS — D72820 Lymphocytosis (symptomatic): Secondary | ICD-10-CM

## 2020-08-09 DIAGNOSIS — K219 Gastro-esophageal reflux disease without esophagitis: Secondary | ICD-10-CM | POA: Diagnosis not present

## 2020-08-09 LAB — CBC WITH DIFFERENTIAL/PLATELET
Abs Immature Granulocytes: 0.84 10*3/uL — ABNORMAL HIGH (ref 0.00–0.07)
Basophils Absolute: 0.2 10*3/uL — ABNORMAL HIGH (ref 0.0–0.1)
Basophils Relative: 0 %
Eosinophils Absolute: 0.3 10*3/uL (ref 0.0–0.5)
Eosinophils Relative: 0 %
HCT: 34.4 % — ABNORMAL LOW (ref 36.0–46.0)
Hemoglobin: 11.3 g/dL — ABNORMAL LOW (ref 12.0–15.0)
Immature Granulocytes: 1 %
Lymphocytes Relative: 62 %
Lymphs Abs: 43.6 10*3/uL — ABNORMAL HIGH (ref 0.7–4.0)
MCH: 29.2 pg (ref 26.0–34.0)
MCHC: 32.8 g/dL (ref 30.0–36.0)
MCV: 88.9 fL (ref 80.0–100.0)
Monocytes Absolute: 20.2 10*3/uL — ABNORMAL HIGH (ref 0.1–1.0)
Monocytes Relative: 29 %
Neutro Abs: 5.4 10*3/uL (ref 1.7–7.7)
Neutrophils Relative %: 8 %
Platelets: 147 10*3/uL — ABNORMAL LOW (ref 150–400)
RBC: 3.87 MIL/uL (ref 3.87–5.11)
RDW: 14.4 % (ref 11.5–15.5)
Smear Review: NORMAL
WBC Morphology: ABNORMAL
WBC: 70.4 10*3/uL (ref 4.0–10.5)
nRBC: 0 % (ref 0.0–0.2)

## 2020-08-11 NOTE — Progress Notes (Deleted)
[place]  Telephone:(336) [phone] Fax:(336) [phone]  ID: Megan Santana OB: April 05, 1934  MR#: [id number]  AOZ#:[id number]  Patient Care Team: Lynnell Jude, MD as PCP - General (Family Medicine) Rico Junker, RN as Oncology Nurse Navigator Grayland Ormond, Kathlene November, MD as Consulting Physician (Oncology) Clemmie Krill Lynnell Jude, MD as Referring Physician (Family Medicine) Bary Castilla, Forest Gleason, MD (General Surgery) Noreene Filbert, MD as Referring Physician (Radiation Oncology)  I connected with Megan Santana on 08/11/20 at  2:45 PM EDT by video enabled telemedicine visit and verified that I am speaking with the correct person using two identifiers.   I discussed the limitations, risks, security and privacy concerns of performing an evaluation and management service by telemedicine and the availability of in-person appointments. I also discussed with the patient that there may be a patient responsible charge related to this service. The patient expressed understanding and agreed to proceed.   Other persons participating in the visit and their role in the encounter: Patient, MD.  Patient's location: Clinic. Provider's location: Home.  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  CLL  INTERVAL HISTORY: Patient agreed to video assisted telemedicine visit for routine evaluation and discussion of her laboratory results.  She has noticed increased weakness and fatigue as well as a poor appetite over the past several weeks.  She continues to have chronic back pain.  She has no neurologic complaints. She denies any recent fevers or illnesses.  She denies any night sweats or unintentional weight loss.  She denies any chest pain, shortness of breath, cough, or hemoptysis.  She denies any nausea, vomiting, constipation, or diarrhea. She has no urinary complaints.  Patient offers no further specific complaints today.     REVIEW OF SYSTEMS:   Review of Systems  Constitutional: Positive for malaise/fatigue. Negative for fever and weight loss.  Respiratory: Negative.  Negative for cough, hemoptysis and shortness of breath.   Cardiovascular: Negative.  Negative for chest pain and leg swelling.  Gastrointestinal: Negative.  Negative for abdominal pain and constipation.  Genitourinary: Negative.  Negative for dysuria.  Musculoskeletal: Positive for back pain.  Skin: Negative.  Negative for rash.  Neurological: Positive for weakness. Negative for sensory change, focal weakness and headaches.  Psychiatric/Behavioral: The patient is nervous/anxious. The patient does not have insomnia.     As per HPI. Otherwise, a complete review of systems is negative.  PAST MEDICAL HISTORY: Past Medical History:  Diagnosis Date  . Arthritis   . Breast cancer [facility]) 2007   right lumpectomy  . Breast cancer ([place]) 03/31/2017   16 mm invasive mammary carcinoma, left upper outer quadrant, T1c, N0, triple negative, histologic grade 3.  . Family history of adverse reaction to anesthesia    mom n/v  . GERD (gastroesophageal reflux disease)   . Heart murmur   . Lumbar radiculitis   . Lumbar stenosis   . Personal history of chemotherapy 2018   Left  . Personal history of radiation therapy 2007   Right  . Personal history of radiation therapy 2018   Left    PAST SURGICAL HISTORY: Past Surgical History:  Procedure Laterality Date  . ABDOMINAL HYSTERECTOMY     total  . BACK SURGERY     lumbar  . BREAST BIOPSY Bilateral 03/31/2017   bilat u/s core INVASIVE MAMMARY CARCINOMA  . BREAST BIOPSY Right 03/31/2017   DENSE FIBROSIS AND SHEETS OF MACROPHAGES  . BREAST  EXCISIONAL BIOPSY Left 2018  . BREAST LUMPECTOMY Right 2007  . BREAST LUMPECTOMY Left 04/23/2017  . BREAST LUMPECTOMY Left 04/23/2017   Procedure: BREAST LUMPECTOMY WITH EXCISION OF SENTINEL NODE;  Surgeon: Robert Bellow, MD;  Whole breast radiation ORS;   Service: General;  Laterality: Left;  . BREAST SURGERY Right    Wide excision  . EYE SURGERY     cataracts bil  . FRACTURE SURGERY Left 2015 or 2016  . TUMOR EXCISION  2001    FAMILY HISTORY: Family History  Problem Relation Age of Onset  . Breast cancer Sister 42  . Lung cancer Mother 84       mets to breast  . Lung cancer Father 77  . Heart attack Brother   . Breast cancer Maternal Aunt        age unknown  . Stroke Sister 62    ADVANCED DIRECTIVES (Y/N):  N  HEALTH MAINTENANCE: Social History   Tobacco Use  . Smoking status: Former Smoker    Packs/day: 0.25    Years: 10.00    Pack years: 2.50    Types: Cigarettes    Quit date: 1970    Years since quitting: 51.6  . Smokeless tobacco: Never Used  Vaping Use  . Vaping Use: Never used  Substance Use Topics  . Alcohol use: Yes    Comment: occasionally  . Drug use: No     Colonoscopy:  PAP:  Bone density:  Lipid panel:  Allergies  Allergen Reactions  . Oxycodone Other (See Comments)    Can't sleep, feels bad when taking.    Current Outpatient Medications  Medication Sig Dispense Refill  . acetaminophen (TYLENOL) 500 MG tablet Take 1,000 mg by mouth every 6 (six) hours as needed (for pain.).    Marland Kitchen Calcium Carb-Cholecalciferol (CALCIUM 600+D) 600-800 MG-UNIT TABS Take 1 tablet by mouth every morning.     . calcium carbonate (TUMS EX) 750 MG chewable tablet Chew 1-2 tablets by mouth 3 (three) times daily as needed (for heartburn/indigestion).    . cholecalciferol (VITAMIN D3) 25 MCG (1000 UNIT) tablet Take 1,000 Units by mouth daily.    . magnesium oxide (MAG-OX) 400 MG tablet Take 250 mg by mouth daily.     . Multiple Vitamins-Minerals (CENTRUM SILVER 50+WOMEN) TABS Take 1 tablet by mouth daily.    . Potassium 99 MG TABS Take 99 mg by mouth daily.     . vitamin C (ASCORBIC ACID) 500 MG tablet Take 500 mg by mouth daily.     No current facility-administered medications for this visit.    OBJECTIVE: There  were no vitals filed for this visit.   There is no height or weight on file to calculate BMI.    ECOG FS:1 - Symptomatic but completely ambulatory  General: Well-developed, well-nourished, no acute distress. HEENT: Normocephalic.  Right cervical lymphadenopathy noted. Neuro: Alert, answering all questions appropriately. Cranial nerves grossly intact. Psych: Normal affect.   LAB RESULTS:  Lab Results  Component Value Date   NA 138 11/12/2017   K 4.1 11/12/2017   CL 106 11/12/2017   CO2 22 11/12/2017   GLUCOSE 169 (H) 11/12/2017   BUN 18 11/12/2017   CREATININE 0.86 11/12/2017   CALCIUM 9.3 11/12/2017   PROT 7.2 11/12/2017   ALBUMIN 4.4 11/12/2017   AST 29 11/12/2017   ALT 22 11/12/2017   ALKPHOS 69 11/12/2017   BILITOT 0.5 11/12/2017   GFRNONAA >60 11/12/2017   GFRAA >60 11/12/2017  Lab Results  Component Value Date   WBC 70.4 (HH) 08/09/2020   NEUTROABS 5.4 08/09/2020   HGB 11.3 (L) 08/09/2020   HCT 34.4 (L) 08/09/2020   MCV 88.9 08/09/2020   PLT 147 (L) 08/09/2020     STUDIES: DG Chest 2 View  Result Date: 08/07/2020 CLINICAL DATA:  86 year old female with nonproductive cough. Previous breast cancer. EXAM: CHEST - 2 VIEW COMPARISON:  Chest radiographs 09/27/2019. FINDINGS: New abnormal right paratracheal density since last year. Other mediastinal contours are stable and within normal limits. Tracheal air column remains within normal limits. Stable lungs, including probable subpleural scarring on the right in the middle lobe. No pneumothorax, pulmonary edema, pleural effusion or new pulmonary opacity. Osteopenia. No acute osseous abnormality identified. Abdominal Calcified aortic atherosclerosis. Negative visible bowel gas pattern. IMPRESSION: 1. Increased right paratracheal density in the superior mediastinum, nonspecific but suspicious for mass or lymphadenopathy. Recommend follow-up Chest CT (IV contrast preferred). 2. Elsewhere stable radiographic appearance of the  chest. These results will be called to the ordering clinician or representative by the Radiologist Assistant, and communication documented in the PACS or [employer]. Electronically Signed   By: Genevie Ann M.D.   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  CLL.  PLAN:    1. 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 This was likely a second primary.  Patient completed 4 cycles of Taxotere and Cytoxan August 06, 2017.  Also completed adjuvant XRT.  An aromatase inhibitor would not be of any benefit given the ER/PR status of her tumor.  Patient's most recent mammogram on May 09, 2020 was reported as BI-RADS 4, but she was instructed by her surgeon to wait 6 months for consideration of a biopsy.  2. 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 Oncotype score 17: Originally diagnosed in 2008. Patient completed 5 years of Arimidex in approximately August 2013. Biopsy of the right breast only revealed fat necrosis at the site of her previous MammoSite.  Mammogram and possible biopsy as above. 3. Genetic testing: Patient has a sister and mother both with breast cancer. 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 4.  Back pain: Chronic and unchanged. 5.  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  She also has visible lymphadenopathy in her right neck.  Recent chest x-ray also has suggested mediastinal lymphadenopathy.  Will get CT scan of the neck, chest, abdomen, and pelvis in the next week.  Given patient's progression of disease she will require treatment and have  recommended oral Imbruvica.  Return to clinic in 1 week for discussion of her imaging results and treatment planning.  Patient will also have consultation with clinical pharmacist at that time.    I provided 30 minutes of face-to-face video visit time during this encounter which included chart review, counseling, and coordination of care as documented above.    Patient expressed understanding and was in agreement with this plan. She also understands that She can call clinic at any time with any questions, concerns, or complaints.   Cancer Staging Carcinoma of upper-outer quadrant of left  breast in female, estrogen receptor negative ([place]) Staging form: Breast, AJCC 8th Edition - Clinical stage from 04/08/2017: Stage IB (cT1c, cN0, cM0, G3, ER: Negative, PR: Negative, HER2: Negative) - Signed by Lloyd Huger, MD on 04/08/2017 - Pathologic stage from 05/11/2017: Stage IB (pT1c, pN0, cM0, G3, ER: Negative, PR: Negative, HER2: Negative) - Signed by Lloyd Huger, MD on 05/11/2017   Lloyd Huger, MD   08/11/2020 10:41 AM

## 2020-08-13 ENCOUNTER — Emergency Department: Payer: Medicare HMO

## 2020-08-13 ENCOUNTER — Other Ambulatory Visit: Payer: Self-pay

## 2020-08-13 ENCOUNTER — Encounter: Payer: Self-pay | Admitting: Emergency Medicine

## 2020-08-13 ENCOUNTER — Telehealth: Payer: Self-pay | Admitting: Internal Medicine

## 2020-08-13 ENCOUNTER — Emergency Department
Admission: EM | Admit: 2020-08-13 | Discharge: 2020-08-13 | Disposition: A | Discharge status: Home / Self Care | Payer: Medicare HMO | Attending: Emergency Medicine | Admitting: Emergency Medicine

## 2020-08-13 ENCOUNTER — Other Ambulatory Visit: Payer: Self-pay | Admitting: *Deleted

## 2020-08-13 ENCOUNTER — Telehealth: Payer: Self-pay | Admitting: Pharmacy Technician

## 2020-08-13 ENCOUNTER — Telehealth: Payer: Self-pay | Admitting: Pharmacist

## 2020-08-13 DIAGNOSIS — C9110 Chronic lymphocytic leukemia of B-cell type not having achieved remission: Secondary | ICD-10-CM | POA: Insufficient documentation

## 2020-08-13 DIAGNOSIS — Z87891 Personal history of nicotine dependence: Secondary | ICD-10-CM | POA: Insufficient documentation

## 2020-08-13 DIAGNOSIS — R161 Splenomegaly, not elsewhere classified: Secondary | ICD-10-CM | POA: Diagnosis not present

## 2020-08-13 DIAGNOSIS — Z923 Personal history of irradiation: Secondary | ICD-10-CM | POA: Diagnosis not present

## 2020-08-13 DIAGNOSIS — Z9221 Personal history of antineoplastic chemotherapy: Secondary | ICD-10-CM | POA: Diagnosis not present

## 2020-08-13 DIAGNOSIS — R05 Cough: Secondary | ICD-10-CM | POA: Diagnosis not present

## 2020-08-13 DIAGNOSIS — R1012 Left upper quadrant pain: Secondary | ICD-10-CM | POA: Diagnosis not present

## 2020-08-13 DIAGNOSIS — K5900 Constipation, unspecified: Secondary | ICD-10-CM | POA: Insufficient documentation

## 2020-08-13 DIAGNOSIS — K5730 Diverticulosis of large intestine without perforation or abscess without bleeding: Secondary | ICD-10-CM | POA: Diagnosis not present

## 2020-08-13 DIAGNOSIS — Z853 Personal history of malignant neoplasm of breast: Secondary | ICD-10-CM | POA: Insufficient documentation

## 2020-08-13 DIAGNOSIS — I700 Atherosclerosis of aorta: Secondary | ICD-10-CM | POA: Diagnosis not present

## 2020-08-13 DIAGNOSIS — I864 Gastric varices: Secondary | ICD-10-CM | POA: Insufficient documentation

## 2020-08-13 DIAGNOSIS — R109 Unspecified abdominal pain: Secondary | ICD-10-CM

## 2020-08-13 DIAGNOSIS — Z803 Family history of malignant neoplasm of breast: Secondary | ICD-10-CM | POA: Insufficient documentation

## 2020-08-13 DIAGNOSIS — Z79899 Other long term (current) drug therapy: Secondary | ICD-10-CM | POA: Insufficient documentation

## 2020-08-13 DIAGNOSIS — K429 Umbilical hernia without obstruction or gangrene: Secondary | ICD-10-CM | POA: Insufficient documentation

## 2020-08-13 LAB — BASIC METABOLIC PANEL
Anion gap: 12 (ref 5–15)
BUN: 28 mg/dL — ABNORMAL HIGH (ref 8–23)
CO2: 20 mmol/L — ABNORMAL LOW (ref 22–32)
Calcium: 9 mg/dL (ref 8.9–10.3)
Chloride: 108 mmol/L (ref 98–111)
Creatinine, Ser: 1.23 mg/dL — ABNORMAL HIGH (ref 0.44–1.00)
GFR calc Af Amer: 47 mL/min — ABNORMAL LOW (ref >60)
GFR calc non Af Amer: 41 mL/min — ABNORMAL LOW (ref >60)
Glucose, Bld: 131 mg/dL — ABNORMAL HIGH (ref 70–99)
Potassium: 4.6 mmol/L (ref 3.5–5.1)
Sodium: 140 mmol/L (ref 135–145)

## 2020-08-13 LAB — CBC
HCT: 34.1 % — ABNORMAL LOW (ref 36.0–46.0)
Hemoglobin: 11.2 g/dL — ABNORMAL LOW (ref 12.0–15.0)
MCH: 29.5 pg (ref 26.0–34.0)
MCHC: 32.8 g/dL (ref 30.0–36.0)
MCV: 89.7 fL (ref 80.0–100.0)
Platelets: 141 10*3/uL — ABNORMAL LOW (ref 150–400)
RBC: 3.8 MIL/uL — ABNORMAL LOW (ref 3.87–5.11)
RDW: 14.6 % (ref 11.5–15.5)
WBC: 63.8 10*3/uL (ref 4.0–10.5)
nRBC: 0 % (ref 0.0–0.2)

## 2020-08-13 LAB — LACTIC ACID, PLASMA: Lactic Acid, Venous: 1.5 mmol/L (ref 0.5–1.9)

## 2020-08-13 LAB — TROPONIN I (HIGH SENSITIVITY)
Troponin I (High Sensitivity): 9 ng/L (ref <18)
Troponin I (High Sensitivity): 8 ng/L (ref <18)

## 2020-08-13 LAB — MAGNESIUM: Magnesium: 2.5 mg/dL — ABNORMAL HIGH (ref 1.7–2.4)

## 2020-08-13 LAB — PROCALCITONIN: Procalcitonin: 0.24 ng/mL

## 2020-08-13 MED ORDER — IOHEXOL 300 MG/ML  SOLN
75.0000 mL | Freq: Once | INTRAMUSCULAR | Status: AC | PRN
  Administered 2020-08-13: 75 mL via INTRAVENOUS

## 2020-08-13 MED ORDER — OXYCODONE HCL 5 MG PO TABS: 5.0000 mg | ORAL_TABLET | Freq: Once | ORAL | Status: DC

## 2020-08-13 MED ORDER — LACTATED RINGERS IV BOLUS
1000.0000 mL | Freq: Once | INTRAVENOUS | Status: AC
  Administered 2020-08-13: 1000 mL via INTRAVENOUS

## 2020-08-13 MED ORDER — ACETAMINOPHEN 500 MG PO TABS
1000.0000 mg | ORAL_TABLET | Freq: Once | ORAL | Status: AC
  Administered 2020-08-13: 1000 mg via ORAL
  Filled 2020-08-13: qty 2

## 2020-08-13 MED ORDER — FENTANYL CITRATE (PF) 100 MCG/2ML IJ SOLN
50.0000 ug | Freq: Once | INTRAMUSCULAR | Status: AC
  Administered 2020-08-13: 50 ug via INTRAVENOUS
  Filled 2020-08-13: qty 2

## 2020-08-13 NOTE — ED Notes (Signed)
[place]bow sent to the lab.  

## 2020-08-13 NOTE — Telephone Encounter (Signed)
Oral Oncology Patient Advocate Encounter  Prior Authorization for Kate Sable has been approved.    PA# [id number] Effective dates: 12/16/19 through 12/14/20  Patients co-pay is $2968.79  Oral Oncology Clinic will continue to follow.   [place] Patient [place] Phone [phone] Fax [phone] 08/13/2020 3:43 PM

## 2020-08-13 NOTE — Telephone Encounter (Signed)
Oral Oncology Patient Advocate Encounter  Received notification from [place] that prior authorization for Imbruvica is required.  PA submitted on CoverMyMeds Key [id number] Status is pending  Oral Oncology Clinic will continue to follow.  [place] Patient [place] Phone [phone] Fax [phone] 08/13/2020 3:38 PM

## 2020-08-13 NOTE — ED Notes (Signed)
Pt c/o left flank pain since Friday; states she may have hurt something when she coughed. Pt states her urine smells foul and that she also has some incontinence. Pt alert & oriented, nad noted.

## 2020-08-13 NOTE — ED Notes (Signed)
Signature pad not working, pt verbalizes understanding of d/c instructions and when to return to ed. Denies questions or concerns at this time

## 2020-08-13 NOTE — Telephone Encounter (Signed)
Discussed with the ER doctor-patient symptoms likely from massive splenomegaly based on imaging.  Patient getting IV fluids; mild renal insufficiency creatinine 1.2 baseline 0.8.  Patient potentially could be discharged.  will discuss with Dr. Grayland Ormond; and plan follow-up.

## 2020-08-13 NOTE — ED Provider Notes (Signed)
[facility] Emergency Department Provider Note  ____________________________________________   First MD Initiated Contact with Patient 08/13/20 1400     (approximate)  I have reviewed the triage vital signs and the nursing notes.   HISTORY  Chief Complaint Flank Pain   HPI Megan Santana is a 86 y.o. female with a past medical history of CLL currently under surveillance not undergoing chemotherapy, arthritis, breast cancer status post right lumpectomy and bilateral radiation therapy, GERD, arthritis, and obesity who presents for assessment of several days of cough associated with acute onset of left-sided flank pain that began on 8/27.  Patient endorses several days of constipation which is unusual for her.  She denies any recent falls or injuries.  Denies any fevers, chills, chest pain, shortness of breath, vomiting, nausea, extremity pain, rash, urinary symptoms, or other acute complaints.  No prior similar episodes.  No alleviating aggravating factors.         Past Medical History:  Diagnosis Date  . Arthritis   . Breast cancer [facility]) 2007   right lumpectomy  . Breast cancer ([place]) 03/31/2017   16 mm invasive mammary carcinoma, left upper outer quadrant, T1c, N0, triple negative, histologic grade 3.  . Family history of adverse reaction to anesthesia    mom n/v  . GERD (gastroesophageal reflux disease)   . Heart murmur   . Lumbar radiculitis   . Lumbar stenosis   . Personal history of chemotherapy 2018   Left  . Personal history of radiation therapy 2007   Right  . Personal history of radiation therapy 2018   Left    Patient Active Problem List   Diagnosis Date Noted  . CLL (chronic lymphocytic leukemia) ([place]) 02/10/2020  . Goals of care, counseling/discussion 05/11/2017  . Carcinoma of upper-outer quadrant of left breast in female, estrogen receptor negative ([place]) 04/08/2017    Past Surgical History:  Procedure Laterality Date  .  ABDOMINAL HYSTERECTOMY     total  . BACK SURGERY     lumbar  . BREAST BIOPSY Bilateral 03/31/2017   bilat u/s core INVASIVE MAMMARY CARCINOMA  . BREAST BIOPSY Right 03/31/2017   DENSE FIBROSIS AND SHEETS OF MACROPHAGES  . BREAST EXCISIONAL BIOPSY Left 2018  . BREAST LUMPECTOMY Right 2007  . BREAST LUMPECTOMY Left 04/23/2017  . BREAST LUMPECTOMY Left 04/23/2017   Procedure: BREAST LUMPECTOMY WITH EXCISION OF SENTINEL NODE;  Surgeon: Robert Bellow, MD;  Whole breast radiation ORS;  Service: General;  Laterality: Left;  . BREAST SURGERY Right    Wide excision  . EYE SURGERY     cataracts bil  . FRACTURE SURGERY Left 2015 or 2016  . TUMOR EXCISION  2001    Prior to Admission medications   Medication Sig Start Date End Date Taking? Authorizing Provider  acetaminophen (TYLENOL) 500 MG tablet Take 1,000 mg by mouth every 6 (six) hours as needed (for pain.).   Yes [provider]  Calcium Carb-Cholecalciferol (CALCIUM 600+D) 600-800 MG-UNIT TABS Take 1 tablet by mouth every morning.    Yes [provider]  calcium carbonate (TUMS EX) 750 MG chewable tablet Chew 1-2 tablets by mouth 3 (three) times daily as needed (for heartburn/indigestion).   Yes [provider]  cholecalciferol (VITAMIN D3) 25 MCG (1000 UNIT) tablet Take 1,000 Units by mouth daily.   Yes [provider]  magnesium oxide (MAG-OX) 400 MG tablet Take 250 mg by mouth daily.    Yes [provider]  Multiple Vitamins-Minerals (  CENTRUM SILVER 50+WOMEN) TABS Take 1 tablet by mouth daily.   Yes [provider]  Potassium 99 MG TABS Take 99 mg by mouth daily.    Yes [provider]  vitamin C (ASCORBIC ACID) 500 MG tablet Take 500 mg by mouth daily.   Yes [provider]    Allergies Oxycodone  Family History  Problem Relation Age of Onset  . Breast cancer Sister 70  . Lung cancer Mother 88       mets to breast  . Lung cancer Father 78  . Heart  attack Brother   . Breast cancer Maternal Aunt        age unknown  . Stroke Sister 3    Social History Social History   Tobacco Use  . Smoking status: Former Smoker    Packs/day: 0.25    Years: 10.00    Pack years: 2.50    Types: Cigarettes    Quit date: 1970    Years since quitting: 51.6  . Smokeless tobacco: Never Used  Vaping Use  . Vaping Use: Never used  Substance Use Topics  . Alcohol use: Yes    Comment: occasionally  . Drug use: No    Review of Systems  Review of Systems  Constitutional: Negative for chills and fever.  HENT: Negative for sore throat.   Eyes: Negative for pain.  Respiratory: Positive for cough. Negative for stridor.   Cardiovascular: Negative for chest pain.  Gastrointestinal: Negative for vomiting.  Genitourinary: Positive for flank pain.  Skin: Negative for rash.  Neurological: Negative for seizures, loss of consciousness and headaches.  Psychiatric/Behavioral: Negative for suicidal ideas.  All other systems reviewed and are negative.     ____________________________________________   PHYSICAL EXAM:  VITAL SIGNS: ED Triage Vitals  Enc Vitals Group     BP 08/13/20 0946 (!) 146/83     Pulse Rate 08/13/20 0946 87     Resp 08/13/20 0946 18     Temp 08/13/20 0946 98.5 F (36.9 C)     Temp Source 08/13/20 0946 Oral     SpO2 08/13/20 0946 97 %     Weight 08/13/20 0946 165 lb (74.8 kg)     Height 08/13/20 0946 5\' 2"  (1.575 m)     Head Circumference --      Peak Flow --      Pain Score 08/13/20 1002 8     Pain Loc --      Pain Edu? --      Excl. in [place]? --    Vitals:   08/13/20 0946 08/13/20 1530  BP: (!) 146/83 (!) 156/80  Pulse: 87 68  Resp: 18 16  Temp: 98.5 F (36.9 C)   SpO2: 97% 96%   Physical Exam Vitals and nursing note reviewed.  Constitutional:      General: She is not in acute distress.    Appearance: She is well-developed. She is obese.  HENT:     Head: Normocephalic and atraumatic.     Right Ear: External  ear normal.     Left Ear: External ear normal.     Nose: Nose normal.  Eyes:     Conjunctiva/sclera: Conjunctivae normal.  Cardiovascular:     Rate and Rhythm: Normal rate and regular rhythm.     Heart sounds: No murmur heard.   Pulmonary:     Effort: Pulmonary effort is normal. No respiratory distress.     Breath sounds: Normal breath sounds.  Abdominal:  Palpations: Abdomen is soft. There is splenomegaly.     Tenderness: There is abdominal tenderness in the left upper quadrant.  Musculoskeletal:     Cervical back: Neck supple. No rigidity.     Right lower leg: No edema.     Left lower leg: No edema.  Skin:    General: Skin is warm and dry.     Capillary Refill: Capillary refill takes less than 2 seconds.  Neurological:     Mental Status: She is alert and oriented to person, place, and time.  Psychiatric:        Mood and Affect: Mood normal.     Tenderness palpation over left flank and left lower mid axillary ribs. ____________________________________________   LABS (all labs ordered are listed, but only abnormal results are displayed)  Labs Reviewed  BASIC METABOLIC PANEL - Abnormal; Notable for the following components:      Result Value   CO2 20 (*)    Glucose, Bld 131 (*)    BUN 28 (*)    Creatinine, Ser 1.23 (*)    GFR calc non Af Amer 41 (*)    GFR calc Af Amer 47 (*)    All other components within normal limits  CBC - Abnormal; Notable for the following components:   WBC 63.8 (*)    RBC 3.80 (*)    Hemoglobin 11.2 (*)    HCT 34.1 (*)    Platelets 141 (*)    All other components within normal limits  MAGNESIUM - Abnormal; Notable for the following components:   Magnesium 2.5 (*)    All other components within normal limits  PROCALCITONIN  LACTIC ACID, PLASMA  TROPONIN I (HIGH SENSITIVITY)  TROPONIN I (HIGH SENSITIVITY)   ____________________________________________  EKG  Sinus rhythm with ventricular rate of 95, normal axis, unremarkable  intervals, no evidence of acute ischemia or other clear underlying rhythm. ____________________________________________  RADIOLOGY  ED MD interpretation: Significant adenopathy.  No clear focal consolidation, pneumothorax, or significant effusion.  Official radiology report(s): DG Chest 2 View  Result Date: 08/13/2020 CLINICAL DATA:  Left chest pain. History of breast cancer and leukemia EXAM: CHEST - 2 VIEW COMPARISON:  08/07/2020 FINDINGS: Right paratracheal adenopathy unchanged from the prior study. No other adenopathy Heart size normal.  Vascularity normal.  Atherosclerotic aortic arch Decreased lung volume with bibasilar atelectasis which has developed in the interval. No effusion. IMPRESSION: Right paratracheal adenopathy is unchanged. CT chest with contrast recommended for further evaluation. Hypoventilation with bibasilar atelectasis. Electronically Signed   By: Franchot Gallo M.D.   On: 08/13/2020 10:50   CT CHEST ABDOMEN PELVIS W CONTRAST  Result Date: 08/13/2020 CLINICAL DATA:  Left flank and upper abdominal pain, beneath left breast since Friday. History of breast cancer and hysterectomy, history of CLL EXAM: CT CHEST, ABDOMEN, AND PELVIS WITH CONTRAST TECHNIQUE: Multidetector CT imaging of the chest, abdomen and pelvis was performed following the standard protocol during bolus administration of intravenous contrast. CONTRAST:  84mL OMNIPAQUE IOHEXOL 300 MG/ML  SOLN COMPARISON:  CT abdomen pelvis 01/06/2008, chest radiograph 08/13/2020 FINDINGS: CT CHEST FINDINGS Cardiovascular: Cardiac size within normal limits. Trace pericardial fluid, likely upper limits normal. Three-vessel coronary artery calcifications. Calcifications of the mitral annulus and aortic leaflets. Central pulmonary arteries are normal caliber. No large central or lobar pulmonary artery emboli on this non tailored examination. Atherosclerotic plaque within the normal caliber aorta. Normal 3 vessel branching of the aortic  arch. Mild tortuosity of brachiocephalic vessels partially displaced by the mediastinal  adenopathy/mass. Mediastinum/Nodes: There is extensive mediastinal and hilar adenopathy as well as a larger middle mediastinal soft tissue density/paratracheal mass measuring up to 4.8 by 5.1 by 5.1 cm in maximum AP by transverse by craniocaudal dimensions. Several larger nodes for reference include: Subpectoral left axillary node measuring up to 1.8 cm short axis (2/6) Subpectoral right axillary node measuring up to 2.3 cm short axis (2/14) AP window node measuring up to 11 mm (2/21) Minimal secretions in the trachea. No significant narrowing, occlusions or other acute abnormality. Mild lateral displacement of the esophagus by the mediastinal tissue. Small sliding-type hiatal hernia. Normal thyroid. Lungs/Pleura: Some reticular bandlike opacities in the lung bases likely reflecting a combination of scarring and architectural distortion as well as subsegmental atelectatic change. Dependent atelectasis posteriorly. No concerning pulmonary nodules or masses. Mild biapical pleuroparenchymal scarring. No consolidation, features of edema, pneumothorax, or effusion. Musculoskeletal: Likely postsurgical changes from prior lumpectomy with a rounded intermediate attenuation structure measuring approximately 3.5 x 3.6 cm in the right breast (2/34) along the chest wall with surrounding coarse calcification possibly reflecting sequela of prior fat necrosis. Fairly circumscribed region of ground-glass density within the left breast measuring approximately 2.8 x 1.8 cm in size (2/26) could reflect prior fat necrosis as well. CT ABDOMEN PELVIS FINDINGS Hepatobiliary: No worrisome focal liver abnormality is seen. Normal gallbladder. No visible calcified gallstones. No biliary ductal dilatation. Pancreas: Unremarkable. No pancreatic ductal dilatation or surrounding inflammatory changes. Spleen: Marked splenomegaly. Minimal stranding towards the  splenic hilum, nonspecific. Adrenals/Urinary Tract: No concerning adrenal lesions. Kidneys enhance and excrete symmetrically. Numerous subcentimeter hypoattenuating foci in both kidneys, statistically likely benign though should be viewed with some suspicion followed on comparison imaging given the patient's history of malignancy. Mild bilateral perinephric stranding is chronic and similar to comparison. No urolithiasis or hydronephrosis. Urinary bladder is largely decompressed at the time of exam and therefore poorly evaluated by CT imaging. No gross bladder abnormality. Stomach/Bowel: Sliding-type hiatal hernia, as above. Serpiginous hyperenhancement along the greater curvature with numerous vascular collaterals, could reflect gastric varices. Which could be better evaluated with endoscopy. Duodenum is unremarkable. No small bowel thickening or dilatation. Portion of the transverse colon protrudes into a fat and bowel containing umbilical hernia with a 2.9 by 2.9 cm fascial defect and no convincing evidence of obstruction or strangulation at this time. No resulting mechanical obstruction. Scattered colonic diverticula without focal inflammation to suggest diverticulitis. No discernible colonic dilatation or significant wall thickening. Vascular/Lymphatic: Upper abdominal venous collaterals, as above. Atherosclerotic calcifications within the abdominal aorta and branch vessels. No aneurysm or ectasia. Multiple enlarged nodes are seen in the abdomen and pelvis. These include: Aortocaval 14 mm node (2/64) Portacaval 18 mm node (2/62) Prominent inguinal nodes including a 13 mm right inguinal lymph node (2/106) Conspicuous though subcentimeter midmesenteric nodes but which are enlarged from comparison studies. Reproductive: Uterus is surgically absent. No concerning adnexal lesions. Other: Trace amount of thickening adjacent the enlarged spleen, nonspecific. No abdominopelvic free air or fluid. Fat and large bowel  containing hernia without strangulation or obstruction, as above. Musculoskeletal: Scoliotic curvature of the thoracolumbar spine including a levocurvature apex T10 and a dextrocurvature apex L3. Multilevel degenerative changes are present in the imaged portions of the spine. Additional degenerative changes in the hips and pelvis. No acute or suspicious osseous lesions. IMPRESSION: 1. Extensive mediastinal, hilar, and axillary adenopathy as well as a larger middle mediastinal soft tissue conglomerate nodal mass measuring up to 4.8 cm. Findings are compatible with history of CLL.  Correlate with laboratory findings. 2. Marked splenomegaly measuring up to 17 cm in length. Faint surrounding stranding is nonspecific but could be indicative of some splenic congestion in the absence of traumatic findings or history. 3. Upper abdominal venous collateralization including gastric varices. Consider direct visualization on an outpatient basis. 4. Likely postsurgical changes of the right breast. Circumscribed fat in the left breast could reflect developing fat necrosis the appearance on comparison mammography (05/09/2020) indicated a suspicious appearance of the associated calcifications and further biopsy was recommended. 5. Fat and large bowel containing umbilical hernia without convincing evidence of obstruction or strangulation at this time. 6. Numerous subcentimeter hypoattenuating foci in both kidneys, statistically likely benign though should be viewed with some suspicion followed on comparison imaging given the patient's history of malignancy. 7. Colonic diverticulosis without evidence of diverticulitis. 8. Scoliotic curvature of the thoracolumbar spine. 9. Aortic Atherosclerosis (ICD10-I70.0). These results were called by telephone at the time of interpretation on 08/13/2020 at 3:49 pm to provider [facility] , who verbally acknowledged these results. Electronically Signed   By: Lovena Le M.D.   On: 08/13/2020 15:49     ____________________________________________   PROCEDURES  Procedure(s) performed (including Critical Care):  Procedures   ____________________________________________   INITIAL IMPRESSION / ASSESSMENT AND PLAN / ED COURSE        Overall patient's history, exam, a work-up is concerning for left upper quadrant and the flank pain secondary to significant splenomegaly likely secondary to progression of her known CLL.  Patient does have some incidental findings including umbilical hernia that shows no evidence of obstruction or strangulation below suspicion that this is causing patient symptoms.  Patient's white blood cell count is quite elevated today but I have low suspicion for sepsis or other acute infectious process.  No evidence of pneumonia, intra-abdominal abscess, or other acute process on CT chest abdomen pelvis.  BMP shows slightly elevated creatinine consistent with mild dehydration but no other significant derangements.  Reassuring EKG and do not believe troponins are not consistent with ACS.  I did discuss patient's presentation and work-up with on-call oncologist who agreed to see the patient at 815 tomorrow morning at the cancer care clinic.  Given stable vital signs with otherwise reassuring exam and work-up I do believe this is reasonable as patient did states she felt much better after below noted analgesia.  Discharged stable condition.  Return precautions advised and discussed.  I also spoke with the patient's son and updated him on patient's plan of care and he was amenable to taking her to clinic tomorrow morning.  Medications  oxyCODONE (Oxy IR/ROXICODONE) immediate release tablet 5 mg (5 mg Oral Not Given 08/13/20 1607)  fentaNYL (SUBLIMAZE) injection 50 mcg (50 mcg Intravenous Given 08/13/20 1436)  iohexol (OMNIPAQUE) 300 MG/ML solution 75 mL (75 mLs Intravenous Contrast Given 08/13/20 1459)  lactated ringers bolus 1,000 mL (1,000 mLs Intravenous New Bag/Given 08/13/20  1533)  acetaminophen (TYLENOL) tablet 1,000 mg (1,000 mg Oral Given 08/13/20 1546)  ___________________________________   FINAL CLINICAL IMPRESSION(S) / ED DIAGNOSES  Final diagnoses:  Flank pain  CLL (chronic lymphocytic leukemia) ([facility])  Splenomegaly    Medications  oxyCODONE (Oxy IR/ROXICODONE) immediate release tablet 5 mg (5 mg Oral Not Given 08/13/20 1607)  fentaNYL (SUBLIMAZE) injection 50 mcg (50 mcg Intravenous Given 08/13/20 1436)  iohexol (OMNIPAQUE) 300 MG/ML solution 75 mL (75 mLs Intravenous Contrast Given 08/13/20 1459)  lactated ringers bolus 1,000 mL (1,000 mLs Intravenous New Bag/Given 08/13/20 1533)  acetaminophen (TYLENOL) tablet  1,000 mg (1,000 mg Oral Given 08/13/20 1546)     ED Discharge Orders    None       Note:  This document was prepared using Dragon voice recognition software and may include unintentional dictation errors.   Lucrezia Starch, MD 08/13/20 [phone]

## 2020-08-13 NOTE — Telephone Encounter (Signed)
Oral Oncology Pharmacist Encounter  Received new prescription for Imbruvica (ibrutinib) for the treatment of CLL with 13q deletion, planned duration until disease progression or unacceptable drug toxicity.  CBC from 08/09/20 assessed, ALC is continuing to increase. CMP lab order entered for liver function assessment.   Current medication list in Epic reviewed, no DDIs with ibrutinib identified.   Evaluated chart and no patient barriers to medication adherence identified.   Oral Oncology Clinic will continue to follow for insurance authorization, copayment issues, initial counseling and start date.  Darl Pikes, PharmD, BCPS, BCOP, CPP Hematology/Oncology Clinical Pharmacist Practitioner [facility] [phone]  08/13/2020 3:06 PM

## 2020-08-13 NOTE — ED Triage Notes (Signed)
Pt reports pain to her left side, flank, upper abd, under left breast since Friday. Pt reports pain is constant but gets worse sometimes.

## 2020-08-14 ENCOUNTER — Encounter: Payer: Self-pay | Admitting: Oncology

## 2020-08-14 ENCOUNTER — Inpatient Hospital Stay (HOSPITAL_BASED_OUTPATIENT_CLINIC_OR_DEPARTMENT_OTHER): Payer: Medicare HMO | Admitting: Oncology

## 2020-08-14 ENCOUNTER — Ambulatory Visit
Admission: RE | Admit: 2020-08-14 | Discharge: 2020-08-14 | Disposition: A | Discharge status: Home / Self Care | Payer: Medicare HMO | Source: Ambulatory Visit | Attending: Oncology | Admitting: Oncology

## 2020-08-14 ENCOUNTER — Inpatient Hospital Stay: Payer: Medicare HMO

## 2020-08-14 ENCOUNTER — Ambulatory Visit: Admission: RE | Admit: 2020-08-14 | Payer: Medicare HMO | Source: Ambulatory Visit

## 2020-08-14 VITALS — BP 148/76 | HR 80 | Temp 98.6°F | Wt 168.0 lb

## 2020-08-14 DIAGNOSIS — C50412 Malignant neoplasm of upper-outer quadrant of left female breast: Secondary | ICD-10-CM

## 2020-08-14 DIAGNOSIS — C9110 Chronic lymphocytic leukemia of B-cell type not having achieved remission: Secondary | ICD-10-CM

## 2020-08-14 DIAGNOSIS — Z171 Estrogen receptor negative status [ER-]: Secondary | ICD-10-CM | POA: Insufficient documentation

## 2020-08-14 LAB — COMPREHENSIVE METABOLIC PANEL
ALT: 25 U/L (ref 0–44)
AST: 27 U/L (ref 15–41)
Albumin: 3.8 g/dL (ref 3.5–5.0)
Alkaline Phosphatase: 83 U/L (ref 38–126)
Anion gap: 10 (ref 5–15)
BUN: 28 mg/dL — ABNORMAL HIGH (ref 8–23)
CO2: 19 mmol/L — ABNORMAL LOW (ref 22–32)
Calcium: 9.1 mg/dL (ref 8.9–10.3)
Chloride: 110 mmol/L (ref 98–111)
Creatinine, Ser: 1.24 mg/dL — ABNORMAL HIGH (ref 0.44–1.00)
GFR calc Af Amer: 47 mL/min — ABNORMAL LOW (ref >60)
GFR calc non Af Amer: 40 mL/min — ABNORMAL LOW (ref >60)
Glucose, Bld: 148 mg/dL — ABNORMAL HIGH (ref 70–99)
Potassium: 4.4 mmol/L (ref 3.5–5.1)
Sodium: 139 mmol/L (ref 135–145)
Total Bilirubin: 0.3 mg/dL (ref 0.3–1.2)
Total Protein: 7.3 g/dL (ref 6.5–8.1)

## 2020-08-14 LAB — PATHOLOGIST SMEAR REVIEW

## 2020-08-14 MED ORDER — ALLOPURINOL 300 MG PO TABS: 300.0000 mg | ORAL_TABLET | Freq: Every day | ORAL | 1 refills | Status: DC

## 2020-08-14 MED ORDER — CYCLOBENZAPRINE HCL 10 MG PO TABS: 10.0000 mg | ORAL_TABLET | Freq: Three times a day (TID) | ORAL | 0 refills | Status: DC | PRN

## 2020-08-14 MED ORDER — IOHEXOL 300 MG/ML  SOLN
75.0000 mL | Freq: Once | INTRAMUSCULAR | Status: AC | PRN
  Administered 2020-08-14: 75 mL via INTRAVENOUS

## 2020-08-14 NOTE — Progress Notes (Signed)
Patient report she is having pain neck and shoulder. She states she has no appetite and has not had bowel movement since last Thursday. Pain in left side has eased up some. Also reports some dizziness.

## 2020-08-14 NOTE — Progress Notes (Signed)
START OFF PATHWAY REGIMEN - Lymphoma and CLL   [id number]:Rituximab (IV/Subcut) D1 Weekly:   A cycle is every 7 days:     Rituximab-xxxx      Rituximab and hyaluronidase human   **Always confirm dose/schedule in your pharmacy ordering system**  Patient Characteristics: Chronic Lymphocytic Leukemia (CLL), Treatment Indicated, First Line, 17p del(-)/Unknown and ATM Mutation Negative/Unknown and TP53 Mutation Negative/Unknown, Age ? 11 or Frail (Any Age) Disease Type: Chronic Lymphocytic Leukemia (CLL) Disease Type: Not Applicable Disease Type: Not Applicable Treatment Indicated<= Treatment Indicated Line of Therapy: First Line ATM Mutation Status: Negative 17p Deletion Status: Negative TP53 Mutation Status: Negative Patient Age: ? 53 Patient Condition: Fit Patient Intent of Therapy: Non-Curative / Palliative Intent, Discussed with Patient

## 2020-08-14 NOTE — Progress Notes (Signed)
[facility]  [phone] [phone]  ID: Megan Santana OB: 02-12-1934  MR#: [id number]  GYK#:[id number]  Patient Care Team: Lynnell Jude, MD as PCP - General (Family Medicine) Rico Junker, RN as Oncology Nurse Navigator Grayland Ormond, Kathlene November, MD as Consulting Physician (Oncology) Clemmie Krill Lynnell Jude, MD as Referring Physician (Family Medicine) Bary Castilla Forest Gleason, MD (General Surgery) Noreene Filbert, MD as Referring Physician (Radiation Oncology)  I connected with Megan Santana on 08/14/20 at 11:00 AM EDT by video enabled telemedicine visit and verified that I am speaking with the correct person using two identifiers.   I discussed the limitations, risks, security and privacy concerns of performing an evaluation and management service by telemedicine and the availability of in-person appointments. I also discussed with the patient that there may be a patient responsible charge related to this service. The patient expressed understanding and agreed to proceed.   Other persons participating in the visit and their role in the encounter: Patient, MD.  Patient's location: Clinic. Provider's location: Home.   CHIEF COMPLAINT: Rapidly progressive CLL.  INTERVAL HISTORY: Patient agreed to video assisted telemedicine visit as an add-on after being evaluated in the emergency room last night for what appears to be a rapidly progressive CLL.  She continues to have left flank pain as well as musculoskeletal pain in her back.  She also has progressive weakness and fatigue.  She continues to have left thumb pain.  She has no neurologic complaints. She denies any recent fevers or illnesses.  She denies any night sweats or unintentional weight loss.  She denies any chest pain, shortness of breath, cough, or hemoptysis.  She denies any nausea, vomiting, constipation, or diarrhea. She has no urinary complaints.  Patient offers no further specific complaints  today.  REVIEW OF SYSTEMS:   Review of Systems  Constitutional: Positive for malaise/fatigue. Negative for fever and weight loss.  Respiratory: Negative.  Negative for cough, hemoptysis and shortness of breath.   Cardiovascular: Negative.  Negative for chest pain and leg swelling.  Gastrointestinal: Negative.  Negative for abdominal pain and constipation.  Genitourinary: Positive for flank pain. Negative for dysuria.  Musculoskeletal: Positive for back pain and joint pain.  Skin: Negative.  Negative for rash.  Neurological: Positive for weakness. Negative for sensory change, focal weakness and headaches.  Psychiatric/Behavioral: Negative.  The patient is not nervous/anxious and does not have insomnia.     As per HPI. Otherwise, a complete review of systems is negative.  PAST MEDICAL HISTORY: Past Medical History:  Diagnosis Date  . Arthritis   . Breast cancer [facility]) 2007   right lumpectomy  . Breast cancer ([place]) 03/31/2017   16 mm invasive mammary carcinoma, left upper outer quadrant, T1c, N0, triple negative, histologic grade 3.  . Family history of adverse reaction to anesthesia    mom n/v  . GERD (gastroesophageal reflux disease)   . Heart murmur   . Lumbar radiculitis   . Lumbar stenosis   . Personal history of chemotherapy 2018   Left  . Personal history of radiation therapy 2007   Right  . Personal history of radiation therapy 2018   Left    PAST SURGICAL HISTORY: Past Surgical History:  Procedure Laterality Date  . ABDOMINAL HYSTERECTOMY     total  . BACK SURGERY     lumbar  . BREAST BIOPSY Bilateral 03/31/2017   bilat u/s core INVASIVE MAMMARY CARCINOMA  . BREAST BIOPSY Right 03/31/2017   DENSE FIBROSIS  AND SHEETS OF MACROPHAGES  . BREAST EXCISIONAL BIOPSY Left 2018  . BREAST LUMPECTOMY Right 2007  . BREAST LUMPECTOMY Left 04/23/2017  . BREAST LUMPECTOMY Left 04/23/2017   Procedure: BREAST LUMPECTOMY WITH EXCISION OF SENTINEL NODE;  Surgeon: Robert Bellow, MD;  Whole breast radiation ORS;  Service: General;  Laterality: Left;  . BREAST SURGERY Right    Wide excision  . EYE SURGERY     cataracts bil  . FRACTURE SURGERY Left 2015 or 2016  . TUMOR EXCISION  2001    FAMILY HISTORY: Family History  Problem Relation Age of Onset  . Breast cancer Sister 27  . Lung cancer Mother 3       mets to breast  . Lung cancer Father 14  . Heart attack Brother   . Breast cancer Maternal Aunt        age unknown  . Stroke Sister 33    ADVANCED DIRECTIVES (Y/N):  N  HEALTH MAINTENANCE: Social History   Tobacco Use  . Smoking status: Former Smoker    Packs/day: 0.25    Years: 10.00    Pack years: 2.50    Types: Cigarettes    Quit date: 1970    Years since quitting: 51.6  . Smokeless tobacco: Never Used  Vaping Use  . Vaping Use: Never used  Substance Use Topics  . Alcohol use: Yes    Comment: occasionally  . Drug use: No     Colonoscopy:  PAP:  Bone density:  Lipid panel:  Allergies  Allergen Reactions  . Oxycodone Other (See Comments)    Can't sleep, feels bad when taking.    Current Outpatient Medications  Medication Sig Dispense Refill  . acetaminophen (TYLENOL) 500 MG tablet Take 1,000 mg by mouth every 6 (six) hours as needed (for pain.).    Marland Kitchen Calcium Carb-Cholecalciferol (CALCIUM 600+D) 600-800 MG-UNIT TABS Take 1 tablet by mouth every morning.     . calcium carbonate (TUMS EX) 750 MG chewable tablet Chew 1-2 tablets by mouth 3 (three) times daily as needed (for heartburn/indigestion).    . cholecalciferol (VITAMIN D3) 25 MCG (1000 UNIT) tablet Take 1,000 Units by mouth daily.    . magnesium oxide (MAG-OX) 400 MG tablet Take 250 mg by mouth daily.     . Multiple Vitamins-Minerals (CENTRUM SILVER 50+WOMEN) TABS Take 1 tablet by mouth daily.    . Potassium 99 MG TABS Take 99 mg by mouth daily.     . vitamin C (ASCORBIC ACID) 500 MG tablet Take 500 mg by mouth daily.    Marland Kitchen allopurinol (ZYLOPRIM) 300 MG tablet  Take 1 tablet (300 mg total) by mouth daily. 60 tablet 1  . cyclobenzaprine (FLEXERIL) 10 MG tablet Take 1 tablet (10 mg total) by mouth 3 (three) times daily as needed for muscle spasms. 60 tablet 0   No current facility-administered medications for this visit.    OBJECTIVE: Vitals:   08/14/20 1102  BP: (!) 148/76  Pulse: 80  Temp: 98.6 F (37 C)  SpO2: 97%     Body mass index is 30.73 kg/m.    ECOG FS:1 - Symptomatic but completely ambulatory  General: Well-developed, well-nourished, no acute distress.  Sitting in a wheelchair.   HEENT: Normocephalic.  Right cervical lymphadenopathy unchanged. Neuro: Alert, answering all questions appropriately. Cranial nerves grossly intact. Psych: Normal affect.   LAB RESULTS:  Lab Results  Component Value Date   NA 139 08/14/2020   K 4.4 08/14/2020   CL  110 08/14/2020   CO2 19 (L) 08/14/2020   GLUCOSE 148 (H) 08/14/2020   BUN 28 (H) 08/14/2020   CREATININE 1.24 (H) 08/14/2020   CALCIUM 9.1 08/14/2020   PROT 7.3 08/14/2020   ALBUMIN 3.8 08/14/2020   AST 27 08/14/2020   ALT 25 08/14/2020   ALKPHOS 83 08/14/2020   BILITOT 0.3 08/14/2020   GFRNONAA 40 (L) 08/14/2020   GFRAA 47 (L) 08/14/2020    Lab Results  Component Value Date   WBC 72.0 (HH) 08/14/2020   NEUTROABS 7.0 08/14/2020   HGB 11.2 (L) 08/14/2020   HCT 34.4 (L) 08/14/2020   MCV 88.9 08/14/2020   PLT 147 (L) 08/14/2020     STUDIES: DG Chest 2 View  Result Date: 08/13/2020 CLINICAL DATA:  Left chest pain. History of breast cancer and leukemia EXAM: CHEST - 2 VIEW COMPARISON:  08/07/2020 FINDINGS: Right paratracheal adenopathy unchanged from the prior study. No other adenopathy Heart size normal.  Vascularity normal.  Atherosclerotic aortic arch Decreased lung volume with bibasilar atelectasis which has developed in the interval. No effusion. IMPRESSION: Right paratracheal adenopathy is unchanged. CT chest with contrast recommended for further evaluation.  Hypoventilation with bibasilar atelectasis. Electronically Signed   By: Franchot Gallo M.D.   On: 08/13/2020 10:50   DG Chest 2 View  Result Date: 08/07/2020 CLINICAL DATA:  86 year old female with nonproductive cough. Previous breast cancer. EXAM: CHEST - 2 VIEW COMPARISON:  Chest radiographs 09/27/2019. FINDINGS: New abnormal right paratracheal density since last year. Other mediastinal contours are stable and within normal limits. Tracheal air column remains within normal limits. Stable lungs, including probable subpleural scarring on the right in the middle lobe. No pneumothorax, pulmonary edema, pleural effusion or new pulmonary opacity. Osteopenia. No acute osseous abnormality identified. Abdominal Calcified aortic atherosclerosis. Negative visible bowel gas pattern. IMPRESSION: 1. Increased right paratracheal density in the superior mediastinum, nonspecific but suspicious for mass or lymphadenopathy. Recommend follow-up Chest CT (IV contrast preferred). 2. Elsewhere stable radiographic appearance of the chest. These results will be called to the ordering clinician or representative by the Radiologist Assistant, and communication documented in the PACS or [employer]. Electronically Signed   By: Genevie Ann M.D.   On: 08/07/2020 15:59   CT CHEST ABDOMEN PELVIS W CONTRAST  Result Date: 08/13/2020 CLINICAL DATA:  Left flank and upper abdominal pain, beneath left breast since Friday. History of breast cancer and hysterectomy, history of CLL EXAM: CT CHEST, ABDOMEN, AND PELVIS WITH CONTRAST TECHNIQUE: Multidetector CT imaging of the chest, abdomen and pelvis was performed following the standard protocol during bolus administration of intravenous contrast. CONTRAST:  60m OMNIPAQUE IOHEXOL 300 MG/ML  SOLN COMPARISON:  CT abdomen pelvis 01/06/2008, chest radiograph 08/13/2020 FINDINGS: CT CHEST FINDINGS Cardiovascular: Cardiac size within normal limits. Trace pericardial fluid, likely upper limits normal.  Three-vessel coronary artery calcifications. Calcifications of the mitral annulus and aortic leaflets. Central pulmonary arteries are normal caliber. No large central or lobar pulmonary artery emboli on this non tailored examination. Atherosclerotic plaque within the normal caliber aorta. Normal 3 vessel branching of the aortic arch. Mild tortuosity of brachiocephalic vessels partially displaced by the mediastinal adenopathy/mass. Mediastinum/Nodes: There is extensive mediastinal and hilar adenopathy as well as a larger middle mediastinal soft tissue density/paratracheal mass measuring up to 4.8 by 5.1 by 5.1 cm in maximum AP by transverse by craniocaudal dimensions. Several larger nodes for reference include: Subpectoral left axillary node measuring up to 1.8 cm short axis (2/6) Subpectoral right axillary node measuring up  to 2.3 cm short axis (2/14) AP window node measuring up to 11 mm (2/21) Minimal secretions in the trachea. No significant narrowing, occlusions or other acute abnormality. Mild lateral displacement of the esophagus by the mediastinal tissue. Small sliding-type hiatal hernia. Normal thyroid. Lungs/Pleura: Some reticular bandlike opacities in the lung bases likely reflecting a combination of scarring and architectural distortion as well as subsegmental atelectatic change. Dependent atelectasis posteriorly. No concerning pulmonary nodules or masses. Mild biapical pleuroparenchymal scarring. No consolidation, features of edema, pneumothorax, or effusion. Musculoskeletal: Likely postsurgical changes from prior lumpectomy with a rounded intermediate attenuation structure measuring approximately 3.5 x 3.6 cm in the right breast (2/34) along the chest wall with surrounding coarse calcification possibly reflecting sequela of prior fat necrosis. Fairly circumscribed region of ground-glass density within the left breast measuring approximately 2.8 x 1.8 cm in size (2/26) could reflect prior fat necrosis as  well. CT ABDOMEN PELVIS FINDINGS Hepatobiliary: No worrisome focal liver abnormality is seen. Normal gallbladder. No visible calcified gallstones. No biliary ductal dilatation. Pancreas: Unremarkable. No pancreatic ductal dilatation or surrounding inflammatory changes. Spleen: Marked splenomegaly. Minimal stranding towards the splenic hilum, nonspecific. Adrenals/Urinary Tract: No concerning adrenal lesions. Kidneys enhance and excrete symmetrically. Numerous subcentimeter hypoattenuating foci in both kidneys, statistically likely benign though should be viewed with some suspicion followed on comparison imaging given the patient's history of malignancy. Mild bilateral perinephric stranding is chronic and similar to comparison. No urolithiasis or hydronephrosis. Urinary bladder is largely decompressed at the time of exam and therefore poorly evaluated by CT imaging. No gross bladder abnormality. Stomach/Bowel: Sliding-type hiatal hernia, as above. Serpiginous hyperenhancement along the greater curvature with numerous vascular collaterals, could reflect gastric varices. Which could be better evaluated with endoscopy. Duodenum is unremarkable. No small bowel thickening or dilatation. Portion of the transverse colon protrudes into a fat and bowel containing umbilical hernia with a 2.9 by 2.9 cm fascial defect and no convincing evidence of obstruction or strangulation at this time. No resulting mechanical obstruction. Scattered colonic diverticula without focal inflammation to suggest diverticulitis. No discernible colonic dilatation or significant wall thickening. Vascular/Lymphatic: Upper abdominal venous collaterals, as above. Atherosclerotic calcifications within the abdominal aorta and branch vessels. No aneurysm or ectasia. Multiple enlarged nodes are seen in the abdomen and pelvis. These include: Aortocaval 14 mm node (2/64) Portacaval 18 mm node (2/62) Prominent inguinal nodes including a 13 mm right inguinal  lymph node (2/106) Conspicuous though subcentimeter midmesenteric nodes but which are enlarged from comparison studies. Reproductive: Uterus is surgically absent. No concerning adnexal lesions. Other: Trace amount of thickening adjacent the enlarged spleen, nonspecific. No abdominopelvic free air or fluid. Fat and large bowel containing hernia without strangulation or obstruction, as above. Musculoskeletal: Scoliotic curvature of the thoracolumbar spine including a levocurvature apex T10 and a dextrocurvature apex L3. Multilevel degenerative changes are present in the imaged portions of the spine. Additional degenerative changes in the hips and pelvis. No acute or suspicious osseous lesions. IMPRESSION: 1. Extensive mediastinal, hilar, and axillary adenopathy as well as a larger middle mediastinal soft tissue conglomerate nodal mass measuring up to 4.8 cm. Findings are compatible with history of CLL. Correlate with laboratory findings. 2. Marked splenomegaly measuring up to 17 cm in length. Faint surrounding stranding is nonspecific but could be indicative of some splenic congestion in the absence of traumatic findings or history. 3. Upper abdominal venous collateralization including gastric varices. Consider direct visualization on an outpatient basis. 4. Likely postsurgical changes of the right breast. Circumscribed fat in  the left breast could reflect developing fat necrosis the appearance on comparison mammography (05/09/2020) indicated a suspicious appearance of the associated calcifications and further biopsy was recommended. 5. Fat and large bowel containing umbilical hernia without convincing evidence of obstruction or strangulation at this time. 6. Numerous subcentimeter hypoattenuating foci in both kidneys, statistically likely benign though should be viewed with some suspicion followed on comparison imaging given the patient's history of malignancy. 7. Colonic diverticulosis without evidence of  diverticulitis. 8. Scoliotic curvature of the thoracolumbar spine. 9. Aortic Atherosclerosis (ICD10-I70.0). These results were called by telephone at the time of interpretation on 08/13/2020 at 3:49 pm to provider [facility] , who verbally acknowledged these results. Electronically Signed   By: Lovena Le M.D.   On: 08/13/2020 15:49    ASSESSMENT: Rapidly progressive CLL.  PLAN:    1. Rapidly progressive CLL: q13 deletion.  CT scan results reviewed independently and reported as above with significant, widespread lymphadenopathy as well as massive splenomegaly causing flank pain.  Patient's performance status is declining as well.  Her white blood cell count continues to increase and is now 72.0.  Because patient has become symptomatic over the past week, will initiate treatment with IV Rituxan weekly x4 as soon as possible to help control increasing tumor burden.  Patient was also given a prescription for allopurinol to prevent tumor lysis syndrome.  Once patient completes her weekly Rituxan, will transition to oral Imbruvica.  Return to clinic on Friday, August 17, 2020 to initiate cycle 1 of 4 of weekly Rituxan.  2. 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 This was likely a second primary.  Patient completed 4 cycles of Taxotere and Cytoxan August 06, 2017.  Also completed adjuvant XRT.  An aromatase inhibitor would not be of any benefit given the ER/PR status of her tumor.  Patient's most recent mammogram on May 09, 2020 was reported as BI-RADS 4, but she was instructed by her surgeon to wait 6 months for consideration of a biopsy.  3. 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 Oncotype score 17: Originally diagnosed in 2008. Patient completed 5 years of Arimidex in approximately August 2013.  Biopsy of the right breast only revealed fat necrosis at the site of her previous MammoSite.  Mammogram and possible biopsy as above. 4. Genetic testing: Patient has a sister and mother both with breast cancer. 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 5.  Back pain: Chronic and unchanged.  Patient was given a prescription for Flexeril today. 6.  Thumb pain: Possibly related to rapidly progressing CLL and crystal deposition giving patient gout-like symptoms.  Allopurinol as above.   I provided 30 minutes of face-to-face video visit time during this encounter which included chart review, counseling, and coordination of care as documented above.     Patient expressed understanding and was in agreement with this plan. She also understands that She can call clinic at any time with any questions, concerns, or complaints.   Cancer Staging Carcinoma of upper-outer quadrant of left breast in female, estrogen receptor negative ([place]) Staging form: Breast, AJCC 8th Edition - Clinical stage from 04/08/2017: Stage IB (cT1c, cN0, cM0, G3, ER: Negative, PR: Negative, HER2: Negative) - Signed by Lloyd Huger, MD on 04/08/2017 - Pathologic stage from  05/11/2017: Stage IB (pT1c, pN0, cM0, G3, ER: Negative, PR: Negative, HER2: Negative) - Signed by Lloyd Huger, MD on 05/11/2017   Lloyd Huger, MD   08/14/2020 11:51 AM

## 2020-08-15 ENCOUNTER — Telehealth: Payer: Self-pay

## 2020-08-15 LAB — CBC WITH DIFFERENTIAL/PLATELET
Abs Immature Granulocytes: 1.1 10*3/uL — ABNORMAL HIGH (ref 0.00–0.07)
Basophils Absolute: 0.1 10*3/uL (ref 0.0–0.1)
Basophils Relative: 0 %
Eosinophils Absolute: 0.4 10*3/uL (ref 0.0–0.5)
Eosinophils Relative: 0 %
HCT: 34.4 % — ABNORMAL LOW (ref 36.0–46.0)
Hemoglobin: 11.2 g/dL — ABNORMAL LOW (ref 12.0–15.0)
Immature Granulocytes: 1 %
Lymphocytes Relative: 61 %
Lymphs Abs: 43.7 10*3/uL — ABNORMAL HIGH (ref 0.7–4.0)
MCH: 28.9 pg (ref 26.0–34.0)
MCHC: 32.6 g/dL (ref 30.0–36.0)
MCV: 88.9 fL (ref 80.0–100.0)
Monocytes Absolute: 30.7 10*3/uL — ABNORMAL HIGH (ref 0.1–1.0)
Monocytes Relative: 22 %
Neutro Abs: 7 10*3/uL (ref 1.7–7.7)
Neutrophils Relative %: 5 %
Platelets: 147 10*3/uL — ABNORMAL LOW (ref 150–400)
RBC: 3.87 MIL/uL (ref 3.87–5.11)
RDW: 14.6 % (ref 11.5–15.5)
WBC: 72 10*3/uL (ref 4.0–10.5)
nRBC: 0 % (ref 0.0–0.2)
nRBC: 0 /100 WBC

## 2020-08-15 NOTE — Telephone Encounter (Signed)
T/C to patient about chemo class.  Pt states came through my chemo class a few years ago and still has the binder.  She states she does not feel she needs to attend another class.   Rituxan education was provided over the phone and a review of infusion processes.   Chemo education class canceled.

## 2020-08-16 ENCOUNTER — Encounter: Payer: Self-pay | Admitting: Oncology

## 2020-08-16 ENCOUNTER — Inpatient Hospital Stay: Payer: Medicare HMO

## 2020-08-16 ENCOUNTER — Inpatient Hospital Stay: Payer: Medicare HMO | Admitting: Oncology

## 2020-08-16 ENCOUNTER — Inpatient Hospital Stay: Payer: Medicare HMO | Admitting: Pharmacist

## 2020-08-16 ENCOUNTER — Other Ambulatory Visit: Payer: Medicare HMO

## 2020-08-17 ENCOUNTER — Other Ambulatory Visit: Payer: Self-pay

## 2020-08-17 ENCOUNTER — Emergency Department
Admission: EM | Admit: 2020-08-17 | Discharge: 2020-08-17 | Disposition: A | Discharge status: Home / Self Care | Payer: Medicare HMO | Attending: Emergency Medicine | Admitting: Emergency Medicine

## 2020-08-17 ENCOUNTER — Other Ambulatory Visit: Payer: Self-pay | Admitting: *Deleted

## 2020-08-17 ENCOUNTER — Inpatient Hospital Stay: Payer: Medicare HMO | Attending: Oncology

## 2020-08-17 ENCOUNTER — Inpatient Hospital Stay: Payer: Medicare HMO

## 2020-08-17 ENCOUNTER — Inpatient Hospital Stay (HOSPITAL_BASED_OUTPATIENT_CLINIC_OR_DEPARTMENT_OTHER): Payer: Medicare HMO | Admitting: Oncology

## 2020-08-17 ENCOUNTER — Emergency Department: Payer: Medicare HMO

## 2020-08-17 VITALS — BP 109/72 | HR 73 | Temp 99.3°F | Resp 24

## 2020-08-17 VITALS — BP 121/79 | HR 89 | Temp 98.1°F | Resp 20 | Wt 166.3 lb

## 2020-08-17 DIAGNOSIS — R1012 Left upper quadrant pain: Secondary | ICD-10-CM | POA: Diagnosis not present

## 2020-08-17 DIAGNOSIS — R0789 Other chest pain: Secondary | ICD-10-CM | POA: Diagnosis not present

## 2020-08-17 DIAGNOSIS — Z923 Personal history of irradiation: Secondary | ICD-10-CM | POA: Insufficient documentation

## 2020-08-17 DIAGNOSIS — Z8249 Family history of ischemic heart disease and other diseases of the circulatory system: Secondary | ICD-10-CM | POA: Diagnosis not present

## 2020-08-17 DIAGNOSIS — T8092XA Unspecified transfusion reaction, initial encounter: Secondary | ICD-10-CM | POA: Insufficient documentation

## 2020-08-17 DIAGNOSIS — Z90710 Acquired absence of both cervix and uterus: Secondary | ICD-10-CM | POA: Diagnosis not present

## 2020-08-17 DIAGNOSIS — Z87891 Personal history of nicotine dependence: Secondary | ICD-10-CM | POA: Diagnosis not present

## 2020-08-17 DIAGNOSIS — C9110 Chronic lymphocytic leukemia of B-cell type not having achieved remission: Secondary | ICD-10-CM

## 2020-08-17 DIAGNOSIS — Z79899 Other long term (current) drug therapy: Secondary | ICD-10-CM | POA: Insufficient documentation

## 2020-08-17 DIAGNOSIS — Z853 Personal history of malignant neoplasm of breast: Secondary | ICD-10-CM | POA: Insufficient documentation

## 2020-08-17 DIAGNOSIS — Z5112 Encounter for antineoplastic immunotherapy: Secondary | ICD-10-CM | POA: Diagnosis not present

## 2020-08-17 DIAGNOSIS — Z801 Family history of malignant neoplasm of trachea, bronchus and lung: Secondary | ICD-10-CM | POA: Diagnosis not present

## 2020-08-17 DIAGNOSIS — Z803 Family history of malignant neoplasm of breast: Secondary | ICD-10-CM | POA: Diagnosis not present

## 2020-08-17 DIAGNOSIS — R531 Weakness: Secondary | ICD-10-CM | POA: Diagnosis not present

## 2020-08-17 DIAGNOSIS — K219 Gastro-esophageal reflux disease without esophagitis: Secondary | ICD-10-CM | POA: Insufficient documentation

## 2020-08-17 DIAGNOSIS — R9431 Abnormal electrocardiogram [ECG] [EKG]: Secondary | ICD-10-CM

## 2020-08-17 DIAGNOSIS — Z9221 Personal history of antineoplastic chemotherapy: Secondary | ICD-10-CM | POA: Diagnosis not present

## 2020-08-17 DIAGNOSIS — R0602 Shortness of breath: Secondary | ICD-10-CM | POA: Diagnosis not present

## 2020-08-17 DIAGNOSIS — F419 Anxiety disorder, unspecified: Secondary | ICD-10-CM | POA: Insufficient documentation

## 2020-08-17 DIAGNOSIS — T8090XA Unspecified complication following infusion and therapeutic injection, initial encounter: Secondary | ICD-10-CM

## 2020-08-17 LAB — COMPREHENSIVE METABOLIC PANEL
ALT: 22 U/L (ref 0–44)
AST: 27 U/L (ref 15–41)
Albumin: 3.9 g/dL (ref 3.5–5.0)
Alkaline Phosphatase: 88 U/L (ref 38–126)
Anion gap: 14 (ref 5–15)
BUN: 24 mg/dL — ABNORMAL HIGH (ref 8–23)
CO2: 18 mmol/L — ABNORMAL LOW (ref 22–32)
Calcium: 9.1 mg/dL (ref 8.9–10.3)
Chloride: 107 mmol/L (ref 98–111)
Creatinine, Ser: 1.47 mg/dL — ABNORMAL HIGH (ref 0.44–1.00)
GFR calc Af Amer: 38 mL/min — ABNORMAL LOW (ref >60)
GFR calc non Af Amer: 33 mL/min — ABNORMAL LOW (ref >60)
Glucose, Bld: 178 mg/dL — ABNORMAL HIGH (ref 70–99)
Potassium: 4.5 mmol/L (ref 3.5–5.1)
Sodium: 139 mmol/L (ref 135–145)
Total Bilirubin: 0.4 mg/dL (ref 0.3–1.2)
Total Protein: 7.1 g/dL (ref 6.5–8.1)

## 2020-08-17 LAB — CBC WITH DIFFERENTIAL/PLATELET
Abs Immature Granulocytes: 0.79 10*3/uL — ABNORMAL HIGH (ref 0.00–0.07)
Abs Immature Granulocytes: 0.11 10*3/uL — ABNORMAL HIGH (ref 0.00–0.07)
Basophils Absolute: 0.1 10*3/uL (ref 0.0–0.1)
Basophils Absolute: 0.1 10*3/uL (ref 0.0–0.1)
Basophils Relative: 0 %
Basophils Relative: 0 %
Eosinophils Absolute: 0.2 10*3/uL (ref 0.0–0.5)
Eosinophils Absolute: 0 10*3/uL (ref 0.0–0.5)
Eosinophils Relative: 0 %
Eosinophils Relative: 0 %
HCT: 33.6 % — ABNORMAL LOW (ref 36.0–46.0)
HCT: 32 % — ABNORMAL LOW (ref 36.0–46.0)
Hemoglobin: 11 g/dL — ABNORMAL LOW (ref 12.0–15.0)
Hemoglobin: 10.4 g/dL — ABNORMAL LOW (ref 12.0–15.0)
Immature Granulocytes: 1 %
Immature Granulocytes: 0 %
Lymphocytes Relative: 68 %
Lymphocytes Relative: 77 %
Lymphs Abs: 52 10*3/uL — ABNORMAL HIGH (ref 0.7–4.0)
Lymphs Abs: 26 10*3/uL — ABNORMAL HIGH (ref 0.7–4.0)
MCH: 29 pg (ref 26.0–34.0)
MCH: 29.5 pg (ref 26.0–34.0)
MCHC: 32.7 g/dL (ref 30.0–36.0)
MCHC: 32.5 g/dL (ref 30.0–36.0)
MCV: 88.7 fL (ref 80.0–100.0)
MCV: 90.9 fL (ref 80.0–100.0)
Monocytes Absolute: 18.8 10*3/uL — ABNORMAL HIGH (ref 0.1–1.0)
Monocytes Absolute: 6.7 10*3/uL — ABNORMAL HIGH (ref 0.1–1.0)
Monocytes Relative: 25 %
Monocytes Relative: 20 %
Neutro Abs: 4.7 10*3/uL (ref 1.7–7.7)
Neutro Abs: 0.9 10*3/uL — ABNORMAL LOW (ref 1.7–7.7)
Neutrophils Relative %: 6 %
Neutrophils Relative %: 3 %
Platelets: 149 10*3/uL — ABNORMAL LOW (ref 150–400)
Platelets: 101 10*3/uL — ABNORMAL LOW (ref 150–400)
RBC: 3.79 MIL/uL — ABNORMAL LOW (ref 3.87–5.11)
RBC: 3.52 MIL/uL — ABNORMAL LOW (ref 3.87–5.11)
RDW: 14.6 % (ref 11.5–15.5)
RDW: 14.7 % (ref 11.5–15.5)
Smear Review: NORMAL
Smear Review: NORMAL
WBC Morphology: ABNORMAL
WBC Morphology: ABNORMAL
WBC: 76.5 10*3/uL (ref 4.0–10.5)
WBC: 33.7 10*3/uL — ABNORMAL HIGH (ref 4.0–10.5)
nRBC: 0 % (ref 0.0–0.2)
nRBC: 0.2 % (ref 0.0–0.2)

## 2020-08-17 LAB — URINALYSIS, COMPLETE (UACMP) WITH MICROSCOPIC
Bacteria, UA: NONE SEEN
Bilirubin Urine: NEGATIVE
Glucose, UA: NEGATIVE mg/dL
Hgb urine dipstick: NEGATIVE
Ketones, ur: NEGATIVE mg/dL
Nitrite: NEGATIVE
Protein, ur: 30 mg/dL — AB
Specific Gravity, Urine: 1.021 (ref 1.005–1.030)
pH: 5 (ref 5.0–8.0)

## 2020-08-17 LAB — BASIC METABOLIC PANEL
Anion gap: 12 (ref 5–15)
BUN: 25 mg/dL — ABNORMAL HIGH (ref 8–23)
CO2: 20 mmol/L — ABNORMAL LOW (ref 22–32)
Calcium: 8.9 mg/dL (ref 8.9–10.3)
Chloride: 106 mmol/L (ref 98–111)
Creatinine, Ser: 1.33 mg/dL — ABNORMAL HIGH (ref 0.44–1.00)
GFR calc Af Amer: 43 mL/min — ABNORMAL LOW (ref >60)
GFR calc non Af Amer: 37 mL/min — ABNORMAL LOW (ref >60)
Glucose, Bld: 95 mg/dL (ref 70–99)
Potassium: 4.8 mmol/L (ref 3.5–5.1)
Sodium: 138 mmol/L (ref 135–145)

## 2020-08-17 LAB — HEPATITIS B SURFACE ANTIGEN: Hepatitis B Surface Ag: NONREACTIVE

## 2020-08-17 LAB — MAGNESIUM: Magnesium: 2.1 mg/dL (ref 1.7–2.4)

## 2020-08-17 LAB — PHOSPHORUS: Phosphorus: 3.4 mg/dL (ref 2.5–4.6)

## 2020-08-17 LAB — TROPONIN I (HIGH SENSITIVITY)
Troponin I (High Sensitivity): 15 ng/L (ref <18)
Troponin I (High Sensitivity): 28 ng/L — ABNORMAL HIGH (ref <18)

## 2020-08-17 LAB — HEPATITIS B CORE ANTIBODY, TOTAL: Hep B Core Total Ab: NONREACTIVE

## 2020-08-17 MED ORDER — FAMOTIDINE IN NACL 20-0.9 MG/50ML-% IV SOLN
20.0000 mg | Freq: Once | INTRAVENOUS | Status: AC | PRN
  Administered 2020-08-17: 20 mg via INTRAVENOUS

## 2020-08-17 MED ORDER — DIPHENHYDRAMINE HCL 50 MG/ML IJ SOLN
50.0000 mg | Freq: Once | INTRAMUSCULAR | Status: AC | PRN
  Administered 2020-08-17: 25 mg via INTRAVENOUS

## 2020-08-17 MED ORDER — DIPHENHYDRAMINE HCL 25 MG PO CAPS
25.0000 mg | ORAL_CAPSULE | Freq: Once | ORAL | Status: AC
  Administered 2020-08-17: 25 mg via ORAL
  Filled 2020-08-17: qty 1

## 2020-08-17 MED ORDER — SODIUM CHLORIDE 0.9 % IV SOLN
375.0000 mg/m2 | Freq: Once | INTRAVENOUS | Status: AC
  Administered 2020-08-17: 700 mg via INTRAVENOUS
  Filled 2020-08-17: qty 50

## 2020-08-17 MED ORDER — SODIUM CHLORIDE 0.9 % IV SOLN
Freq: Once | INTRAVENOUS | Status: AC
  Filled 2020-08-17: qty 250

## 2020-08-17 MED ORDER — METHYLPREDNISOLONE SODIUM SUCC 125 MG IJ SOLR
125.0000 mg | Freq: Once | INTRAMUSCULAR | Status: AC | PRN
  Administered 2020-08-17: 125 mg via INTRAVENOUS

## 2020-08-17 MED ORDER — LORAZEPAM 2 MG/ML IJ SOLN
0.5000 mg | Freq: Once | INTRAMUSCULAR | Status: AC
  Administered 2020-08-17: 0.5 mg via INTRAVENOUS
  Filled 2020-08-17: qty 1

## 2020-08-17 MED ORDER — ACETAMINOPHEN 325 MG PO TABS
650.0000 mg | ORAL_TABLET | Freq: Once | ORAL | Status: AC
  Administered 2020-08-17: 650 mg via ORAL
  Filled 2020-08-17: qty 2

## 2020-08-17 MED ORDER — DEXAMETHASONE 4 MG PO TABS: 4.0000 mg | ORAL_TABLET | Freq: Two times a day (BID) | ORAL | 0 refills | Status: AC

## 2020-08-17 MED ORDER — SODIUM CHLORIDE 0.9 % IV SOLN
Freq: Once | INTRAVENOUS | Status: DC | PRN
  Filled 2020-08-17: qty 250

## 2020-08-17 MED ORDER — ALPRAZOLAM 0.25 MG PO TABS: 0.2500 mg | ORAL_TABLET | Freq: Every evening | ORAL | 0 refills | Status: DC | PRN

## 2020-08-17 NOTE — Progress Notes (Signed)
At approx. 1230, on third increase with new Rituxan, patient started to complain of left chest pain and feeling cold. Infusion was paused immediately. 1L IVF hung and 125 MG Solumedrol, 25 MG Benadryl, and 20 MG Pepcid was given. Patient was hypoxic with 02 in high 80's initially. 3L O2 applied and patient increased to 91%. BP 112/83 HR 86. Sonia Baller, NP at chairside 1232. Rigors started. BP dropped to 88/54, HR 89, O2 91% on 3L O2. Chest pain became severe and constant per patient. Per Sonia Baller, NP patient to go to ER. Patient transferred to ED.

## 2020-08-17 NOTE — Progress Notes (Signed)
[facility]  [phone] [phone]  ID: Megan Santana OB: Aug 20, 1934  MR#: [id number]  LKT#:[id number]  Patient Care Team: Lynnell Jude, MD as PCP - General (Family Medicine) Rico Junker, RN as Oncology Nurse Navigator Grayland Ormond, Kathlene November, MD as Consulting Physician (Oncology) Clemmie Krill Lynnell Jude, MD as Referring Physician (Family Medicine) Bary Castilla, Forest Gleason, MD (General Surgery) Noreene Filbert, MD as Referring Physician (Radiation Oncology)  I connected with Megan Santana on 08/17/20 at  8:45 AM EDT by video enabled telemedicine visit and verified that I am speaking with the correct person using two identifiers.   I discussed the limitations, risks, security and privacy concerns of performing an evaluation and management service by telemedicine and the availability of in-person appointments. I also discussed with the patient that there may be a patient responsible charge related to this service. The patient expressed understanding and agreed to proceed.   Other persons participating in the visit and their role in the encounter: Patient, MD.  Patient's location: Clinic. Provider's location: Home.  CHIEF COMPLAINT: Rapidly progressive CLL.  INTERVAL HISTORY: Patient agreed to video assisted telemedicine visit for further evaluation and initiation of weekly Rituxan.  She has increased anxiety as well as insomnia.  She is complaining of shortness of breath and mild chest discomfort as well.  She continues to have left flank pain related to her splenomegaly.  She has no neurologic complaints. She denies any recent fevers or illnesses.  She denies any night sweats or unintentional weight loss.  She denies any cough or hemoptysis.  She denies any nausea, vomiting, constipation, or diarrhea. She has no urinary complaints.  Patient offers no further specific complaints today.  REVIEW OF SYSTEMS:   Review of Systems  Constitutional: Positive for  malaise/fatigue. Negative for fever and weight loss.  Respiratory: Positive for shortness of breath. Negative for cough and hemoptysis.   Cardiovascular: Positive for chest pain. Negative for leg swelling.  Gastrointestinal: Negative.  Negative for abdominal pain and constipation.  Genitourinary: Positive for flank pain. Negative for dysuria.  Musculoskeletal: Positive for back pain. Negative for joint pain.  Skin: Negative.  Negative for rash.  Neurological: Positive for weakness. Negative for sensory change, focal weakness and headaches.  Psychiatric/Behavioral: Negative.  The patient is not nervous/anxious and does not have insomnia.     As per HPI. Otherwise, a complete review of systems is negative.  PAST MEDICAL HISTORY: Past Medical History:  Diagnosis Date  . Arthritis   . Breast cancer [facility]) 2007   right lumpectomy  . Breast cancer ([place]) 03/31/2017   16 mm invasive mammary carcinoma, left upper outer quadrant, T1c, N0, triple negative, histologic grade 3.  . Family history of adverse reaction to anesthesia    mom n/v  . GERD (gastroesophageal reflux disease)   . Heart murmur   . Lumbar radiculitis   . Lumbar stenosis   . Personal history of chemotherapy 2018   Left  . Personal history of radiation therapy 2007   Right  . Personal history of radiation therapy 2018   Left    PAST SURGICAL HISTORY: Past Surgical History:  Procedure Laterality Date  . ABDOMINAL HYSTERECTOMY     total  . BACK SURGERY     lumbar  . BREAST BIOPSY Bilateral 03/31/2017   bilat u/s core INVASIVE MAMMARY CARCINOMA  . BREAST BIOPSY Right 03/31/2017   DENSE FIBROSIS AND SHEETS OF MACROPHAGES  . BREAST EXCISIONAL BIOPSY Left 2018  . BREAST LUMPECTOMY  Right 2007  . BREAST LUMPECTOMY Left 04/23/2017  . BREAST LUMPECTOMY Left 04/23/2017   Procedure: BREAST LUMPECTOMY WITH EXCISION OF SENTINEL NODE;  Surgeon: Robert Bellow, MD;  Whole breast radiation ORS;  Service: General;  Laterality:  Left;  . BREAST SURGERY Right    Wide excision  . EYE SURGERY     cataracts bil  . FRACTURE SURGERY Left 2015 or 2016  . TUMOR EXCISION  2001    FAMILY HISTORY: Family History  Problem Relation Age of Onset  . Breast cancer Sister 32  . Lung cancer Mother 47       mets to breast  . Lung cancer Father 5  . Heart attack Brother   . Breast cancer Maternal Aunt        age unknown  . Stroke Sister 88    ADVANCED DIRECTIVES (Y/N):  N  HEALTH MAINTENANCE: Social History   Tobacco Use  . Smoking status: Former Smoker    Packs/day: 0.25    Years: 10.00    Pack years: 2.50    Types: Cigarettes    Quit date: 1970    Years since quitting: 51.7  . Smokeless tobacco: Never Used  Vaping Use  . Vaping Use: Never used  Substance Use Topics  . Alcohol use: Yes    Comment: occasionally  . Drug use: No     Colonoscopy:  PAP:  Bone density:  Lipid panel:  Allergies  Allergen Reactions  . Oxycodone Other (See Comments)    Can't sleep, feels bad when taking.    Current Outpatient Medications  Medication Sig Dispense Refill  . acetaminophen (TYLENOL) 500 MG tablet Take 1,000 mg by mouth every 6 (six) hours as needed (for pain.).    Marland Kitchen allopurinol (ZYLOPRIM) 300 MG tablet Take 1 tablet (300 mg total) by mouth daily. 60 tablet 1  . Calcium Carb-Cholecalciferol (CALCIUM 600+D) 600-800 MG-UNIT TABS Take 1 tablet by mouth every morning.     . calcium carbonate (TUMS EX) 750 MG chewable tablet Chew 1-2 tablets by mouth 3 (three) times daily as needed (for heartburn/indigestion).    . cholecalciferol (VITAMIN D3) 25 MCG (1000 UNIT) tablet Take 1,000 Units by mouth daily.    . cyclobenzaprine (FLEXERIL) 10 MG tablet Take 1 tablet (10 mg total) by mouth 3 (three) times daily as needed for muscle spasms. 60 tablet 0  . loratadine (CLARITIN) 10 MG tablet Take 10 mg by mouth in the morning and at bedtime.     . magnesium oxide (MAG-OX) 400 MG tablet Take 250 mg by mouth daily.     .  Multiple Vitamins-Minerals (CENTRUM SILVER 50+WOMEN) TABS Take 1 tablet by mouth daily.    . Potassium 99 MG TABS Take 99 mg by mouth daily.     . vitamin C (ASCORBIC ACID) 500 MG tablet Take 500 mg by mouth daily.    Marland Kitchen ALPRAZolam (XANAX) 0.25 MG tablet Take 1 tablet (0.25 mg total) by mouth at bedtime as needed for anxiety. and sleep. 30 tablet 0   No current facility-administered medications for this visit.   Facility-Administered Medications Ordered in Other Visits  Medication Dose Route Frequency Provider Last Rate Last Admin  . acetaminophen (TYLENOL) tablet 650 mg  650 mg Oral Once Lloyd Huger, MD      . diphenhydrAMINE (BENADRYL) capsule 25 mg  25 mg Oral Once Lloyd Huger, MD      . LORazepam (ATIVAN) injection 0.5 mg  0.5 mg Intravenous Once [place],  Kathlene November, MD      . riTUXimab-pvvr (RUXIENCE) 700 mg in sodium chloride 0.9 % 250 mL (2.1875 mg/mL) infusion  375 mg/m2 (Treatment Plan Recorded) Intravenous Once Lloyd Huger, MD        OBJECTIVE: Vitals:   08/17/20 0906  BP: 121/79  Pulse: 89  Resp: 20  Temp: 98.1 F (36.7 C)  SpO2: 98%     Body mass index is 30.42 kg/m.    ECOG FS:1 - Symptomatic but completely ambulatory  General: Well-developed, well-nourished, no acute distress. HEENT: Normocephalic. Neuro: Alert, answering all questions appropriately. Cranial nerves grossly intact. Psych: Anxious.   LAB RESULTS:  Lab Results  Component Value Date   NA 139 08/17/2020   K 4.5 08/17/2020   CL 107 08/17/2020   CO2 18 (L) 08/17/2020   GLUCOSE 178 (H) 08/17/2020   BUN 24 (H) 08/17/2020   CREATININE 1.47 (H) 08/17/2020   CALCIUM 9.1 08/17/2020   PROT 7.1 08/17/2020   ALBUMIN 3.9 08/17/2020   AST 27 08/17/2020   ALT 22 08/17/2020   ALKPHOS 88 08/17/2020   BILITOT 0.4 08/17/2020   GFRNONAA 33 (L) 08/17/2020   GFRAA 38 (L) 08/17/2020    Lab Results  Component Value Date   WBC 76.5 (HH) 08/17/2020   NEUTROABS 4.7 08/17/2020   HGB  11.0 (L) 08/17/2020   HCT 33.6 (L) 08/17/2020   MCV 88.7 08/17/2020   PLT 149 (L) 08/17/2020     STUDIES: DG Chest 2 View  Result Date: 08/13/2020 CLINICAL DATA:  Left chest pain. History of breast cancer and leukemia EXAM: CHEST - 2 VIEW COMPARISON:  08/07/2020 FINDINGS: Right paratracheal adenopathy unchanged from the prior study. No other adenopathy Heart size normal.  Vascularity normal.  Atherosclerotic aortic arch Decreased lung volume with bibasilar atelectasis which has developed in the interval. No effusion. IMPRESSION: Right paratracheal adenopathy is unchanged. CT chest with contrast recommended for further evaluation. Hypoventilation with bibasilar atelectasis. Electronically Signed   By: Franchot Gallo M.D.   On: 08/13/2020 10:50   DG Chest 2 View  Result Date: 08/07/2020 CLINICAL DATA:  86 year old female with nonproductive cough. Previous breast cancer. EXAM: CHEST - 2 VIEW COMPARISON:  Chest radiographs 09/27/2019. FINDINGS: New abnormal right paratracheal density since last year. Other mediastinal contours are stable and within normal limits. Tracheal air column remains within normal limits. Stable lungs, including probable subpleural scarring on the right in the middle lobe. No pneumothorax, pulmonary edema, pleural effusion or new pulmonary opacity. Osteopenia. No acute osseous abnormality identified. Abdominal Calcified aortic atherosclerosis. Negative visible bowel gas pattern. IMPRESSION: 1. Increased right paratracheal density in the superior mediastinum, nonspecific but suspicious for mass or lymphadenopathy. Recommend follow-up Chest CT (IV contrast preferred). 2. Elsewhere stable radiographic appearance of the chest. These results will be called to the ordering clinician or representative by the Radiologist Assistant, and communication documented in the PACS or [employer]. Electronically Signed   By: Genevie Ann M.D.   On: 08/07/2020 15:59   CT SOFT TISSUE NECK W  CONTRAST  Result Date: 08/14/2020 CLINICAL DATA:  History of bilateral breast cancer. Right sided neck pain and neck mass. History of CLL. EXAM: CT NECK WITH CONTRAST TECHNIQUE: Multidetector CT imaging of the neck was performed using the standard protocol following the bolus administration of intravenous contrast. CONTRAST:  76m OMNIPAQUE IOHEXOL 300 MG/ML  SOLN COMPARISON:  Chest CT done yesterday. FINDINGS: Pharynx and larynx: No evidence of mucosal or submucosal mass. Salivary glands: Parotid and submandibular  glands are intrinsically normal. Bilateral parotid lymph nodes as below. Thyroid: Normal Lymph nodes: Enlargement lymph nodes throughout all nodal stations on both sides of the neck. Index nodes are as follows. Right parotid node image 40 measuring 8 x 15 mm. Right level 2 node image 45 measuring 19 x 14 mm. Right level 4 node image 56 measuring 15 x 11 mm. Left level 4 node image 69 measuring 19 x 13 mm. Vascular: No abnormal vascular finding. Limited intracranial: Normal Visualized orbits: Normal Mastoids and visualized paranasal sinuses: Clear Skeleton: Ordinary cervical spondylosis and facet arthropathy. Chronic calcified disc herniation C4-5. Upper chest: See results of chest CT. Other: None IMPRESSION: Enlargement of lymph nodes throughout all nodal stations on both sides of the neck. Findings consistent with CLL. See results of index node measurements above. Electronically Signed   By: Nelson Chimes M.D.   On: 08/14/2020 20:34   CT CHEST ABDOMEN PELVIS W CONTRAST  Result Date: 08/13/2020 CLINICAL DATA:  Left flank and upper abdominal pain, beneath left breast since Friday. History of breast cancer and hysterectomy, history of CLL EXAM: CT CHEST, ABDOMEN, AND PELVIS WITH CONTRAST TECHNIQUE: Multidetector CT imaging of the chest, abdomen and pelvis was performed following the standard protocol during bolus administration of intravenous contrast. CONTRAST:  34m OMNIPAQUE IOHEXOL 300 MG/ML  SOLN  COMPARISON:  CT abdomen pelvis 01/06/2008, chest radiograph 08/13/2020 FINDINGS: CT CHEST FINDINGS Cardiovascular: Cardiac size within normal limits. Trace pericardial fluid, likely upper limits normal. Three-vessel coronary artery calcifications. Calcifications of the mitral annulus and aortic leaflets. Central pulmonary arteries are normal caliber. No large central or lobar pulmonary artery emboli on this non tailored examination. Atherosclerotic plaque within the normal caliber aorta. Normal 3 vessel branching of the aortic arch. Mild tortuosity of brachiocephalic vessels partially displaced by the mediastinal adenopathy/mass. Mediastinum/Nodes: There is extensive mediastinal and hilar adenopathy as well as a larger middle mediastinal soft tissue density/paratracheal mass measuring up to 4.8 by 5.1 by 5.1 cm in maximum AP by transverse by craniocaudal dimensions. Several larger nodes for reference include: Subpectoral left axillary node measuring up to 1.8 cm short axis (2/6) Subpectoral right axillary node measuring up to 2.3 cm short axis (2/14) AP window node measuring up to 11 mm (2/21) Minimal secretions in the trachea. No significant narrowing, occlusions or other acute abnormality. Mild lateral displacement of the esophagus by the mediastinal tissue. Small sliding-type hiatal hernia. Normal thyroid. Lungs/Pleura: Some reticular bandlike opacities in the lung bases likely reflecting a combination of scarring and architectural distortion as well as subsegmental atelectatic change. Dependent atelectasis posteriorly. No concerning pulmonary nodules or masses. Mild biapical pleuroparenchymal scarring. No consolidation, features of edema, pneumothorax, or effusion. Musculoskeletal: Likely postsurgical changes from prior lumpectomy with a rounded intermediate attenuation structure measuring approximately 3.5 x 3.6 cm in the right breast (2/34) along the chest wall with surrounding coarse calcification possibly  reflecting sequela of prior fat necrosis. Fairly circumscribed region of ground-glass density within the left breast measuring approximately 2.8 x 1.8 cm in size (2/26) could reflect prior fat necrosis as well. CT ABDOMEN PELVIS FINDINGS Hepatobiliary: No worrisome focal liver abnormality is seen. Normal gallbladder. No visible calcified gallstones. No biliary ductal dilatation. Pancreas: Unremarkable. No pancreatic ductal dilatation or surrounding inflammatory changes. Spleen: Marked splenomegaly. Minimal stranding towards the splenic hilum, nonspecific. Adrenals/Urinary Tract: No concerning adrenal lesions. Kidneys enhance and excrete symmetrically. Numerous subcentimeter hypoattenuating foci in both kidneys, statistically likely benign though should be viewed with some suspicion followed on comparison imaging  given the patient's history of malignancy. Mild bilateral perinephric stranding is chronic and similar to comparison. No urolithiasis or hydronephrosis. Urinary bladder is largely decompressed at the time of exam and therefore poorly evaluated by CT imaging. No gross bladder abnormality. Stomach/Bowel: Sliding-type hiatal hernia, as above. Serpiginous hyperenhancement along the greater curvature with numerous vascular collaterals, could reflect gastric varices. Which could be better evaluated with endoscopy. Duodenum is unremarkable. No small bowel thickening or dilatation. Portion of the transverse colon protrudes into a fat and bowel containing umbilical hernia with a 2.9 by 2.9 cm fascial defect and no convincing evidence of obstruction or strangulation at this time. No resulting mechanical obstruction. Scattered colonic diverticula without focal inflammation to suggest diverticulitis. No discernible colonic dilatation or significant wall thickening. Vascular/Lymphatic: Upper abdominal venous collaterals, as above. Atherosclerotic calcifications within the abdominal aorta and branch vessels. No aneurysm  or ectasia. Multiple enlarged nodes are seen in the abdomen and pelvis. These include: Aortocaval 14 mm node (2/64) Portacaval 18 mm node (2/62) Prominent inguinal nodes including a 13 mm right inguinal lymph node (2/106) Conspicuous though subcentimeter midmesenteric nodes but which are enlarged from comparison studies. Reproductive: Uterus is surgically absent. No concerning adnexal lesions. Other: Trace amount of thickening adjacent the enlarged spleen, nonspecific. No abdominopelvic free air or fluid. Fat and large bowel containing hernia without strangulation or obstruction, as above. Musculoskeletal: Scoliotic curvature of the thoracolumbar spine including a levocurvature apex T10 and a dextrocurvature apex L3. Multilevel degenerative changes are present in the imaged portions of the spine. Additional degenerative changes in the hips and pelvis. No acute or suspicious osseous lesions. IMPRESSION: 1. Extensive mediastinal, hilar, and axillary adenopathy as well as a larger middle mediastinal soft tissue conglomerate nodal mass measuring up to 4.8 cm. Findings are compatible with history of CLL. Correlate with laboratory findings. 2. Marked splenomegaly measuring up to 17 cm in length. Faint surrounding stranding is nonspecific but could be indicative of some splenic congestion in the absence of traumatic findings or history. 3. Upper abdominal venous collateralization including gastric varices. Consider direct visualization on an outpatient basis. 4. Likely postsurgical changes of the right breast. Circumscribed fat in the left breast could reflect developing fat necrosis the appearance on comparison mammography (05/09/2020) indicated a suspicious appearance of the associated calcifications and further biopsy was recommended. 5. Fat and large bowel containing umbilical hernia without convincing evidence of obstruction or strangulation at this time. 6. Numerous subcentimeter hypoattenuating foci in both kidneys,  statistically likely benign though should be viewed with some suspicion followed on comparison imaging given the patient's history of malignancy. 7. Colonic diverticulosis without evidence of diverticulitis. 8. Scoliotic curvature of the thoracolumbar spine. 9. Aortic Atherosclerosis (ICD10-I70.0). These results were called by telephone at the time of interpretation on 08/13/2020 at 3:49 pm to provider [facility] , who verbally acknowledged these results. Electronically Signed   By: Lovena Le M.D.   On: 08/13/2020 15:49    ASSESSMENT: Rapidly progressive CLL.  PLAN:    1. Rapidly progressive CLL: q13 deletion.  CT scan results reviewed independently and reported as above with significant, widespread lymphadenopathy as well as massive splenomegaly causing flank pain.  Patient's performance status is declining as well.  White blood cell continues to trend up and is now 76.5.  She also has a mild anemia and thrombocytopenia.  Proceed with cycle 1 of 4 of weekly Rituxan today.  Continue allopurinol as prescribed. Once patient completes her weekly Rituxan, will transition to oral Imbruvica.  Return to clinic in 1 week for further evaluation and consideration of cycle 2.  2. 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 This was likely a second primary.  Patient completed 4 cycles of Taxotere and Cytoxan August 06, 2017.  Also completed adjuvant XRT.  An aromatase inhibitor would not be of any benefit given the ER/PR status of her tumor.  Patient's most recent mammogram on May 09, 2020 was reported as BI-RADS 4, but she was instructed by her surgeon to wait 6 months for consideration of a biopsy.  3. 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 Oncotype score 17: Originally diagnosed in 2008. Patient completed 5  years of Arimidex in approximately August 2013. Biopsy of the right breast only revealed fat necrosis at the site of her previous MammoSite.  Mammogram and possible biopsy as above. 4. Genetic testing: Patient has a sister and mother both with breast cancer. 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 5.  Back pain: Chronic and unchanged.  Continue Flexeril as needed. 6.  Flank pain: Secondary to splenomegaly.  Rituxan as above. 7.  Chest pain/shortness of breath: EKG from today reviewed by cardiology.  Can consider low-dose metoprolol daily if symptoms do not resolve with Ativan.  Proceed with treatment as above.  Referral to cardiology has been placed. 8.  Anxiety: Patient was given a prescription for Xanax today.  I provided 30 minutes of face-to-face video visit time during this encounter which included chart review, counseling, and coordination of care as documented above.   Patient expressed understanding and was in agreement with this plan. She also understands that She can call clinic at any time with any questions, concerns, or complaints.   Cancer Staging Carcinoma of upper-outer quadrant of left breast in female, estrogen receptor negative ([place]) Staging form: Breast, AJCC 8th Edition - Clinical stage from 04/08/2017: Stage IB (cT1c, cN0, cM0, G3, ER: Negative, PR: Negative, HER2: Negative) - Signed by Lloyd Huger, MD on 04/08/2017 - Pathologic stage from 05/11/2017: Stage IB (pT1c, pN0, cM0, G3, ER: Negative, PR: Negative, HER2: Negative) - Signed by Lloyd Huger, MD on 05/11/2017   Lloyd Huger, MD   08/17/2020 10:27 AM

## 2020-08-17 NOTE — ED Notes (Signed)
Pt signed printed d/c paperwork since no topaz available.

## 2020-08-17 NOTE — ED Provider Notes (Signed)
Procedures     ----------------------------------------- 3:59 PM on 08/17/2020 ----------------------------------------- Patient continues to feel better.  States she has no acute symptoms currently, no chest pain.  Vital signs are normal, 130/80 at 3:30 PM last check, heart rate 75, respirations 18, pulse ox 98% on room air.  Dr. Charna Archer has sent a Decadron prescription to the patient's pharmacy per oncology plan, and the patient will follow up with Dr. Grayland Ormond .     Carrie Mew, MD 08/17/20 1600

## 2020-08-17 NOTE — ED Notes (Signed)
Visitor at bedside.

## 2020-08-17 NOTE — Progress Notes (Signed)
RE: Infusion Reaction  Called to infusion for reaction at third rate change.  Developed left-sided worsening chest discomfort, rigors and hypoxia.  Oxygen saturations in the high 80s on 3 L.  Became hypotensive blood pressure 88/46.   She was given Pepcid, Benadryl and Solu-Medrol.  She was taken to the emergency room for further evaluation.  Prechemo EKG was evaluated by both Dr. Grayland Ormond and cardiology.Faythe Casa, NP 08/17/2020 1:00 PM

## 2020-08-17 NOTE — ED Triage Notes (Signed)
Pt to ER after receiving infusion which pt had an allergic reaction to. Initially hypotensive with CP. Pt A&Ox4. Skin dry. Abd pain per pt. EKG completed in ER. EDP Jessup at bedside.

## 2020-08-17 NOTE — ED Notes (Signed)
Pt given cup of water with verbal okay from EDP Jessup.  

## 2020-08-17 NOTE — ED Provider Notes (Signed)
[facility] Emergency Department Provider Note   ____________________________________________   First MD Initiated Contact with Patient 08/17/20 1306     (approximate)  I have reviewed the triage vital signs and the nursing notes.   HISTORY  Chief Complaint Allergic Reaction and Hypotension    HPI Megan Santana is a 86 y.o. female with past medical history of CLL, breast cancer, and GERD who presents to the ED for allergic reaction.  Patient received her first infusion of Rituxan earlier today and shortly afterwards started to complain of left-sided chest pain as well as chills and shivering.  Infusion was then immediately stopped and she was treated with 1 L IV fluids, 125 mg Solu-Medrol, 25 mg Benadryl, and 20 mg of Pepcid.  She was noted to be hypoxic with O2 sats in the high 80s, placed on 3 L nasal cannula by infusion nurse.  She did have a brief period of hypotension with BP reaching 88/54, additionally complained of pain along the left side of her chest and in the left upper quadrant of her abdomen.  She was subsequently transferred to the ED for further evaluation.  She now states that she has been feeling generally weak and crummy for the couple weeks prior to her infusion, denies any fevers, cough, dysuria, or hematuria.  She does states she has also been dealing with the pain in her left chest and left upper quadrant for multiple weeks, had a CT scan performed a couple of days ago here in the ED showing splenomegaly.  She was told by her oncologist that this is the source of her pain and her pain is unchanged today.  She denies any rash, vomiting, or diarrhea.        Past Medical History:  Diagnosis Date  . Arthritis   . Breast cancer [facility]) 2007   right lumpectomy  . Breast cancer ([place]) 03/31/2017   16 mm invasive mammary carcinoma, left upper outer quadrant, T1c, N0, triple negative, histologic grade 3.  . Family history of adverse reaction to  anesthesia    mom n/v  . GERD (gastroesophageal reflux disease)   . Heart murmur   . Lumbar radiculitis   . Lumbar stenosis   . Personal history of chemotherapy 2018   Left  . Personal history of radiation therapy 2007   Right  . Personal history of radiation therapy 2018   Left    Patient Active Problem List   Diagnosis Date Noted  . CLL (chronic lymphocytic leukemia) ([place]) 02/10/2020  . Goals of care, counseling/discussion 05/11/2017  . Carcinoma of upper-outer quadrant of left breast in female, estrogen receptor negative ([place]) 04/08/2017    Past Surgical History:  Procedure Laterality Date  . ABDOMINAL HYSTERECTOMY     total  . BACK SURGERY     lumbar  . BREAST BIOPSY Bilateral 03/31/2017   bilat u/s core INVASIVE MAMMARY CARCINOMA  . BREAST BIOPSY Right 03/31/2017   DENSE FIBROSIS AND SHEETS OF MACROPHAGES  . BREAST EXCISIONAL BIOPSY Left 2018  . BREAST LUMPECTOMY Right 2007  . BREAST LUMPECTOMY Left 04/23/2017  . BREAST LUMPECTOMY Left 04/23/2017   Procedure: BREAST LUMPECTOMY WITH EXCISION OF SENTINEL NODE;  Surgeon: Robert Bellow, MD;  Whole breast radiation ORS;  Service: General;  Laterality: Left;  . BREAST SURGERY Right    Wide excision  . EYE SURGERY     cataracts bil  . FRACTURE SURGERY Left 2015 or 2016  . TUMOR EXCISION  2001  Prior to Admission medications   Medication Sig Start Date End Date Taking? Authorizing Provider  acetaminophen (TYLENOL) 500 MG tablet Take 1,000 mg by mouth every 6 (six) hours as needed (for pain.).    [provider]  allopurinol (ZYLOPRIM) 300 MG tablet Take 1 tablet (300 mg total) by mouth daily. 08/14/20   Lloyd Huger, MD  ALPRAZolam Duanne Moron) 0.25 MG tablet Take 1 tablet (0.25 mg total) by mouth at bedtime as needed for anxiety. and sleep. 08/17/20   Lloyd Huger, MD  Calcium Carb-Cholecalciferol (CALCIUM 600+D) 600-800 MG-UNIT TABS Take 1 tablet by mouth every morning.     [provider]   calcium carbonate (TUMS EX) 750 MG chewable tablet Chew 1-2 tablets by mouth 3 (three) times daily as needed (for heartburn/indigestion).    [provider]  cholecalciferol (VITAMIN D3) 25 MCG (1000 UNIT) tablet Take 1,000 Units by mouth daily.    [provider]  cyclobenzaprine (FLEXERIL) 10 MG tablet Take 1 tablet (10 mg total) by mouth 3 (three) times daily as needed for muscle spasms. 08/14/20   Lloyd Huger, MD  loratadine (CLARITIN) 10 MG tablet Take 10 mg by mouth in the morning and at bedtime.     [provider]  magnesium oxide (MAG-OX) 400 MG tablet Take 250 mg by mouth daily.     [provider]  Multiple Vitamins-Minerals (CENTRUM SILVER 50+WOMEN) TABS Take 1 tablet by mouth daily.    [provider]  Potassium 99 MG TABS Take 99 mg by mouth daily.     [provider]  vitamin C (ASCORBIC ACID) 500 MG tablet Take 500 mg by mouth daily.    [provider]    Allergies Oxycodone  Family History  Problem Relation Age of Onset  . Breast cancer Sister 43  . Lung cancer Mother 65       mets to breast  . Lung cancer Father 56  . Heart attack Brother   . Breast cancer Maternal Aunt        age unknown  . Stroke Sister 59    Social History Social History   Tobacco Use  . Smoking status: Former Smoker    Packs/day: 0.25    Years: 10.00    Pack years: 2.50    Types: Cigarettes    Quit date: 1970    Years since quitting: 51.7  . Smokeless tobacco: Never Used  Vaping Use  . Vaping Use: Never used  Substance Use Topics  . Alcohol use: Yes    Comment: occasionally  . Drug use: No    Review of Systems  Constitutional: No fever/chills.  Positive for generalized weakness. Eyes: No visual changes. ENT: No sore throat. Cardiovascular: Positive for chest pain. Respiratory: Positive for shortness of breath. Gastrointestinal: Positive for abdominal pain.  No nausea, no vomiting.  No diarrhea.  No  constipation. Genitourinary: Negative for dysuria. Musculoskeletal: Negative for back pain. Skin: Negative for rash. Neurological: Negative for headaches, focal weakness or numbness.  ____________________________________________   PHYSICAL EXAM:  VITAL SIGNS: ED Triage Vitals  Enc Vitals Group     BP      Pulse      Resp      Temp      Temp src      SpO2      Weight      Height      Head Circumference      Peak Flow  Pain Score      Pain Loc      Pain Edu?      Excl. in [place]?     Constitutional: Alert and oriented. Eyes: Conjunctivae are normal. Head: Atraumatic. Nose: No congestion/rhinnorhea. Mouth/Throat: Mucous membranes are moist. Neck: Normal ROM Cardiovascular: Normal rate, regular rhythm. Grossly normal heart sounds. Respiratory: Normal respiratory effort.  No retractions. Lungs CTAB. Gastrointestinal: Soft and tender to palpation in left upper quadrant with no rebound or guarding. No distention. Genitourinary: deferred Musculoskeletal: No lower extremity tenderness nor edema. Neurologic:  Normal speech and language. No gross focal neurologic deficits are appreciated. Skin:  Skin is warm, dry and intact. No rash noted. Psychiatric: Mood and affect are normal. Speech and behavior are normal.  ____________________________________________   LABS (all labs ordered are listed, but only abnormal results are displayed)  Labs Reviewed  CBC WITH DIFFERENTIAL/PLATELET - Abnormal; Notable for the following components:      Result Value   WBC 33.7 (*)    RBC 3.52 (*)    Hemoglobin 10.4 (*)    HCT 32.0 (*)    Platelets 101 (*)    Neutro Abs 0.9 (*)    Lymphs Abs 26.0 (*)    Monocytes Absolute 6.7 (*)    Abs Immature Granulocytes 0.11 (*)    All other components within normal limits  BASIC METABOLIC PANEL - Abnormal; Notable for the following components:   CO2 20 (*)    BUN 25 (*)    Creatinine, Ser 1.33 (*)    GFR calc non Af Amer 37 (*)    GFR calc Af  Amer 43 (*)    All other components within normal limits  TROPONIN I (HIGH SENSITIVITY)   ____________________________________________  EKG  ED ECG REPORT I, Blake Divine, the attending physician, personally viewed and interpreted this ECG.   Date: 08/17/2020  EKG Time: 12:53  Rate: 89  Rhythm: normal sinus rhythm  Axis: Normal  Intervals:none  ST&T Change: None   ED ECG REPORT I, Blake Divine, the attending physician, personally viewed and interpreted this ECG.   Date: 08/17/2020  EKG Time: 13:06  Rate: 81  Rhythm: normal sinus rhythm  Axis: Normal  Intervals:none  ST&T Change: None   PROCEDURES  Procedure(s) performed (including Critical Care):  Procedures   ____________________________________________   INITIAL IMPRESSION / ASSESSMENT AND PLAN / ED COURSE       86 year old female with past medical history of CLL, breast cancer, and GERD who presents to the ED complaining of acute onset chills, shaking, and shortness of breath while receiving her initial rituximab infusion.  There was concern that she became hypoxic and hypotensive at infusion center, was subsequently given IV fluid bolus, Solu-Medrol, Benadryl, and Pepcid prior to transfer to the ED.  On arrival to the ED she is not in any respiratory distress, taken off of 3 L nasal cannula and is maintaining O2 sats on room air.  Her blood pressure is also normalized following IV fluids, no evidence of anaphylaxis at this time and presentation appears more consistent with infusion reaction.  Screening labs are unremarkable and I doubt ACS as there are no acute ischemic changes on EKG and troponin within normal limits.  Chest x-ray is negative for acute process.  We will continue to observe here in the ED for any recurrence of reaction, plan to discuss with oncology.  Case discussed with Dr. Rogue Bussing of oncology, who agrees with plan for discharge home if patient's symptoms continue to  improve and she remains  hemodynamically stable. He recommends prescription for Decadron to take at home and patient may otherwise follow-up with Dr. Grayland Ormond. UA and repeat troponin are pending at this time, if these are unremarkable then patient may be discharged home. Patient turned over to oncoming provider pending additional results.      ____________________________________________   FINAL CLINICAL IMPRESSION(S) / ED DIAGNOSES  Final diagnoses:  Infusion reaction, initial encounter     ED Discharge Orders    None       Note:  This document was prepared using Dragon voice recognition software and may include unintentional dictation errors.   Blake Divine, MD 08/17/20 [phone]

## 2020-08-18 NOTE — Progress Notes (Signed)
[place]  [phone] [phone]) [phone]  ID: Megan Santana OB: October 06, 1934  MR#: [id number]  OEH#:[id number]  Patient Care Team: Lynnell Jude, MD as PCP - General (Family Medicine) Rico Junker, RN as Oncology Nurse Navigator Grayland Ormond, Kathlene November, MD as Consulting Physician (Oncology) Clemmie Krill Lynnell Jude, MD as Referring Physician (Family Medicine) Bary Castilla, Forest Gleason, MD (General Surgery) Noreene Filbert, MD as Referring Physician (Radiation Oncology)   CHIEF COMPLAINT: Rapidly progressive CLL.  INTERVAL HISTORY: Patient returns to clinic today for further evaluation and treatment planning.  She had a reaction to her first infusion of Rituxan requiring transportation to the ER.  She is now fully recovered and back to her baseline.  Her anxiety and insomnia have improved.  She does not complain of any shortness of breath or chest pain today.  She continues to have mild left flank pain secondary to her splenomegaly.  She has no neurologic complaints. She denies any recent fevers or illnesses.  She denies any night sweats or unintentional weight loss.  She denies any cough or hemoptysis.  She denies any nausea, vomiting, constipation, or diarrhea. She has no urinary complaints.  Patient offers no further specific complaints today.  REVIEW OF SYSTEMS:   Review of Systems  Constitutional: Negative.  Negative for fever, malaise/fatigue and weight loss.  Respiratory: Negative.  Negative for cough, hemoptysis and shortness of breath.   Cardiovascular: Negative.  Negative for chest pain and leg swelling.  Gastrointestinal: Negative.  Negative for abdominal pain and constipation.  Genitourinary: Positive for flank pain. Negative for dysuria.  Musculoskeletal: Positive for back pain. Negative for joint pain.  Skin: Negative.  Negative for rash.  Neurological: Negative.  Negative for sensory change, focal weakness, weakness and headaches.  Psychiatric/Behavioral:  Negative.  The patient is not nervous/anxious and does not have insomnia.     As per HPI. Otherwise, a complete review of systems is negative.  PAST MEDICAL HISTORY: Past Medical History:  Diagnosis Date   Arthritis    Breast cancer ([facility]) 2007   right lumpectomy   Breast cancer ([facility]) 03/31/2017   16 mm invasive mammary carcinoma, left upper outer quadrant, T1c, N0, triple negative, histologic grade 3.   Family history of adverse reaction to anesthesia    mom n/v   GERD (gastroesophageal reflux disease)    Heart murmur    Lumbar radiculitis    Lumbar stenosis    Personal history of chemotherapy 2018   Left   Personal history of radiation therapy 2007   Right   Personal history of radiation therapy 2018   Left    PAST SURGICAL HISTORY: Past Surgical History:  Procedure Laterality Date   ABDOMINAL HYSTERECTOMY     total   BACK SURGERY     lumbar   BREAST BIOPSY Bilateral 03/31/2017   bilat u/s core INVASIVE MAMMARY CARCINOMA   BREAST BIOPSY Right 03/31/2017   DENSE FIBROSIS AND SHEETS OF MACROPHAGES   BREAST EXCISIONAL BIOPSY Left 2018   BREAST LUMPECTOMY Right 2007   BREAST LUMPECTOMY Left 04/23/2017   BREAST LUMPECTOMY Left 04/23/2017   Procedure: BREAST LUMPECTOMY WITH EXCISION OF SENTINEL NODE;  Surgeon: Robert Bellow, MD;  Whole breast radiation ORS;  Service: General;  Laterality: Left;   BREAST SURGERY Right    Wide excision   EYE SURGERY     cataracts bil   FRACTURE SURGERY Left 2015 or 2016   TUMOR EXCISION  2001    FAMILY HISTORY: Family History  Problem Relation Age of Onset   Breast cancer Sister 32   Lung cancer Mother 76       mets to breast   Lung cancer Father 38   Heart attack Brother    Breast cancer Maternal Aunt        age unknown   Stroke Sister 34    ADVANCED DIRECTIVES (Y/N):  N  HEALTH MAINTENANCE: Social History   Tobacco Use   Smoking status: Former Smoker    Packs/day: 0.25    Years:  10.00    Pack years: 2.50    Types: Cigarettes    Quit date: 1970    Years since quitting: 51.7   Smokeless tobacco: Never Used  Scientific laboratory technician Use: Never used  Substance Use Topics   Alcohol use: Yes    Comment: occasionally   Drug use: No     Colonoscopy:  PAP:  Bone density:  Lipid panel:  Allergies  Allergen Reactions   Oxycodone Other (See Comments)    Can't sleep, feels bad when taking.    Current Outpatient Medications  Medication Sig Dispense Refill   acetaminophen (TYLENOL) 500 MG tablet Take 1,000 mg by mouth every 6 (six) hours as needed (for pain.).     allopurinol (ZYLOPRIM) 300 MG tablet Take 1 tablet (300 mg total) by mouth daily. 60 tablet 1   ALPRAZolam (XANAX) 0.25 MG tablet Take 1 tablet (0.25 mg total) by mouth at bedtime as needed for anxiety. and sleep. 30 tablet 0   Calcium Carb-Cholecalciferol (CALCIUM 600+D) 600-800 MG-UNIT TABS Take 1 tablet by mouth every morning.      calcium carbonate (TUMS EX) 750 MG chewable tablet Chew 1-2 tablets by mouth 3 (three) times daily as needed (for heartburn/indigestion).     cholecalciferol (VITAMIN D3) 25 MCG (1000 UNIT) tablet Take 1,000 Units by mouth daily.     cyclobenzaprine (FLEXERIL) 10 MG tablet Take 1 tablet (10 mg total) by mouth 3 (three) times daily as needed for muscle spasms. 60 tablet 0   loratadine (CLARITIN) 10 MG tablet Take 10 mg by mouth in the morning and at bedtime.      magnesium oxide (MAG-OX) 400 MG tablet Take 250 mg by mouth daily.      Multiple Vitamins-Minerals (CENTRUM SILVER 50+WOMEN) TABS Take 1 tablet by mouth daily.     Potassium 99 MG TABS Take 99 mg by mouth daily.      vitamin C (ASCORBIC ACID) 500 MG tablet Take 500 mg by mouth daily.     HYDROcodone-acetaminophen (NORCO) 5-325 MG tablet Take 1 tablet by mouth every 6 (six) hours as needed for moderate pain. 30 tablet 0   Ibrutinib 420 MG TABS Take 420 mg by mouth daily. 28 tablet 2   No current  facility-administered medications for this visit.    OBJECTIVE: Vitals:   08/24/20 0845  BP: (!) 143/97  Pulse: 72  Temp: 98.7 F (37.1 C)     Body mass index is 30.45 kg/m.    ECOG FS:1 - Symptomatic but completely ambulatory  General: Well-developed, well-nourished, no acute distress. Eyes: Pink conjunctiva, anicteric sclera. HEENT: Normocephalic, moist mucous membranes. Lungs: No audible wheezing or coughing. Heart: Regular rate and rhythm. Abdomen: Soft, nontender, no obvious distention. Musculoskeletal: No edema, cyanosis, or clubbing. Neuro: Alert, answering all questions appropriately. Cranial nerves grossly intact. Skin: No rashes or petechiae noted. Psych: Normal affect.    LAB RESULTS:  Lab Results  Component Value Date  NA 138 08/24/2020   K 4.9 08/24/2020   CL 105 08/24/2020   CO2 22 08/24/2020   GLUCOSE 95 08/24/2020   BUN 35 (H) 08/24/2020   CREATININE 1.27 (H) 08/24/2020   CALCIUM 9.5 08/24/2020   PROT 6.8 08/24/2020   ALBUMIN 3.9 08/24/2020   AST 17 08/24/2020   ALT 21 08/24/2020   ALKPHOS 62 08/24/2020   BILITOT 0.6 08/24/2020   GFRNONAA 39 (L) 08/24/2020   GFRAA 45 (L) 08/24/2020    Lab Results  Component Value Date   WBC 76.1 (HH) 08/24/2020   NEUTROABS 4.2 08/24/2020   HGB 10.9 (L) 08/24/2020   HCT 33.1 (L) 08/24/2020   MCV 90.4 08/24/2020   PLT 181 08/24/2020     STUDIES: DG Chest 2 View  Result Date: 08/17/2020 CLINICAL DATA:  Chest pain.  History breast cancer.  CLL. EXAM: CHEST - 2 VIEW COMPARISON:  08/13/2020 FINDINGS: Heart size normal. Mild atherosclerotic calcification in the aortic arch. Negative for heart failure. Right paratracheal soft tissue thickening compatible with adenopathy unchanged. Streaky markings in the lung bases similar to the prior study. No acute infiltrate. No significant pleural fluid. IMPRESSION: Mild streaky atelectasis in the lung bases unchanged. No new infiltrate. Electronically Signed   By: Franchot Gallo M.D.   On: 08/17/2020 14:11   DG Chest 2 View  Result Date: 08/13/2020 CLINICAL DATA:  Left chest pain. History of breast cancer and leukemia EXAM: CHEST - 2 VIEW COMPARISON:  08/07/2020 FINDINGS: Right paratracheal adenopathy unchanged from the prior study. No other adenopathy Heart size normal.  Vascularity normal.  Atherosclerotic aortic arch Decreased lung volume with bibasilar atelectasis which has developed in the interval. No effusion. IMPRESSION: Right paratracheal adenopathy is unchanged. CT chest with contrast recommended for further evaluation. Hypoventilation with bibasilar atelectasis. Electronically Signed   By: Franchot Gallo M.D.   On: 08/13/2020 10:50   DG Chest 2 View  Result Date: 08/07/2020 CLINICAL DATA:  86 year old female with nonproductive cough. Previous breast cancer. EXAM: CHEST - 2 VIEW COMPARISON:  Chest radiographs 09/27/2019. FINDINGS: New abnormal right paratracheal density since last year. Other mediastinal contours are stable and within normal limits. Tracheal air column remains within normal limits. Stable lungs, including probable subpleural scarring on the right in the middle lobe. No pneumothorax, pulmonary edema, pleural effusion or new pulmonary opacity. Osteopenia. No acute osseous abnormality identified. Abdominal Calcified aortic atherosclerosis. Negative visible bowel gas pattern. IMPRESSION: 1. Increased right paratracheal density in the superior mediastinum, nonspecific but suspicious for mass or lymphadenopathy. Recommend follow-up Chest CT (IV contrast preferred). 2. Elsewhere stable radiographic appearance of the chest. These results will be called to the ordering clinician or representative by the Radiologist Assistant, and communication documented in the PACS or [employer]. Electronically Signed   By: Genevie Ann M.D.   On: 08/07/2020 15:59   CT SOFT TISSUE NECK W CONTRAST  Result Date: 08/14/2020 CLINICAL DATA:  History of bilateral breast  cancer. Right sided neck pain and neck mass. History of CLL. EXAM: CT NECK WITH CONTRAST TECHNIQUE: Multidetector CT imaging of the neck was performed using the standard protocol following the bolus administration of intravenous contrast. CONTRAST:  58m OMNIPAQUE IOHEXOL 300 MG/ML  SOLN COMPARISON:  Chest CT done yesterday. FINDINGS: Pharynx and larynx: No evidence of mucosal or submucosal mass. Salivary glands: Parotid and submandibular glands are intrinsically normal. Bilateral parotid lymph nodes as below. Thyroid: Normal Lymph nodes: Enlargement lymph nodes throughout all nodal stations on both sides of the  neck. Index nodes are as follows. Right parotid node image 40 measuring 8 x 15 mm. Right level 2 node image 45 measuring 19 x 14 mm. Right level 4 node image 56 measuring 15 x 11 mm. Left level 4 node image 69 measuring 19 x 13 mm. Vascular: No abnormal vascular finding. Limited intracranial: Normal Visualized orbits: Normal Mastoids and visualized paranasal sinuses: Clear Skeleton: Ordinary cervical spondylosis and facet arthropathy. Chronic calcified disc herniation C4-5. Upper chest: See results of chest CT. Other: None IMPRESSION: Enlargement of lymph nodes throughout all nodal stations on both sides of the neck. Findings consistent with CLL. See results of index node measurements above. Electronically Signed   By: Nelson Chimes M.D.   On: 08/14/2020 20:34   CT CHEST ABDOMEN PELVIS W CONTRAST  Result Date: 08/13/2020 CLINICAL DATA:  Left flank and upper abdominal pain, beneath left breast since Friday. History of breast cancer and hysterectomy, history of CLL EXAM: CT CHEST, ABDOMEN, AND PELVIS WITH CONTRAST TECHNIQUE: Multidetector CT imaging of the chest, abdomen and pelvis was performed following the standard protocol during bolus administration of intravenous contrast. CONTRAST:  10m OMNIPAQUE IOHEXOL 300 MG/ML  SOLN COMPARISON:  CT abdomen pelvis 01/06/2008, chest radiograph 08/13/2020 FINDINGS:  CT CHEST FINDINGS Cardiovascular: Cardiac size within normal limits. Trace pericardial fluid, likely upper limits normal. Three-vessel coronary artery calcifications. Calcifications of the mitral annulus and aortic leaflets. Central pulmonary arteries are normal caliber. No large central or lobar pulmonary artery emboli on this non tailored examination. Atherosclerotic plaque within the normal caliber aorta. Normal 3 vessel branching of the aortic arch. Mild tortuosity of brachiocephalic vessels partially displaced by the mediastinal adenopathy/mass. Mediastinum/Nodes: There is extensive mediastinal and hilar adenopathy as well as a larger middle mediastinal soft tissue density/paratracheal mass measuring up to 4.8 by 5.1 by 5.1 cm in maximum AP by transverse by craniocaudal dimensions. Several larger nodes for reference include: Subpectoral left axillary node measuring up to 1.8 cm short axis (2/6) Subpectoral right axillary node measuring up to 2.3 cm short axis (2/14) AP window node measuring up to 11 mm (2/21) Minimal secretions in the trachea. No significant narrowing, occlusions or other acute abnormality. Mild lateral displacement of the esophagus by the mediastinal tissue. Small sliding-type hiatal hernia. Normal thyroid. Lungs/Pleura: Some reticular bandlike opacities in the lung bases likely reflecting a combination of scarring and architectural distortion as well as subsegmental atelectatic change. Dependent atelectasis posteriorly. No concerning pulmonary nodules or masses. Mild biapical pleuroparenchymal scarring. No consolidation, features of edema, pneumothorax, or effusion. Musculoskeletal: Likely postsurgical changes from prior lumpectomy with a rounded intermediate attenuation structure measuring approximately 3.5 x 3.6 cm in the right breast (2/34) along the chest wall with surrounding coarse calcification possibly reflecting sequela of prior fat necrosis. Fairly circumscribed region of  ground-glass density within the left breast measuring approximately 2.8 x 1.8 cm in size (2/26) could reflect prior fat necrosis as well. CT ABDOMEN PELVIS FINDINGS Hepatobiliary: No worrisome focal liver abnormality is seen. Normal gallbladder. No visible calcified gallstones. No biliary ductal dilatation. Pancreas: Unremarkable. No pancreatic ductal dilatation or surrounding inflammatory changes. Spleen: Marked splenomegaly. Minimal stranding towards the splenic hilum, nonspecific. Adrenals/Urinary Tract: No concerning adrenal lesions. Kidneys enhance and excrete symmetrically. Numerous subcentimeter hypoattenuating foci in both kidneys, statistically likely benign though should be viewed with some suspicion followed on comparison imaging given the patient's history of malignancy. Mild bilateral perinephric stranding is chronic and similar to comparison. No urolithiasis or hydronephrosis. Urinary bladder is largely decompressed at  the time of exam and therefore poorly evaluated by CT imaging. No gross bladder abnormality. Stomach/Bowel: Sliding-type hiatal hernia, as above. Serpiginous hyperenhancement along the greater curvature with numerous vascular collaterals, could reflect gastric varices. Which could be better evaluated with endoscopy. Duodenum is unremarkable. No small bowel thickening or dilatation. Portion of the transverse colon protrudes into a fat and bowel containing umbilical hernia with a 2.9 by 2.9 cm fascial defect and no convincing evidence of obstruction or strangulation at this time. No resulting mechanical obstruction. Scattered colonic diverticula without focal inflammation to suggest diverticulitis. No discernible colonic dilatation or significant wall thickening. Vascular/Lymphatic: Upper abdominal venous collaterals, as above. Atherosclerotic calcifications within the abdominal aorta and branch vessels. No aneurysm or ectasia. Multiple enlarged nodes are seen in the abdomen and pelvis.  These include: Aortocaval 14 mm node (2/64) Portacaval 18 mm node (2/62) Prominent inguinal nodes including a 13 mm right inguinal lymph node (2/106) Conspicuous though subcentimeter midmesenteric nodes but which are enlarged from comparison studies. Reproductive: Uterus is surgically absent. No concerning adnexal lesions. Other: Trace amount of thickening adjacent the enlarged spleen, nonspecific. No abdominopelvic free air or fluid. Fat and large bowel containing hernia without strangulation or obstruction, as above. Musculoskeletal: Scoliotic curvature of the thoracolumbar spine including a levocurvature apex T10 and a dextrocurvature apex L3. Multilevel degenerative changes are present in the imaged portions of the spine. Additional degenerative changes in the hips and pelvis. No acute or suspicious osseous lesions. IMPRESSION: 1. Extensive mediastinal, hilar, and axillary adenopathy as well as a larger middle mediastinal soft tissue conglomerate nodal mass measuring up to 4.8 cm. Findings are compatible with history of CLL. Correlate with laboratory findings. 2. Marked splenomegaly measuring up to 17 cm in length. Faint surrounding stranding is nonspecific but could be indicative of some splenic congestion in the absence of traumatic findings or history. 3. Upper abdominal venous collateralization including gastric varices. Consider direct visualization on an outpatient basis. 4. Likely postsurgical changes of the right breast. Circumscribed fat in the left breast could reflect developing fat necrosis the appearance on comparison mammography (05/09/2020) indicated a suspicious appearance of the associated calcifications and further biopsy was recommended. 5. Fat and large bowel containing umbilical hernia without convincing evidence of obstruction or strangulation at this time. 6. Numerous subcentimeter hypoattenuating foci in both kidneys, statistically likely benign though should be viewed with some suspicion  followed on comparison imaging given the patient's history of malignancy. 7. Colonic diverticulosis without evidence of diverticulitis. 8. Scoliotic curvature of the thoracolumbar spine. 9. Aortic Atherosclerosis (ICD10-I70.0). These results were called by telephone at the time of interpretation on 08/13/2020 at 3:49 pm to provider [facility] , who verbally acknowledged these results. Electronically Signed   By: Lovena Le M.D.   On: 08/13/2020 15:49    ASSESSMENT: Rapidly progressive CLL.  PLAN:    1. Rapidly progressive CLL: q13 deletion.  CT scan results reviewed independently and reported as above with significant, widespread lymphadenopathy as well as massive splenomegaly causing flank pain.  Patient had a reaction to Rituxan requiring transfer patient therefore this has been discontinued.  We will proceed with single agent Imbruvica only. Continue allopurinol as prescribed.  Return to clinic in 2 weeks for repeat laboratory work, further evaluation, and assessment of her toleration of treatment.  2. 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 This was likely a  second primary.  Patient completed 4 cycles of Taxotere and Cytoxan August 06, 2017.  Also completed adjuvant XRT.  An aromatase inhibitor would not be of any benefit given the ER/PR status of her tumor.  Patient's most recent mammogram on May 09, 2020 was reported as BI-RADS 4.  Patient and her surgeon agreed to wait 6 months with for repeat mammogram in October 2021. 3. 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 Oncotype score 17: Originally diagnosed in 2008. Patient completed 5 years of Arimidex in approximately August 2013. Biopsy of the right breast only revealed fat necrosis at the site of her previous MammoSite.  Mammogram and possible  biopsy as above. 4. Genetic testing: Patient has a sister and mother both with breast cancer. 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 5.  Back pain: Chronic and unchanged.  Continue Flexeril as needed. 6.  Flank pain: Secondary to splenomegaly.  Imbruvica as above.  Patient was also given a prescription for Vicodin. 7.  Chest pain/shortness of breath: Significantly improved.  Appreciate cardiology input.  Continue follow-up with them as scheduled. 8.  Anxiety: Improved, continue Xanax as needed.   Patient expressed understanding and was in agreement with this plan. She also understands that She can call clinic at any time with any questions, concerns, or complaints.   Cancer Staging Carcinoma of upper-outer quadrant of left breast in female, estrogen receptor negative ([place]) Staging form: Breast, AJCC 8th Edition - Clinical stage from 04/08/2017: Stage IB (cT1c, cN0, cM0, G3, ER: Negative, PR: Negative, HER2: Negative) - Signed by Lloyd Huger, MD on 04/08/2017 - Pathologic stage from 05/11/2017: Stage IB (pT1c, pN0, cM0, G3, ER: Negative, PR: Negative, HER2: Negative) - Signed by Lloyd Huger, MD on 05/11/2017   Lloyd Huger, MD   08/24/2020 3:40 PM

## 2020-08-21 ENCOUNTER — Telehealth: Payer: Self-pay | Admitting: *Deleted

## 2020-08-21 NOTE — Telephone Encounter (Signed)
Call returned to pts daughter.

## 2020-08-21 NOTE — Telephone Encounter (Signed)
Patient's daughter called regarding her appointments this week. She wanted to know if there was a change in plan due to patient's reaction to infusion.

## 2020-08-21 NOTE — Telephone Encounter (Signed)
Keep appointment for lab and MD, no infusion but I will start imbruvica sooner than later

## 2020-08-23 ENCOUNTER — Ambulatory Visit: Payer: Medicare HMO | Admitting: Cardiovascular Disease

## 2020-08-23 ENCOUNTER — Telehealth: Payer: Self-pay | Admitting: Pharmacy Technician

## 2020-08-23 ENCOUNTER — Encounter: Payer: Self-pay | Admitting: Oncology

## 2020-08-23 ENCOUNTER — Other Ambulatory Visit: Payer: Self-pay

## 2020-08-23 ENCOUNTER — Encounter: Payer: Self-pay | Admitting: Cardiovascular Disease

## 2020-08-23 VITALS — BP 106/62 | HR 74 | Ht 62.0 in | Wt 165.0 lb

## 2020-08-23 DIAGNOSIS — R9431 Abnormal electrocardiogram [ECG] [EKG]: Secondary | ICD-10-CM | POA: Diagnosis not present

## 2020-08-23 DIAGNOSIS — R778 Other specified abnormalities of plasma proteins: Secondary | ICD-10-CM | POA: Diagnosis not present

## 2020-08-23 DIAGNOSIS — R0602 Shortness of breath: Secondary | ICD-10-CM | POA: Diagnosis not present

## 2020-08-23 NOTE — Telephone Encounter (Signed)
Oral Oncology Patient Advocate Encounter   Was successful in securing patient an $38,700 grant from [facility] [name]) to provide copayment coverage for Imbruvica.  This will keep the out of pocket expense at $0.     Will speak to patient at her appointment tomorrow regarding approval of grant.  The billing information is as follows and has been shared with [place].   Member ID: [id number] Group ID: [id number] RxBin: [id number] Dates of Eligibility: 05/25/20 through 08/22/21  Fund:  Chronic Lymphocytic [place] Patient [place] Phone [phone] Fax [phone] 08/23/2020 3:41 PM

## 2020-08-23 NOTE — Progress Notes (Signed)
Patient called for pre assessment. States she would just like to know what the provider's next steps following her treatment. Denies any pain or other concerns at this time.

## 2020-08-23 NOTE — Telephone Encounter (Signed)
Oral Oncology Patient Advocate Encounter  Was successful in securing patient a $8,000 grant from [employer] to provide copayment coverage for Imbruvica.  This will keep the out of pocket expense at $0.     [employer] ID: [id number]  I have spoken with the patient.   The billing information is as follows and has been shared with [place].    RxBin: [id number] PCN: [id number] Member ID: [id number] Group ID: [id number] Dates of Eligibility: 07/24/20 through 07/23/21  Fund:  Chronic Lymphocytic [place] Patient [place] Phone [phone] Fax [phone] 08/23/2020 3:49 PM

## 2020-08-23 NOTE — Progress Notes (Signed)
Cardiology Office Note   Date:  08/23/2020   ID:  Megan Santana, DOB [birth year], MRN [id number]  PCP:  Megan Jude, MD  Cardiologist:   Megan Sacramento, MD   Chief Complaint  Patient presents with  . New Patient (Initial Visit)    Referred by Dr. Grayland Santana. Patient reports hypotensive episode during initiation of new cancer treatment; Meds verbally reviewed with patient.      History of Present Illness: Megan Santana is a 86 y.o. female who was referred by Dr. Grayland Santana for evaluation of shortness of breath and an abnormal EKG. She has history of breast cancer status post right lumpectomy, radiation and chemotherapy.  She also has history of arthritis but no other chronic medical conditions such as diabetes or hypertension.  She is not a smoker. She is currently dealing with rapidly progressive CLL.  She was recently started on Rituxan infusion but shortly after initiation she developed chills and hypotension thought to have an infusion reaction.  She was given Solu-Medrol, Benadryl and Pepcid and was sent to the ED.  She improved there quickly.  Initial troponin was normal but the second troponin was mildly elevated at 28.  The patient did complain of some chest and abdominal discomfort during that time but that has resolved.  She does complain of significant exertional dyspnea and fatigue. She has been told about the heart murmur with no recent evaluation.  EKG showed sinus rhythm with poor R wave progression in the anterior leads.  No orthopnea, PND or leg edema.  Past Medical History:  Diagnosis Date  . Arthritis   . Breast cancer [facility] [place]) 2007   right lumpectomy  . Breast cancer ([place]) 03/31/2017   16 mm invasive mammary carcinoma, left upper outer quadrant, T1c, N0, triple negative, histologic grade 3.  . Family history of adverse reaction to anesthesia    mom n/v  . GERD (gastroesophageal reflux disease)   . Heart murmur   . Lumbar radiculitis   . Lumbar stenosis   .  Personal history of chemotherapy 2018   Left  . Personal history of radiation therapy 2007   Right  . Personal history of radiation therapy 2018   Left    Past Surgical History:  Procedure Laterality Date  . ABDOMINAL HYSTERECTOMY     total  . BACK SURGERY     lumbar  . BREAST BIOPSY Bilateral 03/31/2017   bilat u/s core INVASIVE MAMMARY CARCINOMA  . BREAST BIOPSY Right 03/31/2017   DENSE FIBROSIS AND SHEETS OF MACROPHAGES  . BREAST EXCISIONAL BIOPSY Left 2018  . BREAST LUMPECTOMY Right 2007  . BREAST LUMPECTOMY Left 04/23/2017  . BREAST LUMPECTOMY Left 04/23/2017   Procedure: BREAST LUMPECTOMY WITH EXCISION OF SENTINEL NODE;  Surgeon: Robert Bellow, MD;  Whole breast radiation ORS;  Service: General;  Laterality: Left;  . BREAST SURGERY Right    Wide excision  . EYE SURGERY     cataracts bil  . FRACTURE SURGERY Left 2015 or 2016  . TUMOR EXCISION  2001     Current Outpatient Medications  Medication Sig Dispense Refill  . acetaminophen (TYLENOL) 500 MG tablet Take 1,000 mg by mouth every 6 (six) hours as needed (for pain.).    Marland Kitchen allopurinol (ZYLOPRIM) 300 MG tablet Take 1 tablet (300 mg total) by mouth daily. 60 tablet 1  . ALPRAZolam (XANAX) 0.25 MG tablet Take 1 tablet (0.25 mg total) by mouth at bedtime as needed for anxiety. and sleep. 30 tablet  0  . Calcium Carb-Cholecalciferol (CALCIUM 600+D) 600-800 MG-UNIT TABS Take 1 tablet by mouth every morning.     . calcium carbonate (TUMS EX) 750 MG chewable tablet Chew 1-2 tablets by mouth 3 (three) times daily as needed (for heartburn/indigestion).    . cholecalciferol (VITAMIN D3) 25 MCG (1000 UNIT) tablet Take 1,000 Units by mouth daily.    . cyclobenzaprine (FLEXERIL) 10 MG tablet Take 1 tablet (10 mg total) by mouth 3 (three) times daily as needed for muscle spasms. 60 tablet 0  . loratadine (CLARITIN) 10 MG tablet Take 10 mg by mouth in the morning and at bedtime.     . magnesium oxide (MAG-OX) 400 MG tablet Take 250  mg by mouth daily.     . Multiple Vitamins-Minerals (CENTRUM SILVER 50+WOMEN) TABS Take 1 tablet by mouth daily.    . Potassium 99 MG TABS Take 99 mg by mouth daily.     . vitamin C (ASCORBIC ACID) 500 MG tablet Take 500 mg by mouth daily.     No current facility-administered medications for this visit.    Allergies:   Oxycodone    Social History:  The patient  reports that she quit smoking about 51 years ago. Her smoking use included cigarettes. She has a 2.50 pack-year smoking history. She has never used smokeless tobacco. She reports current alcohol use. She reports that she does not use drugs.   Family History:  The patient's family history includes Breast cancer in her maternal aunt; Breast cancer (age of onset: 27) in her sister; Heart attack in her brother; Lung cancer (age of onset: 50) in her father; Lung cancer (age of onset: 50) in her mother; Stroke (age of onset: 36) in her sister.    ROS:  Please see the history of present illness.   Otherwise, review of systems are positive for none.   All other systems are reviewed and negative.    PHYSICAL EXAM: VS:  BP 106/62 (BP Location: Right Arm, Patient Position: Sitting, Cuff Size: Normal)   Pulse 74   Ht 5\' 2"  (1.575 m)   Wt 165 lb (74.8 kg)   SpO2 98%   BMI 30.18 kg/m  , BMI Body mass index is 30.18 kg/m. GEN: Well nourished, well developed, in no acute distress  HEENT: normal  Neck: no JVD, carotid bruits, or masses Cardiac: RRR; no  rubs, or gallops,no edema .  1 out of 6 systolic murmur in the aortic area. Respiratory:  clear to auscultation bilaterally, normal work of breathing GI: soft, nontender, nondistended, + BS MS: no deformity or atrophy  Skin: warm and dry, no rash Neuro:  Strength and sensation are intact Psych: euthymic mood, full affect   EKG:  EKG is not ordered today. I reviewed recent EKGs that were done and showed sinus rhythm with poor R wave progression in the anterior leads.   Recent  Labs: 08/17/2020: ALT 22; BUN 25; Creatinine, Ser 1.33; Hemoglobin 10.4; Magnesium 2.1; Platelets 101; Potassium 4.8; Sodium 138    Lipid Panel No results found for: CHOL, TRIG, HDL, CHOLHDL, VLDL, LDLCALC, LDLDIRECT    Wt Readings from Last 3 Encounters:  08/23/20 165 lb (74.8 kg)  08/17/20 150 lb (68 kg)  08/17/20 166 lb 4.8 oz (75.4 kg)       PAD Screen 08/23/2020  Previous PAD dx? No  Previous surgical procedure? No  Pain with walking? Yes  Subsides with rest? Yes  Feet/toe relief with dangling? Yes  Painful, non-healing ulcers?  No  Extremities discolored? No      ASSESSMENT AND PLAN:  1.  Mildly elevated troponin: In the setting of what seems to be an infusion reaction with mild hypotension.  Likely supply demand ischemia but her EKG is slightly abnormal with poor R wave progression in the precordial leads and the patient has significant exertional dyspnea.  Due to that, I recommend evaluation with a Lexiscan Myoview.  2.  Dyspnea: The patient also has a heart murmur which seems to be suggestive of aortic sclerosis.  I requested an echocardiogram to evaluate EF, diastolic function, valvular structure and pulmonary pressure.  3.  CLL: Managed by oncology.   Disposition:   FU with me as needed.  Signed,  Megan Sacramento, MD  08/23/2020 10:41 AM    [place]

## 2020-08-23 NOTE — Patient Instructions (Signed)
Medication Instructions:  Your physician recommends that you continue on your current medications as directed. Please refer to the Current Medication list given to you today.  *If you need a refill on your cardiac medications before your next appointment, please call your pharmacy*   Lab Work: None ordered If you have labs (blood work) drawn today and your tests are completely normal, you will receive your results only by: Marland Kitchen MyChart Message (if you have MyChart) OR . A paper copy in the mail If you have any lab test that is abnormal or we need to change your treatment, we will call you to review the results.   Testing/Procedures: Your physician has requested that you have an echocardiogram. Echocardiography is a painless test that uses sound waves to create images of your heart. It provides your doctor with information about the size and shape of your heart and how well your heart's chambers and valves are working. This procedure takes approximately one hour. There are no restrictions for this procedure.  Your physician has requested that you have a lexiscan myoview. For further information please visit [email or web address]. Please follow instruction sheet, as given. (To be scheduled asap)    Follow-Up: At [facility], you and your health needs are our priority.  As part of our continuing mission to provide you with exceptional heart care, we have created designated Provider Care Teams.  These Care Teams include your primary Cardiologist (physician) and Advanced Practice Providers (APPs -  Physician Assistants and Nurse Practitioners) who all work together to provide you with the care you need, when you need it.  We recommend signing up for the patient portal called "MyChart".  Sign up information is provided on this After Visit Summary.  MyChart is used to connect with patients for Virtual Visits (Telemedicine).  Patients are able to view lab/test results, encounter notes, upcoming  appointments, etc.  Non-urgent messages can be sent to your provider as well.   To learn more about what you can do with MyChart, go to [email or web address].    Your next appointment:   As needed   The format for your next appointment:   In Person  Provider:    You may see Dr. Fletcher Anon or one of the following Advanced Practice Providers on your designated Care Team:    Murray Hodgkins, NP  Christell Faith, PA-C  Marrianne Mood, PA-C    Other Instructions [place]  Your caregiver has ordered a Stress Test with nuclear imaging. The purpose of this test is to evaluate the blood supply to your heart muscle. This procedure is referred to as a "Non-Invasive Stress Test." This is because other than having an IV started in your vein, nothing is inserted or "invades" your body. Cardiac stress tests are done to find areas of poor blood flow to the heart by determining the extent of coronary artery disease (CAD). Some patients exercise on a treadmill, which naturally increases the blood flow to your heart, while others who are  unable to walk on a treadmill due to physical limitations have a pharmacologic/chemical stress agent called Lexiscan . This medicine will mimic walking on a treadmill by temporarily increasing your coronary blood flow.   Please note: these test may take anywhere between 2-4 hours to complete  PLEASE REPORT TO [place] AT THE FIRST DESK WILL DIRECT YOU WHERE TO GO  Date of Procedure:_____________________________________  Arrival Time for Procedure:______________________________   PLEASE NOTIFY THE OFFICE AT LEAST 24 HOURS  IN ADVANCE IF YOU ARE UNABLE TO KEEP YOUR APPOINTMENT.  [phone] AND  PLEASE NOTIFY NUCLEAR MEDICINE AT [facility] AT LEAST 24 HOURS IN ADVANCE IF YOU ARE UNABLE TO KEEP YOUR APPOINTMENT. [phone]  How to prepare for your Myoview test:  1. Do not eat or drink after midnight 2. No caffeine for 24 hours prior  to test 3. No smoking 24 hours prior to test. 4. Your medication may be taken with water.  If your doctor stopped a medication because of this test, do not take that medication. 5. Ladies, please do not wear dresses.  Skirts or pants are appropriate. Please wear a short sleeve shirt. 6. No perfume, cologne or lotion. 7. Wear comfortable walking shoes. No heels!

## 2020-08-24 ENCOUNTER — Telehealth: Payer: Self-pay | Admitting: Pharmacist

## 2020-08-24 ENCOUNTER — Encounter: Payer: Self-pay | Admitting: Oncology

## 2020-08-24 ENCOUNTER — Inpatient Hospital Stay: Payer: Medicare HMO

## 2020-08-24 ENCOUNTER — Other Ambulatory Visit: Payer: Self-pay | Admitting: Oncology

## 2020-08-24 ENCOUNTER — Inpatient Hospital Stay (HOSPITAL_BASED_OUTPATIENT_CLINIC_OR_DEPARTMENT_OTHER): Payer: Medicare HMO | Admitting: Oncology

## 2020-08-24 VITALS — BP 143/97 | HR 72 | Temp 98.7°F | Wt 166.5 lb

## 2020-08-24 DIAGNOSIS — C9110 Chronic lymphocytic leukemia of B-cell type not having achieved remission: Secondary | ICD-10-CM

## 2020-08-24 DIAGNOSIS — Z5112 Encounter for antineoplastic immunotherapy: Secondary | ICD-10-CM | POA: Diagnosis not present

## 2020-08-24 LAB — CBC WITH DIFFERENTIAL/PLATELET
Abs Immature Granulocytes: 0.98 10*3/uL — ABNORMAL HIGH (ref 0.00–0.07)
Basophils Absolute: 0.2 10*3/uL — ABNORMAL HIGH (ref 0.0–0.1)
Basophils Relative: 0 %
Eosinophils Absolute: 0.3 10*3/uL (ref 0.0–0.5)
Eosinophils Relative: 0 %
HCT: 33.1 % — ABNORMAL LOW (ref 36.0–46.0)
Hemoglobin: 10.9 g/dL — ABNORMAL LOW (ref 12.0–15.0)
Immature Granulocytes: 1 %
Lymphocytes Relative: 78 %
Lymphs Abs: 59.2 10*3/uL — ABNORMAL HIGH (ref 0.7–4.0)
MCH: 29.8 pg (ref 26.0–34.0)
MCHC: 32.9 g/dL (ref 30.0–36.0)
MCV: 90.4 fL (ref 80.0–100.0)
Monocytes Absolute: 11.3 10*3/uL — ABNORMAL HIGH (ref 0.1–1.0)
Monocytes Relative: 15 %
Neutro Abs: 4.2 10*3/uL (ref 1.7–7.7)
Neutrophils Relative %: 6 %
Platelets: 181 10*3/uL (ref 150–400)
RBC: 3.66 MIL/uL — ABNORMAL LOW (ref 3.87–5.11)
RDW: 15.3 % (ref 11.5–15.5)
Smear Review: ADEQUATE
WBC Morphology: ABNORMAL
WBC: 76.1 10*3/uL (ref 4.0–10.5)
nRBC: 0 % (ref 0.0–0.2)

## 2020-08-24 LAB — COMPREHENSIVE METABOLIC PANEL
ALT: 21 U/L (ref 0–44)
AST: 17 U/L (ref 15–41)
Albumin: 3.9 g/dL (ref 3.5–5.0)
Alkaline Phosphatase: 62 U/L (ref 38–126)
Anion gap: 11 (ref 5–15)
BUN: 35 mg/dL — ABNORMAL HIGH (ref 8–23)
CO2: 22 mmol/L (ref 22–32)
Calcium: 9.5 mg/dL (ref 8.9–10.3)
Chloride: 105 mmol/L (ref 98–111)
Creatinine, Ser: 1.27 mg/dL — ABNORMAL HIGH (ref 0.44–1.00)
GFR calc Af Amer: 45 mL/min — ABNORMAL LOW (ref >60)
GFR calc non Af Amer: 39 mL/min — ABNORMAL LOW (ref >60)
Glucose, Bld: 95 mg/dL (ref 70–99)
Potassium: 4.9 mmol/L (ref 3.5–5.1)
Sodium: 138 mmol/L (ref 135–145)
Total Bilirubin: 0.6 mg/dL (ref 0.3–1.2)
Total Protein: 6.8 g/dL (ref 6.5–8.1)

## 2020-08-24 LAB — MAGNESIUM: Magnesium: 2.1 mg/dL (ref 1.7–2.4)

## 2020-08-24 LAB — PHOSPHORUS: Phosphorus: 4.1 mg/dL (ref 2.5–4.6)

## 2020-08-24 MED ORDER — HYDROCODONE-ACETAMINOPHEN 5-325 MG PO TABS
1.0000 | ORAL_TABLET | Freq: Four times a day (QID) | ORAL | 0 refills | Status: DC | PRN
Start: 2020-08-24 — End: 2021-02-19

## 2020-08-24 MED ORDER — IBRUTINIB 420 MG PO TABS: 420.0000 mg | ORAL_TABLET | Freq: Every day | ORAL | 2 refills | Status: DC

## 2020-08-24 NOTE — Telephone Encounter (Signed)
Oral Chemotherapy Pharmacist Encounter  Patient Education I spoke with patient for overview of new oral chemotherapy medication: Imbruvica (ibrutinib) for the treatment of CLL with 13q deletion, planned duration until disease progression or unacceptable drug toxicity.   Counseled patient on administration, dosing, side effects, monitoring, drug-food interactions, safe handling, storage, and disposal. Patient will take 420 mg by mouth daily.  Side effects include but not limited to: diarrhea, fatigue, decreased wbc/plt/hgb, N/V.    Reviewed with patient importance of keeping a medication schedule and plan for any missed doses.  After discussion with patient no patient barriers to medication adherence identified. Patient's daughter helps her manage her medications.  Megan Santana voiced understanding and appreciation. All questions answered. Medication handout placed in the mail.  Provided patient with [facility] phone number. Patient knows to call the office with questions or concerns. Oral Chemotherapy Navigation [facility] will continue to follow.  Darl Pikes, PharmD, BCPS, BCOP, CPP Hematology/Oncology Clinical Pharmacist Practitioner [facility]/HP/AP [facility] [phone]  08/24/2020 3:27 PM

## 2020-08-27 ENCOUNTER — Other Ambulatory Visit: Payer: Self-pay

## 2020-08-27 ENCOUNTER — Encounter
Admission: RE | Admit: 2020-08-27 | Discharge: 2020-08-27 | Disposition: A | Discharge status: Home / Self Care | Payer: Medicare HMO | Source: Ambulatory Visit | Attending: Cardiovascular Disease | Admitting: Cardiovascular Disease

## 2020-08-27 DIAGNOSIS — R9431 Abnormal electrocardiogram [ECG] [EKG]: Secondary | ICD-10-CM | POA: Insufficient documentation

## 2020-08-27 DIAGNOSIS — R0602 Shortness of breath: Secondary | ICD-10-CM

## 2020-08-27 LAB — NM MYOCAR MULTI W/SPECT W/WALL MOTION / EF
LV dias vol: 47 mL (ref 46–106)
LV sys vol: 22 mL
Peak HR: 108 {beats}/min
Percent HR: 78 %
Rest HR: 75 {beats}/min
SDS: 1
SRS: 0
SSS: 0
TID: 1.12

## 2020-08-27 MED ORDER — REGADENOSON 0.4 MG/5ML IV SOLN
0.4000 mg | Freq: Once | INTRAVENOUS | Status: AC
  Administered 2020-08-27: 0.4 mg via INTRAVENOUS

## 2020-08-27 MED ORDER — TECHNETIUM TC 99M TETROFOSMIN IV KIT
32.7700 | PACK | Freq: Once | INTRAVENOUS | Status: AC | PRN
  Administered 2020-08-27: 32.77 via INTRAVENOUS

## 2020-08-27 MED ORDER — TECHNETIUM TC 99M TETROFOSMIN IV KIT
10.6900 | PACK | Freq: Once | INTRAVENOUS | Status: AC | PRN
  Administered 2020-08-27: 10.69 via INTRAVENOUS

## 2020-08-27 MED FILL — IMBRUVICA 420 MG TAB: 420 | 28 days supply | Qty: 28 | Fill #0

## 2020-08-30 ENCOUNTER — Telehealth: Payer: Self-pay | Admitting: *Deleted

## 2020-08-30 NOTE — Telephone Encounter (Signed)
Patient called reporting that she was told by Dr Grayland Ormond not to go on the trip she had planned. She is in need of a letter on our letter head stating that she has to cancel due to her specific medical condition for the airline to refund her money and would like to be called when this is done so that she can pick it up. [phone].

## 2020-08-30 NOTE — Telephone Encounter (Signed)
Letter for patient has been typed and printed. Patient notified. She states she or her son will be picking up letter and to leave at front desk of cancer center.

## 2020-08-30 NOTE — Telephone Encounter (Signed)
That's fine

## 2020-09-07 ENCOUNTER — Encounter: Payer: Self-pay | Admitting: Oncology

## 2020-09-07 NOTE — Progress Notes (Signed)
[place]  [phone] [phone]  ID: Megan Santana OB: [birth year]  MR#: [id number]  GUR#:[id number]  Patient Care Team: Lynnell Jude, MD as PCP - General (Family Medicine) Rico Junker, RN as Oncology Nurse Navigator Grayland Ormond, Kathlene November, MD as Consulting Physician (Oncology) Clemmie Krill Lynnell Jude, MD as Referring Physician (Family Medicine) Bary Castilla, Forest Gleason, MD (General Surgery) Noreene Filbert, MD as Referring Physician (Radiation Oncology)   CHIEF COMPLAINT:  CLL, q13 deletion  INTERVAL HISTORY: Patient returns to clinic today for repeat laboratory work, further evaluation, and to assess her toleration of Imbruvica.  She currently feels well.  She is tolerating treatment well without significant side effects. Her anxiety and insomnia have improved.  She continues to have mild left flank pain secondary to her splenomegaly.  She has no neurologic complaints. She denies any recent fevers or illnesses.  She denies any night sweats or unintentional weight loss.  She has no chest pain, shortness of breath, cough, or hemoptysis.  She denies any nausea, vomiting, constipation, or diarrhea. She has no urinary complaints.  Patient offers no further specific complaints today.  REVIEW OF SYSTEMS:   Review of Systems  Constitutional: Negative.  Negative for fever, malaise/fatigue and weight loss.  Respiratory: Negative.  Negative for cough, hemoptysis and shortness of breath.   Cardiovascular: Negative.  Negative for chest pain and leg swelling.  Gastrointestinal: Negative.  Negative for abdominal pain and constipation.  Genitourinary: Positive for flank pain. Negative for dysuria.  Musculoskeletal: Negative for back pain and joint pain.  Skin: Negative.  Negative for rash.  Neurological: Negative.  Negative for sensory change, focal weakness, weakness and headaches.  Psychiatric/Behavioral: Negative.  The patient is not nervous/anxious and does not  have insomnia.     As per HPI. Otherwise, a complete review of systems is negative.  PAST MEDICAL HISTORY: Past Medical History:  Diagnosis Date   Arthritis    Breast cancer ([place]) 2007   right lumpectomy   Breast cancer ([place]) 03/31/2017   16 mm invasive mammary carcinoma, left upper outer quadrant, T1c, N0, triple negative, histologic grade 3.   Family history of adverse reaction to anesthesia    mom n/v   GERD (gastroesophageal reflux disease)    Heart murmur    Lumbar radiculitis    Lumbar stenosis    Personal history of chemotherapy 2018   Left   Personal history of radiation therapy 2007   Right   Personal history of radiation therapy 2018   Left    PAST SURGICAL HISTORY: Past Surgical History:  Procedure Laterality Date   ABDOMINAL HYSTERECTOMY     total   BACK SURGERY     lumbar   BREAST BIOPSY Bilateral 03/31/2017   bilat u/s core INVASIVE MAMMARY CARCINOMA   BREAST BIOPSY Right 03/31/2017   DENSE FIBROSIS AND SHEETS OF MACROPHAGES   BREAST EXCISIONAL BIOPSY Left 2018   BREAST LUMPECTOMY Right 2007   BREAST LUMPECTOMY Left 04/23/2017   BREAST LUMPECTOMY Left 04/23/2017   Procedure: BREAST LUMPECTOMY WITH EXCISION OF SENTINEL NODE;  Surgeon: Robert Bellow, MD;  Whole breast radiation ORS;  Service: General;  Laterality: Left;   BREAST SURGERY Right    Wide excision   EYE SURGERY     cataracts bil   FRACTURE SURGERY Left 2015 or 2016   TUMOR EXCISION  2001    FAMILY HISTORY: Family History  Problem Relation Age of Onset   Breast cancer Sister 7   Lung cancer  Mother 69       mets to breast   Lung cancer Father 45   Heart attack Brother    Breast cancer Maternal Aunt        age unknown   Stroke Sister 64    ADVANCED DIRECTIVES (Y/N):  N  HEALTH MAINTENANCE: Social History   Tobacco Use   Smoking status: Former Smoker    Packs/day: 0.25    Years: 10.00    Pack years: 2.50    Types: Cigarettes    Quit date:  1970    Years since quitting: 51.7   Smokeless tobacco: Never Used  Scientific laboratory technician Use: Never used  Substance Use Topics   Alcohol use: Yes    Comment: occasionally   Drug use: No     Colonoscopy:  PAP:  Bone density:  Lipid panel:  Allergies  Allergen Reactions   Oxycodone Other (See Comments)    Can't sleep, feels bad when taking.    Current Outpatient Medications  Medication Sig Dispense Refill   acetaminophen (TYLENOL) 500 MG tablet Take 1,000 mg by mouth every 6 (six) hours as needed (for pain.).     allopurinol (ZYLOPRIM) 300 MG tablet Take 1 tablet (300 mg total) by mouth daily. 60 tablet 1   ALPRAZolam (XANAX) 0.25 MG tablet Take 1 tablet (0.25 mg total) by mouth at bedtime as needed for anxiety. and sleep. 30 tablet 0   Calcium Carb-Cholecalciferol (CALCIUM 600+D) 600-800 MG-UNIT TABS Take 1 tablet by mouth every morning.      calcium carbonate (TUMS EX) 750 MG chewable tablet Chew 1-2 tablets by mouth 3 (three) times daily as needed (for heartburn/indigestion).     cholecalciferol (VITAMIN D3) 25 MCG (1000 UNIT) tablet Take 1,000 Units by mouth daily.     Ibrutinib 420 MG TABS Take 420 mg by mouth daily. 28 tablet 2   loratadine (CLARITIN) 10 MG tablet Take 10 mg by mouth in the morning and at bedtime.      magnesium oxide (MAG-OX) 400 MG tablet Take 250 mg by mouth daily.      Multiple Vitamins-Minerals (CENTRUM SILVER 50+WOMEN) TABS Take 1 tablet by mouth daily.     Potassium 99 MG TABS Take 99 mg by mouth daily.      vitamin C (ASCORBIC ACID) 500 MG tablet Take 500 mg by mouth daily.     cyclobenzaprine (FLEXERIL) 10 MG tablet Take 1 tablet (10 mg total) by mouth 3 (three) times daily as needed for muscle spasms. (Patient not taking: Reported on 09/07/2020) 60 tablet 0   HYDROcodone-acetaminophen (NORCO) 5-325 MG tablet Take 1 tablet by mouth every 6 (six) hours as needed for moderate pain. (Patient not taking: Reported on 09/07/2020) 30 tablet  0   No current facility-administered medications for this visit.    OBJECTIVE: Vitals:   09/10/20 0958  BP: (!) 142/76  Pulse: (!) 53  Resp: 20  Temp: 98.2 F (36.8 C)  SpO2: 100%     Body mass index is 30.29 kg/m.    ECOG FS:0 - Asymptomatic  General: Well-developed, well-nourished, no acute distress. Eyes: Pink conjunctiva, anicteric sclera. HEENT: Normocephalic, moist mucous membranes. Lungs: No audible wheezing or coughing. Heart: Regular rate and rhythm. Abdomen: Soft, nontender, no obvious distention. Musculoskeletal: No edema, cyanosis, or clubbing. Neuro: Alert, answering all questions appropriately. Cranial nerves grossly intact. Skin: No rashes or petechiae noted. Psych: Normal affect.  LAB RESULTS:  Lab Results  Component Value Date   NA  139 09/10/2020   K 4.4 09/10/2020   CL 107 09/10/2020   CO2 26 09/10/2020   GLUCOSE 96 09/10/2020   BUN 19 09/10/2020   CREATININE 0.97 09/10/2020   CALCIUM 9.1 09/10/2020   PROT 6.6 09/10/2020   ALBUMIN 4.0 09/10/2020   AST 18 09/10/2020   ALT 15 09/10/2020   ALKPHOS 45 09/10/2020   BILITOT 0.7 09/10/2020   GFRNONAA 54 (L) 09/10/2020   GFRAA >60 09/10/2020    Lab Results  Component Value Date   WBC 37.9 (H) 09/10/2020   NEUTROABS 1.5 (L) 09/10/2020   HGB 9.9 (L) 09/10/2020   HCT 30.5 (L) 09/10/2020   MCV 93.3 09/10/2020   PLT 172 09/10/2020     STUDIES: DG Chest 2 View  Result Date: 08/17/2020 CLINICAL DATA:  Chest pain.  History breast cancer.  CLL. EXAM: CHEST - 2 VIEW COMPARISON:  08/13/2020 FINDINGS: Heart size normal. Mild atherosclerotic calcification in the aortic arch. Negative for heart failure. Right paratracheal soft tissue thickening compatible with adenopathy unchanged. Streaky markings in the lung bases similar to the prior study. No acute infiltrate. No significant pleural fluid. IMPRESSION: Mild streaky atelectasis in the lung bases unchanged. No new infiltrate. Electronically Signed   By:  Franchot Gallo M.D.   On: 08/17/2020 14:11   DG Chest 2 View  Result Date: 08/13/2020 CLINICAL DATA:  Left chest pain. History of breast cancer and leukemia EXAM: CHEST - 2 VIEW COMPARISON:  08/07/2020 FINDINGS: Right paratracheal adenopathy unchanged from the prior study. No other adenopathy Heart size normal.  Vascularity normal.  Atherosclerotic aortic arch Decreased lung volume with bibasilar atelectasis which has developed in the interval. No effusion. IMPRESSION: Right paratracheal adenopathy is unchanged. CT chest with contrast recommended for further evaluation. Hypoventilation with bibasilar atelectasis. Electronically Signed   By: Franchot Gallo M.D.   On: 08/13/2020 10:50   CT SOFT TISSUE NECK W CONTRAST  Result Date: 08/14/2020 CLINICAL DATA:  History of bilateral breast cancer. Right sided neck pain and neck mass. History of CLL. EXAM: CT NECK WITH CONTRAST TECHNIQUE: Multidetector CT imaging of the neck was performed using the standard protocol following the bolus administration of intravenous contrast. CONTRAST:  61m OMNIPAQUE IOHEXOL 300 MG/ML  SOLN COMPARISON:  Chest CT done yesterday. FINDINGS: Pharynx and larynx: No evidence of mucosal or submucosal mass. Salivary glands: Parotid and submandibular glands are intrinsically normal. Bilateral parotid lymph nodes as below. Thyroid: Normal Lymph nodes: Enlargement lymph nodes throughout all nodal stations on both sides of the neck. Index nodes are as follows. Right parotid node image 40 measuring 8 x 15 mm. Right level 2 node image 45 measuring 19 x 14 mm. Right level 4 node image 56 measuring 15 x 11 mm. Left level 4 node image 69 measuring 19 x 13 mm. Vascular: No abnormal vascular finding. Limited intracranial: Normal Visualized orbits: Normal Mastoids and visualized paranasal sinuses: Clear Skeleton: Ordinary cervical spondylosis and facet arthropathy. Chronic calcified disc herniation C4-5. Upper chest: See results of chest CT. Other:  None IMPRESSION: Enlargement of lymph nodes throughout all nodal stations on both sides of the neck. Findings consistent with CLL. See results of index node measurements above. Electronically Signed   By: MNelson ChimesM.D.   On: 08/14/2020 20:34   CT CHEST ABDOMEN PELVIS W CONTRAST  Result Date: 08/13/2020 CLINICAL DATA:  Left flank and upper abdominal pain, beneath left breast since Friday. History of breast cancer and hysterectomy, history of CLL EXAM: CT CHEST, ABDOMEN, AND  PELVIS WITH CONTRAST TECHNIQUE: Multidetector CT imaging of the chest, abdomen and pelvis was performed following the standard protocol during bolus administration of intravenous contrast. CONTRAST:  37m OMNIPAQUE IOHEXOL 300 MG/ML  SOLN COMPARISON:  CT abdomen pelvis 01/06/2008, chest radiograph 08/13/2020 FINDINGS: CT CHEST FINDINGS Cardiovascular: Cardiac size within normal limits. Trace pericardial fluid, likely upper limits normal. Three-vessel coronary artery calcifications. Calcifications of the mitral annulus and aortic leaflets. Central pulmonary arteries are normal caliber. No large central or lobar pulmonary artery emboli on this non tailored examination. Atherosclerotic plaque within the normal caliber aorta. Normal 3 vessel branching of the aortic arch. Mild tortuosity of brachiocephalic vessels partially displaced by the mediastinal adenopathy/mass. Mediastinum/Nodes: There is extensive mediastinal and hilar adenopathy as well as a larger middle mediastinal soft tissue density/paratracheal mass measuring up to 4.8 by 5.1 by 5.1 cm in maximum AP by transverse by craniocaudal dimensions. Several larger nodes for reference include: Subpectoral left axillary node measuring up to 1.8 cm short axis (2/6) Subpectoral right axillary node measuring up to 2.3 cm short axis (2/14) AP window node measuring up to 11 mm (2/21) Minimal secretions in the trachea. No significant narrowing, occlusions or other acute abnormality. Mild lateral  displacement of the esophagus by the mediastinal tissue. Small sliding-type hiatal hernia. Normal thyroid. Lungs/Pleura: Some reticular bandlike opacities in the lung bases likely reflecting a combination of scarring and architectural distortion as well as subsegmental atelectatic change. Dependent atelectasis posteriorly. No concerning pulmonary nodules or masses. Mild biapical pleuroparenchymal scarring. No consolidation, features of edema, pneumothorax, or effusion. Musculoskeletal: Likely postsurgical changes from prior lumpectomy with a rounded intermediate attenuation structure measuring approximately 3.5 x 3.6 cm in the right breast (2/34) along the chest wall with surrounding coarse calcification possibly reflecting sequela of prior fat necrosis. Fairly circumscribed region of ground-glass density within the left breast measuring approximately 2.8 x 1.8 cm in size (2/26) could reflect prior fat necrosis as well. CT ABDOMEN PELVIS FINDINGS Hepatobiliary: No worrisome focal liver abnormality is seen. Normal gallbladder. No visible calcified gallstones. No biliary ductal dilatation. Pancreas: Unremarkable. No pancreatic ductal dilatation or surrounding inflammatory changes. Spleen: Marked splenomegaly. Minimal stranding towards the splenic hilum, nonspecific. Adrenals/Urinary Tract: No concerning adrenal lesions. Kidneys enhance and excrete symmetrically. Numerous subcentimeter hypoattenuating foci in both kidneys, statistically likely benign though should be viewed with some suspicion followed on comparison imaging given the patient's history of malignancy. Mild bilateral perinephric stranding is chronic and similar to comparison. No urolithiasis or hydronephrosis. Urinary bladder is largely decompressed at the time of exam and therefore poorly evaluated by CT imaging. No gross bladder abnormality. Stomach/Bowel: Sliding-type hiatal hernia, as above. Serpiginous hyperenhancement along the greater curvature  with numerous vascular collaterals, could reflect gastric varices. Which could be better evaluated with endoscopy. Duodenum is unremarkable. No small bowel thickening or dilatation. Portion of the transverse colon protrudes into a fat and bowel containing umbilical hernia with a 2.9 by 2.9 cm fascial defect and no convincing evidence of obstruction or strangulation at this time. No resulting mechanical obstruction. Scattered colonic diverticula without focal inflammation to suggest diverticulitis. No discernible colonic dilatation or significant wall thickening. Vascular/Lymphatic: Upper abdominal venous collaterals, as above. Atherosclerotic calcifications within the abdominal aorta and branch vessels. No aneurysm or ectasia. Multiple enlarged nodes are seen in the abdomen and pelvis. These include: Aortocaval 14 mm node (2/64) Portacaval 18 mm node (2/62) Prominent inguinal nodes including a 13 mm right inguinal lymph node (2/106) Conspicuous though subcentimeter midmesenteric nodes but which are  enlarged from comparison studies. Reproductive: Uterus is surgically absent. No concerning adnexal lesions. Other: Trace amount of thickening adjacent the enlarged spleen, nonspecific. No abdominopelvic free air or fluid. Fat and large bowel containing hernia without strangulation or obstruction, as above. Musculoskeletal: Scoliotic curvature of the thoracolumbar spine including a levocurvature apex T10 and a dextrocurvature apex L3. Multilevel degenerative changes are present in the imaged portions of the spine. Additional degenerative changes in the hips and pelvis. No acute or suspicious osseous lesions. IMPRESSION: 1. Extensive mediastinal, hilar, and axillary adenopathy as well as a larger middle mediastinal soft tissue conglomerate nodal mass measuring up to 4.8 cm. Findings are compatible with history of CLL. Correlate with laboratory findings. 2. Marked splenomegaly measuring up to 17 cm in length. Faint  surrounding stranding is nonspecific but could be indicative of some splenic congestion in the absence of traumatic findings or history. 3. Upper abdominal venous collateralization including gastric varices. Consider direct visualization on an outpatient basis. 4. Likely postsurgical changes of the right breast. Circumscribed fat in the left breast could reflect developing fat necrosis the appearance on comparison mammography (05/09/2020) indicated a suspicious appearance of the associated calcifications and further biopsy was recommended. 5. Fat and large bowel containing umbilical hernia without convincing evidence of obstruction or strangulation at this time. 6. Numerous subcentimeter hypoattenuating foci in both kidneys, statistically likely benign though should be viewed with some suspicion followed on comparison imaging given the patient's history of malignancy. 7. Colonic diverticulosis without evidence of diverticulitis. 8. Scoliotic curvature of the thoracolumbar spine. 9. Aortic Atherosclerosis (ICD10-I70.0). These results were called by telephone at the time of interpretation on 08/13/2020 at 3:49 pm to provider [facility] , who verbally acknowledged these results. Electronically Signed   By: Lovena Le M.D.   On: 08/13/2020 15:49   NM Myocar Multi W/Spect W/Wall Motion / EF  Result Date: 08/27/2020  There was no ST segment deviation noted during stress.  No T wave inversion was noted during stress.  The study is normal.  This is a low risk study.  The left ventricular ejection fraction is hyperdynamic (>65%).  CT attenuation images show mild coronary and aortic calcifications.     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 CLL, q13 deletion.  CT scan results reviewed independently and reported as above with significant, widespread lymphadenopathy as well as massive splenomegaly causing flank pain.  Patient had a reaction to Rituxan therefore this has been discontinued.  Patient was  initiated on 420 mg Imbruvica approximately 2 to 3 weeks ago and is tolerating treatment well.  Continue allopurinol as prescribed.  Return to clinic in 4 weeks for repeat laboratory work and then in 8 weeks for repeat laboratory work and further evaluation.  Appreciate clinical pharmacy input.  2. 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 This was likely a second primary.  Patient completed 4 cycles of Taxotere and Cytoxan August 06, 2017.  Also completed adjuvant XRT.  An aromatase inhibitor would not be of any benefit given the ER/PR status of her tumor.  Patient's most recent mammogram on May 09, 2020 was reported as BI-RADS 4.  Patient and her surgeon agreed to wait 6 months with for repeat mammogram in October 2021. 3. 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 Oncotype score 17:  Originally diagnosed in 2008. Patient completed 5 years of Arimidex in approximately August 2013. Biopsy of the right breast only revealed fat necrosis at the site of her previous MammoSite.  Mammogram and possible biopsy as above. 4. Genetic testing: Patient has a sister and mother both with breast cancer. 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 5.  Back pain: Chronic and unchanged.  Patient reports she is no longer taking Flexeril. 6.  Flank pain: Secondary to splenomegaly.  Imbruvica as above.  Continue Vicodin sparingly.   7.  Chest pain/shortness of breath: Resolved.  Continue follow-up with cardiology as indicated. 8.  Anxiety: Improved, continue Xanax as needed.   Patient expressed understanding and was in agreement with this plan. She also understands that She can call clinic at any time with any questions, concerns, or complaints.   Cancer Staging Carcinoma of  upper-outer quadrant of left breast in female, estrogen receptor negative ([place]) Staging form: Breast, AJCC 8th Edition - Clinical stage from 04/08/2017: Stage IB (cT1c, cN0, cM0, G3, ER: Negative, PR: Negative, HER2: Negative) - Signed by Lloyd Huger, MD on 04/08/2017 - Pathologic stage from 05/11/2017: Stage IB (pT1c, pN0, cM0, G3, ER: Negative, PR: Negative, HER2: Negative) - Signed by Lloyd Huger, MD on 05/11/2017   Lloyd Huger, MD   09/11/2020 6:16 AM

## 2020-09-07 NOTE — Progress Notes (Signed)
Patient states since last Thursday she has felt good and not felt this good in a long time. She denies any concerns. States she recently had test on her heart and would be having another one done this next week. Denies any issues. She denies any questions or concerns at this time.

## 2020-09-10 ENCOUNTER — Inpatient Hospital Stay: Payer: Medicare HMO

## 2020-09-10 ENCOUNTER — Other Ambulatory Visit: Payer: Self-pay

## 2020-09-10 ENCOUNTER — Inpatient Hospital Stay (HOSPITAL_BASED_OUTPATIENT_CLINIC_OR_DEPARTMENT_OTHER): Payer: Medicare HMO | Admitting: Oncology

## 2020-09-10 ENCOUNTER — Encounter: Payer: Self-pay | Admitting: Oncology

## 2020-09-10 ENCOUNTER — Inpatient Hospital Stay: Payer: Medicare HMO | Admitting: Pharmacist

## 2020-09-10 VITALS — BP 142/76 | HR 53 | Temp 98.2°F | Resp 20 | Ht 62.0 in | Wt 165.6 lb

## 2020-09-10 DIAGNOSIS — C9110 Chronic lymphocytic leukemia of B-cell type not having achieved remission: Secondary | ICD-10-CM

## 2020-09-10 DIAGNOSIS — Z5112 Encounter for antineoplastic immunotherapy: Secondary | ICD-10-CM | POA: Diagnosis not present

## 2020-09-10 LAB — COMPREHENSIVE METABOLIC PANEL
ALT: 15 U/L (ref 0–44)
AST: 18 U/L (ref 15–41)
Albumin: 4 g/dL (ref 3.5–5.0)
Alkaline Phosphatase: 45 U/L (ref 38–126)
Anion gap: 6 (ref 5–15)
BUN: 19 mg/dL (ref 8–23)
CO2: 26 mmol/L (ref 22–32)
Calcium: 9.1 mg/dL (ref 8.9–10.3)
Chloride: 107 mmol/L (ref 98–111)
Creatinine, Ser: 0.97 mg/dL (ref 0.44–1.00)
GFR calc Af Amer: >60 mL/min (ref >60)
GFR calc non Af Amer: 54 mL/min — ABNORMAL LOW (ref >60)
Glucose, Bld: 96 mg/dL (ref 70–99)
Potassium: 4.4 mmol/L (ref 3.5–5.1)
Sodium: 139 mmol/L (ref 135–145)
Total Bilirubin: 0.7 mg/dL (ref 0.3–1.2)
Total Protein: 6.6 g/dL (ref 6.5–8.1)

## 2020-09-10 LAB — CBC WITH DIFFERENTIAL/PLATELET
Abs Immature Granulocytes: 0 10*3/uL (ref 0.00–0.07)
Basophils Absolute: 0.4 10*3/uL — ABNORMAL HIGH (ref 0.0–0.1)
Basophils Relative: 1 %
Eosinophils Absolute: 0 10*3/uL (ref 0.0–0.5)
Eosinophils Relative: 0 %
HCT: 30.5 % — ABNORMAL LOW (ref 36.0–46.0)
Hemoglobin: 9.9 g/dL — ABNORMAL LOW (ref 12.0–15.0)
Lymphocytes Relative: 94 %
Lymphs Abs: 35.6 10*3/uL — ABNORMAL HIGH (ref 0.7–4.0)
MCH: 30.3 pg (ref 26.0–34.0)
MCHC: 32.5 g/dL (ref 30.0–36.0)
MCV: 93.3 fL (ref 80.0–100.0)
Monocytes Absolute: 0.4 10*3/uL (ref 0.1–1.0)
Monocytes Relative: 1 %
Neutro Abs: 1.5 10*3/uL — ABNORMAL LOW (ref 1.7–7.7)
Neutrophils Relative %: 4 %
Platelets: 172 10*3/uL (ref 150–400)
RBC: 3.27 MIL/uL — ABNORMAL LOW (ref 3.87–5.11)
RDW: 15.8 % — ABNORMAL HIGH (ref 11.5–15.5)
Smear Review: NORMAL
WBC Morphology: ABNORMAL
WBC: 37.9 10*3/uL — ABNORMAL HIGH (ref 4.0–10.5)
nRBC: 0 % (ref 0.0–0.2)

## 2020-09-10 LAB — MAGNESIUM: Magnesium: 2.1 mg/dL (ref 1.7–2.4)

## 2020-09-10 LAB — PHOSPHORUS: Phosphorus: 3.8 mg/dL (ref 2.5–4.6)

## 2020-09-10 NOTE — Progress Notes (Signed)
[place]  Telephone:([phone] [phone]  Patient Care Team: Lynnell Jude, MD as PCP - General (Family Medicine) Rico Junker, RN as Oncology Nurse Navigator Grayland Ormond, Kathlene November, MD as Consulting Physician (Oncology) Clemmie Krill Lynnell Jude, MD as Referring Physician (Family Medicine) Bary Castilla Forest Gleason, MD (General Surgery) Noreene Filbert, MD as Referring Physician (Radiation Oncology)   Name of the patient: Megan Santana  [id number]  30-Jan-1934   Date of visit: 09/10/20  HPI: Patient is a 86 y.o. female with CLL, 13q deletion. She was originally treated with rituximab, but had a reaction to her first infusion. She was then switched to treatment with Imbruvica (ibrutinib). Ibrutinib started on 08/28/20. Patient's daughter is present at the visit today.  Reason for Consult: Oral chemotherapy follow-up for ibrutinib therapy.   PAST MEDICAL HISTORY: Past Medical History:  Diagnosis Date  . Arthritis   . Breast cancer [facility]) 2007   right lumpectomy  . Breast cancer ([place]) 03/31/2017   16 mm invasive mammary carcinoma, left upper outer quadrant, T1c, N0, triple negative, histologic grade 3.  . Family history of adverse reaction to anesthesia    mom n/v  . GERD (gastroesophageal reflux disease)   . Heart murmur   . Lumbar radiculitis   . Lumbar stenosis   . Personal history of chemotherapy 2018   Left  . Personal history of radiation therapy 2007   Right  . Personal history of radiation therapy 2018   Left    PAST SURGICAL HISTORY:  Past Surgical History:  Procedure Laterality Date  . ABDOMINAL HYSTERECTOMY     total  . BACK SURGERY     lumbar  . BREAST BIOPSY Bilateral 03/31/2017   bilat u/s core INVASIVE MAMMARY CARCINOMA  . BREAST BIOPSY Right 03/31/2017   DENSE FIBROSIS AND SHEETS OF MACROPHAGES  . BREAST EXCISIONAL BIOPSY Left 2018  . BREAST LUMPECTOMY Right 2007  . BREAST LUMPECTOMY Left 04/23/2017    . BREAST LUMPECTOMY Left 04/23/2017   Procedure: BREAST LUMPECTOMY WITH EXCISION OF SENTINEL NODE;  Surgeon: Robert Bellow, MD;  Whole breast radiation ORS;  Service: General;  Laterality: Left;  . BREAST SURGERY Right    Wide excision  . EYE SURGERY     cataracts bil  . FRACTURE SURGERY Left 2015 or 2016  . TUMOR EXCISION  2001    HEMATOLOGY/ONCOLOGY HISTORY:  Oncology History  CLL (chronic lymphocytic leukemia) ([place])  02/10/2020 Initial Diagnosis   CLL (chronic lymphocytic leukemia) ([place])   08/17/2020 - 08/17/2020 Chemotherapy   The patient had riTUXimab-pvvr (RUXIENCE) 700 mg in sodium chloride 0.9 % 250 mL (2.1875 mg/mL) infusion, 375 mg/m2 = 700 mg, Intravenous,  Once, 1 of 4 cycles Administration: 700 mg (08/17/2020)  for chemotherapy treatment.      ALLERGIES:  is allergic to oxycodone.  MEDICATIONS:  Current Outpatient Medications  Medication Sig Dispense Refill  . acetaminophen (TYLENOL) 500 MG tablet Take 1,000 mg by mouth every 6 (six) hours as needed (for pain.).    Marland Kitchen allopurinol (ZYLOPRIM) 300 MG tablet Take 1 tablet (300 mg total) by mouth daily. 60 tablet 1  . ALPRAZolam (XANAX) 0.25 MG tablet Take 1 tablet (0.25 mg total) by mouth at bedtime as needed for anxiety. and sleep. 30 tablet 0  . Calcium Carb-Cholecalciferol (CALCIUM 600+D) 600-800 MG-UNIT TABS Take 1 tablet by mouth every morning.     . calcium carbonate (TUMS EX) 750 MG chewable tablet Chew 1-2 tablets by mouth 3 (  three) times daily as needed (for heartburn/indigestion).    . cholecalciferol (VITAMIN D3) 25 MCG (1000 UNIT) tablet Take 1,000 Units by mouth daily.    . cyclobenzaprine (FLEXERIL) 10 MG tablet Take 1 tablet (10 mg total) by mouth 3 (three) times daily as needed for muscle spasms. (Patient not taking: Reported on 09/07/2020) 60 tablet 0  . HYDROcodone-acetaminophen (NORCO) 5-325 MG tablet Take 1 tablet by mouth every 6 (six) hours as needed for moderate pain. (Patient not taking: Reported on  09/07/2020) 30 tablet 0  . Ibrutinib 420 MG TABS Take 420 mg by mouth daily. 28 tablet 2  . loratadine (CLARITIN) 10 MG tablet Take 10 mg by mouth in the morning and at bedtime.     . magnesium oxide (MAG-OX) 400 MG tablet Take 250 mg by mouth daily.     . Multiple Vitamins-Minerals (CENTRUM SILVER 50+WOMEN) TABS Take 1 tablet by mouth daily.    . Potassium 99 MG TABS Take 99 mg by mouth daily.     . vitamin C (ASCORBIC ACID) 500 MG tablet Take 500 mg by mouth daily.     No current facility-administered medications for this visit.    VITAL SIGNS: There were no vitals taken for this visit. There were no vitals filed for this visit.  Estimated body mass index is 30.45 kg/m as calculated from the following:   Height as of 08/23/20: 5\' 2"  (1.575 m).   Weight as of 08/24/20: 75.5 kg (166 lb 8 oz).  LABS: CBC:    Component Value Date/Time   WBC 76.1 (HH) 08/24/2020 0805   HGB 10.9 (L) 08/24/2020 0805   HCT 33.1 (L) 08/24/2020 0805   PLT 181 08/24/2020 0805   MCV 90.4 08/24/2020 0805   NEUTROABS 4.2 08/24/2020 0805   LYMPHSABS 59.2 (H) 08/24/2020 0805   MONOABS 11.3 (H) 08/24/2020 0805   EOSABS 0.3 08/24/2020 0805   BASOSABS 0.2 (H) 08/24/2020 0805   Comprehensive Metabolic Panel:    Component Value Date/Time   NA 138 08/24/2020 0805   K 4.9 08/24/2020 0805   CL 105 08/24/2020 0805   CO2 22 08/24/2020 0805   BUN 35 (H) 08/24/2020 0805   CREATININE 1.27 (H) 08/24/2020 0805   GLUCOSE 95 08/24/2020 0805   CALCIUM 9.5 08/24/2020 0805   AST 17 08/24/2020 0805   ALT 21 08/24/2020 0805   ALKPHOS 62 08/24/2020 0805   BILITOT 0.6 08/24/2020 0805   PROT 6.8 08/24/2020 0805   ALBUMIN 3.9 08/24/2020 0805    RADIOGRAPHIC STUDIES: DG Chest 2 View  Result Date: 08/17/2020 CLINICAL DATA:  Chest pain.  History breast cancer.  CLL. EXAM: CHEST - 2 VIEW COMPARISON:  08/13/2020 FINDINGS: Heart size normal. Mild atherosclerotic calcification in the aortic arch. Negative for heart failure.  Right paratracheal soft tissue thickening compatible with adenopathy unchanged. Streaky markings in the lung bases similar to the prior study. No acute infiltrate. No significant pleural fluid. IMPRESSION: Mild streaky atelectasis in the lung bases unchanged. No new infiltrate. Electronically Signed   By: Franchot Gallo M.D.   On: 08/17/2020 14:11   DG Chest 2 View  Result Date: 08/13/2020 CLINICAL DATA:  Left chest pain. History of breast cancer and leukemia EXAM: CHEST - 2 VIEW COMPARISON:  08/07/2020 FINDINGS: Right paratracheal adenopathy unchanged from the prior study. No other adenopathy Heart size normal.  Vascularity normal.  Atherosclerotic aortic arch Decreased lung volume with bibasilar atelectasis which has developed in the interval. No effusion. IMPRESSION: Right paratracheal adenopathy  is unchanged. CT chest with contrast recommended for further evaluation. Hypoventilation with bibasilar atelectasis. Electronically Signed   By: Franchot Gallo M.D.   On: 08/13/2020 10:50   CT SOFT TISSUE NECK W CONTRAST  Result Date: 08/14/2020 CLINICAL DATA:  History of bilateral breast cancer. Right sided neck pain and neck mass. History of CLL. EXAM: CT NECK WITH CONTRAST TECHNIQUE: Multidetector CT imaging of the neck was performed using the standard protocol following the bolus administration of intravenous contrast. CONTRAST:  84mL OMNIPAQUE IOHEXOL 300 MG/ML  SOLN COMPARISON:  Chest CT done yesterday. FINDINGS: Pharynx and larynx: No evidence of mucosal or submucosal mass. Salivary glands: Parotid and submandibular glands are intrinsically normal. Bilateral parotid lymph nodes as below. Thyroid: Normal Lymph nodes: Enlargement lymph nodes throughout all nodal stations on both sides of the neck. Index nodes are as follows. Right parotid node image 40 measuring 8 x 15 mm. Right level 2 node image 45 measuring 19 x 14 mm. Right level 4 node image 56 measuring 15 x 11 mm. Left level 4 node image 69 measuring  19 x 13 mm. Vascular: No abnormal vascular finding. Limited intracranial: Normal Visualized orbits: Normal Mastoids and visualized paranasal sinuses: Clear Skeleton: Ordinary cervical spondylosis and facet arthropathy. Chronic calcified disc herniation C4-5. Upper chest: See results of chest CT. Other: None IMPRESSION: Enlargement of lymph nodes throughout all nodal stations on both sides of the neck. Findings consistent with CLL. See results of index node measurements above. Electronically Signed   By: Nelson Chimes M.D.   On: 08/14/2020 20:34   CT CHEST ABDOMEN PELVIS W CONTRAST  Result Date: 08/13/2020 CLINICAL DATA:  Left flank and upper abdominal pain, beneath left breast since Friday. History of breast cancer and hysterectomy, history of CLL EXAM: CT CHEST, ABDOMEN, AND PELVIS WITH CONTRAST TECHNIQUE: Multidetector CT imaging of the chest, abdomen and pelvis was performed following the standard protocol during bolus administration of intravenous contrast. CONTRAST:  59mL OMNIPAQUE IOHEXOL 300 MG/ML  SOLN COMPARISON:  CT abdomen pelvis 01/06/2008, chest radiograph 08/13/2020 FINDINGS: CT CHEST FINDINGS Cardiovascular: Cardiac size within normal limits. Trace pericardial fluid, likely upper limits normal. Three-vessel coronary artery calcifications. Calcifications of the mitral annulus and aortic leaflets. Central pulmonary arteries are normal caliber. No large central or lobar pulmonary artery emboli on this non tailored examination. Atherosclerotic plaque within the normal caliber aorta. Normal 3 vessel branching of the aortic arch. Mild tortuosity of brachiocephalic vessels partially displaced by the mediastinal adenopathy/mass. Mediastinum/Nodes: There is extensive mediastinal and hilar adenopathy as well as a larger middle mediastinal soft tissue density/paratracheal mass measuring up to 4.8 by 5.1 by 5.1 cm in maximum AP by transverse by craniocaudal dimensions. Several larger nodes for reference  include: Subpectoral left axillary node measuring up to 1.8 cm short axis (2/6) Subpectoral right axillary node measuring up to 2.3 cm short axis (2/14) AP window node measuring up to 11 mm (2/21) Minimal secretions in the trachea. No significant narrowing, occlusions or other acute abnormality. Mild lateral displacement of the esophagus by the mediastinal tissue. Small sliding-type hiatal hernia. Normal thyroid. Lungs/Pleura: Some reticular bandlike opacities in the lung bases likely reflecting a combination of scarring and architectural distortion as well as subsegmental atelectatic change. Dependent atelectasis posteriorly. No concerning pulmonary nodules or masses. Mild biapical pleuroparenchymal scarring. No consolidation, features of edema, pneumothorax, or effusion. Musculoskeletal: Likely postsurgical changes from prior lumpectomy with a rounded intermediate attenuation structure measuring approximately 3.5 x 3.6 cm in the right breast (2/34) along  the chest wall with surrounding coarse calcification possibly reflecting sequela of prior fat necrosis. Fairly circumscribed region of ground-glass density within the left breast measuring approximately 2.8 x 1.8 cm in size (2/26) could reflect prior fat necrosis as well. CT ABDOMEN PELVIS FINDINGS Hepatobiliary: No worrisome focal liver abnormality is seen. Normal gallbladder. No visible calcified gallstones. No biliary ductal dilatation. Pancreas: Unremarkable. No pancreatic ductal dilatation or surrounding inflammatory changes. Spleen: Marked splenomegaly. Minimal stranding towards the splenic hilum, nonspecific. Adrenals/Urinary Tract: No concerning adrenal lesions. Kidneys enhance and excrete symmetrically. Numerous subcentimeter hypoattenuating foci in both kidneys, statistically likely benign though should be viewed with some suspicion followed on comparison imaging given the patient's history of malignancy. Mild bilateral perinephric stranding is chronic  and similar to comparison. No urolithiasis or hydronephrosis. Urinary bladder is largely decompressed at the time of exam and therefore poorly evaluated by CT imaging. No gross bladder abnormality. Stomach/Bowel: Sliding-type hiatal hernia, as above. Serpiginous hyperenhancement along the greater curvature with numerous vascular collaterals, could reflect gastric varices. Which could be better evaluated with endoscopy. Duodenum is unremarkable. No small bowel thickening or dilatation. Portion of the transverse colon protrudes into a fat and bowel containing umbilical hernia with a 2.9 by 2.9 cm fascial defect and no convincing evidence of obstruction or strangulation at this time. No resulting mechanical obstruction. Scattered colonic diverticula without focal inflammation to suggest diverticulitis. No discernible colonic dilatation or significant wall thickening. Vascular/Lymphatic: Upper abdominal venous collaterals, as above. Atherosclerotic calcifications within the abdominal aorta and branch vessels. No aneurysm or ectasia. Multiple enlarged nodes are seen in the abdomen and pelvis. These include: Aortocaval 14 mm node (2/64) Portacaval 18 mm node (2/62) Prominent inguinal nodes including a 13 mm right inguinal lymph node (2/106) Conspicuous though subcentimeter midmesenteric nodes but which are enlarged from comparison studies. Reproductive: Uterus is surgically absent. No concerning adnexal lesions. Other: Trace amount of thickening adjacent the enlarged spleen, nonspecific. No abdominopelvic free air or fluid. Fat and large bowel containing hernia without strangulation or obstruction, as above. Musculoskeletal: Scoliotic curvature of the thoracolumbar spine including a levocurvature apex T10 and a dextrocurvature apex L3. Multilevel degenerative changes are present in the imaged portions of the spine. Additional degenerative changes in the hips and pelvis. No acute or suspicious osseous lesions. IMPRESSION:  1. Extensive mediastinal, hilar, and axillary adenopathy as well as a larger middle mediastinal soft tissue conglomerate nodal mass measuring up to 4.8 cm. Findings are compatible with history of CLL. Correlate with laboratory findings. 2. Marked splenomegaly measuring up to 17 cm in length. Faint surrounding stranding is nonspecific but could be indicative of some splenic congestion in the absence of traumatic findings or history. 3. Upper abdominal venous collateralization including gastric varices. Consider direct visualization on an outpatient basis. 4. Likely postsurgical changes of the right breast. Circumscribed fat in the left breast could reflect developing fat necrosis the appearance on comparison mammography (05/09/2020) indicated a suspicious appearance of the associated calcifications and further biopsy was recommended. 5. Fat and large bowel containing umbilical hernia without convincing evidence of obstruction or strangulation at this time. 6. Numerous subcentimeter hypoattenuating foci in both kidneys, statistically likely benign though should be viewed with some suspicion followed on comparison imaging given the patient's history of malignancy. 7. Colonic diverticulosis without evidence of diverticulitis. 8. Scoliotic curvature of the thoracolumbar spine. 9. Aortic Atherosclerosis (ICD10-I70.0). These results were called by telephone at the time of interpretation on 08/13/2020 at 3:49 pm to provider [facility] , who verbally  acknowledged these results. Electronically Signed   By: Lovena Le M.D.   On: 08/13/2020 15:49   NM Myocar Multi W/Spect W/Wall Motion / EF  Result Date: 08/27/2020  There was no ST segment deviation noted during stress.  No T wave inversion was noted during stress.  The study is normal.  This is a low risk study.  The left ventricular ejection fraction is hyperdynamic (>65%).  CT attenuation images show mild coronary and aortic calcifications.      Assessment  and Plan-  ALC has decreased and LFTs wnl. Patient reports feeling like she has more energy since starting the ibrutinib. Last visit she had to use a wheelchair and this visit she was able to walk on her own.   Plan to continue ibrutinib 420mg  daily.   Oral Chemotherapy Side Effect/Intolerance:  -Constipation: Patient reports occasionally having diarrhea which she will use a suppository to resolve, but then she has issues with diarrhea. Discussed that we would like to avoid her using a suppository prn and get her on a bowel regimen that would keep her bowel normal. Suggested she start with daily Miralax. Will continue to follow  Oral Chemotherapy Adherence: no missed doses  Medication Access Issues: patient due for refill call from [place] next week. Coordinated refill for patient while in the office today. [facility] will deliver medication on 09/20/20.  No patient barriers to medication adherence identified.   Other: Dark spots on skin: Patient reports not using sunscreen when out in the sun. Recommended the patient start using a face lotion with SPF to help with skni protection.   Patient expressed understanding and was in agreement with this plan. She also understands that She can call clinic at any time with any questions, concerns, or complaints.   Thank you for allowing me to participate in the care of this very pleasant patient.   Time Total: 63mins  Visit consisted of counseling and education on dealing with issues of symptom management in the setting of serious and potentially life-threatening illness.Greater than 50%  of this time was spent counseling and coordinating care related to the above assessment and plan.  Signed by: Darl Pikes, PharmD, BCPS, Salley Slaughter, CPP Hematology/Oncology Clinical Pharmacist Practitioner [facility] [phone]  09/10/2020 9:24 AM

## 2020-09-15 ENCOUNTER — Other Ambulatory Visit: Payer: Self-pay

## 2020-09-15 ENCOUNTER — Ambulatory Visit
Admission: EM | Admit: 2020-09-15 | Discharge: 2020-09-15 | Disposition: A | Discharge status: Home / Self Care | Payer: Medicare HMO | Attending: Physician Assistant | Admitting: Physician Assistant

## 2020-09-15 DIAGNOSIS — S61511A Laceration without foreign body of right wrist, initial encounter: Secondary | ICD-10-CM

## 2020-09-15 DIAGNOSIS — W19XXXA Unspecified fall, initial encounter: Secondary | ICD-10-CM | POA: Diagnosis not present

## 2020-09-15 NOTE — ED Triage Notes (Signed)
Pt presents with c/o fall at home today. Pt has laceration to her right wrist, 2 small cuts to her right palm, bruising to her left arm and right shoulder. Pt denies head injury, LOC or other injuries. Pt does not recall her last Tetanus shot. She reports she did have a glass dish in her hand and thinks the cuts are from that busting when she fell.

## 2020-09-15 NOTE — Discharge Instructions (Signed)
Your laceration of the wrist has been repaired today with stitches.  See handout on laceration care.  You should follow-up in 10 days with our clinic or PCP office for removal of stitches.  You should follow-up sooner if there are any signs of infection including increased redness, swelling, drainage from the site that looks like pus or fevers.  You did fall, but you denies any head injury.  If you start to have any severe headaches, vision changes, dizziness, weakness, confusion call EMS.  Follow-up with our clinic or PCP if you start to have any other symptoms due to any other injuries you may notice later.

## 2020-09-16 DIAGNOSIS — S61511A Laceration without foreign body of right wrist, initial encounter: Secondary | ICD-10-CM | POA: Diagnosis not present

## 2020-09-16 NOTE — ED Provider Notes (Signed)
[facility]    CSN: [id number] Arrival date & time: 09/15/20  1042      History   Chief Complaint Chief Complaint  Patient presents with  . Fall    at home 10/2    HPI Megan Santana is a 86 y.o. female presenting for injuries following a fall at home today.  She states that she slipped and fell in her kitchen.  She says that she was carrying a glass dish and was cut on the right wrist and palm.  This injury occurred about 4 hours ago.  She denies any head injury or loss of consciousness.  She said her right shoulder is a little sore and she has some bruising on her left forearm, but she is not concerned about those injuries.  She denies any headaches, dizziness, vision changes, numbness, weakness, tingling, chest pain, breathing difficulty, abdominal pain, nausea, vomiting.  Patient has been holding pressure and keeping the right wrist bandage, but it continues to bleed.  She denies taking any anticoagulants.  Patient does have chronic lymphocytic leukemia and also previous history of breast cancer.  Patient denies any inciting symptoms before the fall.  She denies any preliminary headaches, dizziness, presyncope, chest pain, breathing difficulty.  She has no other injuries, complaints or concerns.  HPI  Past Medical History:  Diagnosis Date  . Arthritis   . Breast cancer [facility]) 2007   right lumpectomy  . Breast cancer ([place]) 03/31/2017   16 mm invasive mammary carcinoma, left upper outer quadrant, T1c, N0, triple negative, histologic grade 3.  . Family history of adverse reaction to anesthesia    mom n/v  . GERD (gastroesophageal reflux disease)   . Heart murmur   . Lumbar radiculitis   . Lumbar stenosis   . Personal history of chemotherapy 2018   Left  . Personal history of radiation therapy 2007   Right  . Personal history of radiation therapy 2018   Left    Patient Active Problem List   Diagnosis Date Noted  . CLL (chronic lymphocytic leukemia) ([place])  02/10/2020  . Goals of care, counseling/discussion 05/11/2017  . Carcinoma of upper-outer quadrant of left breast in female, estrogen receptor negative ([place]) 04/08/2017    Past Surgical History:  Procedure Laterality Date  . ABDOMINAL HYSTERECTOMY     total  . BACK SURGERY     lumbar  . BREAST BIOPSY Bilateral 03/31/2017   bilat u/s core INVASIVE MAMMARY CARCINOMA  . BREAST BIOPSY Right 03/31/2017   DENSE FIBROSIS AND SHEETS OF MACROPHAGES  . BREAST EXCISIONAL BIOPSY Left 2018  . BREAST LUMPECTOMY Right 2007  . BREAST LUMPECTOMY Left 04/23/2017  . BREAST LUMPECTOMY Left 04/23/2017   Procedure: BREAST LUMPECTOMY WITH EXCISION OF SENTINEL NODE;  Surgeon: Robert Bellow, MD;  Whole breast radiation ORS;  Service: General;  Laterality: Left;  . BREAST SURGERY Right    Wide excision  . EYE SURGERY     cataracts bil  . FRACTURE SURGERY Left 2015 or 2016  . TUMOR EXCISION  2001    OB History    Gravida  3   Para  3   Term      Preterm      AB      Living        SAB      TAB      Ectopic      Multiple      Live Births  Obstetric Comments  1st Menstrual Cycle:  18  1st Pregnancy:  21          Home Medications    Prior to Admission medications   Medication Sig Start Date End Date Taking? Authorizing Provider  acetaminophen (TYLENOL) 500 MG tablet Take 1,000 mg by mouth every 6 (six) hours as needed (for pain.).   Yes [provider]  allopurinol (ZYLOPRIM) 300 MG tablet Take 1 tablet (300 mg total) by mouth daily. 08/14/20  Yes Lloyd Huger, MD  ALPRAZolam Duanne Moron) 0.25 MG tablet Take 1 tablet (0.25 mg total) by mouth at bedtime as needed for anxiety. and sleep. 08/17/20  Yes Lloyd Huger, MD  Calcium Carb-Cholecalciferol (CALCIUM 600+D) 600-800 MG-UNIT TABS Take 1 tablet by mouth every morning.    Yes [provider]  calcium carbonate (TUMS EX) 750 MG chewable tablet Chew 1-2 tablets by mouth 3 (three) times daily  as needed (for heartburn/indigestion).   Yes [provider]  cholecalciferol (VITAMIN D3) 25 MCG (1000 UNIT) tablet Take 1,000 Units by mouth daily.   Yes [provider]  cyclobenzaprine (FLEXERIL) 10 MG tablet Take 1 tablet (10 mg total) by mouth 3 (three) times daily as needed for muscle spasms. 08/14/20  Yes Lloyd Huger, MD  HYDROcodone-acetaminophen (NORCO) 5-325 MG tablet Take 1 tablet by mouth every 6 (six) hours as needed for moderate pain. 08/24/20  Yes Lloyd Huger, MD  Ibrutinib 420 MG TABS Take 420 mg by mouth daily. 08/24/20  Yes Lloyd Huger, MD  loratadine (CLARITIN) 10 MG tablet Take 10 mg by mouth in the morning and at bedtime.    Yes [provider]  magnesium oxide (MAG-OX) 400 MG tablet Take 250 mg by mouth daily.    Yes [provider]  Multiple Vitamins-Minerals (CENTRUM SILVER 50+WOMEN) TABS Take 1 tablet by mouth daily.   Yes [provider]  Potassium 99 MG TABS Take 99 mg by mouth daily.    Yes [provider]  vitamin C (ASCORBIC ACID) 500 MG tablet Take 500 mg by mouth daily.   Yes [provider]    Family History Family History  Problem Relation Age of Onset  . Breast cancer Sister 34  . Lung cancer Mother 32       mets to breast  . Lung cancer Father 68  . Heart attack Brother   . Breast cancer Maternal Aunt        age unknown  . Stroke Sister 16    Social History Social History   Tobacco Use  . Smoking status: Former Smoker    Packs/day: 0.25    Years: 10.00    Pack years: 2.50    Types: Cigarettes    Quit date: 1970    Years since quitting: 51.7  . Smokeless tobacco: Never Used  Vaping Use  . Vaping Use: Never used  Substance Use Topics  . Alcohol use: Yes    Comment: occasionally  . Drug use: No     Allergies   Oxycodone   Review of Systems Review of Systems  Constitutional: Negative for chills, diaphoresis, fatigue and fever.  HENT: Negative for  congestion and rhinorrhea.   Respiratory: Negative for cough and shortness of breath.   Cardiovascular: Negative for chest pain and palpitations.  Gastrointestinal: Negative for abdominal pain, nausea and vomiting.  Musculoskeletal: Positive for arthralgias. Negative for back pain, joint swelling and myalgias.  Skin: Positive for color change and wound. Negative  for rash.  Neurological: Negative for dizziness, syncope, weakness and headaches.  Hematological: Negative for adenopathy. Does not bruise/bleed easily.  Psychiatric/Behavioral: Negative for confusion.     Physical Exam Triage Vital Signs ED Triage Vitals  Enc Vitals Group     BP 09/15/20 1236 (!) 158/123     Pulse Rate 09/15/20 1236 68     Resp 09/15/20 1236 18     Temp 09/15/20 1236 98.2 F (36.8 C)     Temp Source 09/15/20 1236 Oral     SpO2 09/15/20 1236 100 %     Weight 09/15/20 1233 166 lb (75.3 kg)     Height 09/15/20 1233 5\' 2"  (1.575 m)     Head Circumference --      Peak Flow --      Pain Score 09/15/20 1233 4     Pain Loc --      Pain Edu? --      Excl. in [place]? --    No data found.  Updated Vital Signs BP (!) 158/123 (BP Location: Left Arm)   Pulse 68   Temp 98.2 F (36.8 C) (Oral)   Resp 18   Ht 5\' 2"  (1.575 m)   Wt 166 lb (75.3 kg)   SpO2 100%   BMI 30.36 kg/m        Physical Exam Vitals and nursing note reviewed.  Constitutional:      General: She is not in acute distress.    Appearance: Normal appearance. She is not ill-appearing or toxic-appearing.  HENT:     Head: Normocephalic and atraumatic.     Nose: Nose normal.     Mouth/Throat:     Mouth: Mucous membranes are moist.     Pharynx: Oropharynx is clear.  Eyes:     General: No scleral icterus.       Right eye: No discharge.        Left eye: No discharge.     Conjunctiva/sclera: Conjunctivae normal.     Pupils: Pupils are equal, round, and reactive to light.  Cardiovascular:     Rate and Rhythm: Normal rate and regular rhythm.       Heart sounds: Normal heart sounds.  Pulmonary:     Effort: Pulmonary effort is normal. No respiratory distress.     Breath sounds: Normal breath sounds.  Musculoskeletal:     Cervical back: Neck supple.  Skin:    General: Skin is dry.     Findings: Bruising (mild ecchymosis dorsal left forearm, full ROM, mild TTP in area of bruising) and lesion (2.5 cm laceration to right ventral distal wrist (linear)) present.  Neurological:     General: No focal deficit present.     Mental Status: She is alert and oriented to person, place, and time. Mental status is at baseline.     Cranial Nerves: No cranial nerve deficit.     Motor: No weakness.     Gait: Gait normal.  Psychiatric:        Mood and Affect: Mood normal.        Behavior: Behavior normal.        Thought Content: Thought content normal.      UC Treatments / Results  Labs (all labs ordered are listed, but only abnormal results are displayed) Labs Reviewed - No data to display  EKG   Radiology No results found.  Procedures Laceration Repair  Date/Time: 09/16/2020 8:04 AM Performed by: Danton Clap, PA-C Authorized by: Danton Clap,  PA-C   Consent:    Consent obtained:  Verbal   Consent given by:  Patient   Risks discussed:  Infection, need for additional repair, pain, poor cosmetic result and poor wound healing   Alternatives discussed:  No treatment and delayed treatment Universal protocol:    Procedure explained and questions answered to patient or proxy's satisfaction: yes     Relevant documents present and verified: yes     Test results available and properly labeled: yes     Imaging studies available: yes     Required blood products, implants, devices, and special equipment available: yes     Site/side marked: yes     Immediately prior to procedure, a time out was called: yes     Patient identity confirmed:  Verbally with patient Anesthesia (see MAR for exact dosages):    Anesthesia method:  Local  infiltration   Local anesthetic:  Lidocaine 1% w/o epi Laceration details:    Location:  Shoulder/arm   Shoulder/arm location:  R lower arm   Length (cm):  2.5   Depth (mm):  4 Repair type:    Repair type:  Simple Pre-procedure details:    Preparation:  Patient was prepped and draped in usual sterile fashion Exploration:    Hemostasis achieved with:  Direct pressure   Wound exploration: entire depth of wound probed and visualized     Wound extent: fascia violated     Wound extent: no foreign bodies/material noted, no muscle damage noted, no nerve damage noted and no tendon damage noted     Contaminated: no   Treatment:    Area cleansed with:  Saline   Amount of cleaning:  Standard   Irrigation solution:  Sterile saline   Irrigation method:  Pressure wash Skin repair:    Repair method:  Sutures   Suture size:  5-0   Suture material:  Nylon   Suture technique:  Simple interrupted   Number of sutures:  9 Approximation:    Approximation:  Close Post-procedure details:    Dressing:  Non-adherent dressing   Patient tolerance of procedure:  Tolerated well, no immediate complications   (including critical care time)  Medications Ordered in UC Medications - No data to display  Initial Impression / Assessment and Plan / UC Course  I have reviewed the triage vital signs and the nursing notes.  Pertinent labs & imaging results that were available during my care of the patient were reviewed by me and considered in my medical decision making (see chart for details).   Wrist laceration repaired with 9 simple sutures.  Advised patient to follow-up in 10 days to have sutures removed.  Follow-up sooner for any signs of infection.  She states she was not concerned about any other injuries, I did advise her to follow-up with PCP or our department if she has any continued left arm pain or right shoulder pain.  Patient denied head injury and has a normal neurological exam.  However, I advised  her to have someone take her to the emergency department or call EMS if she suddenly develops severe headaches, dizziness, vision changes, weakness, confusion, fatigue.  Patient agreeable.   Final Clinical Impressions(s) / UC Diagnoses   Final diagnoses:  Laceration of right wrist, initial encounter  Fall, initial encounter     Discharge Instructions     Your laceration of the wrist has been repaired today with stitches.  See handout on laceration care.  You should follow-up in 10 days  with our clinic or PCP office for removal of stitches.  You should follow-up sooner if there are any signs of infection including increased redness, swelling, drainage from the site that looks like pus or fevers.  You did fall, but you denies any head injury.  If you start to have any severe headaches, vision changes, dizziness, weakness, confusion call EMS.  Follow-up with our clinic or PCP if you start to have any other symptoms due to any other injuries you may notice later.    ED Prescriptions    None     PDMP not reviewed this encounter.   Danton Clap, PA-C 09/16/20 [phone]

## 2020-09-18 ENCOUNTER — Other Ambulatory Visit: Payer: Self-pay

## 2020-09-18 ENCOUNTER — Ambulatory Visit (INDEPENDENT_AMBULATORY_CARE_PROVIDER_SITE_OTHER): Payer: Medicare HMO

## 2020-09-18 DIAGNOSIS — R9431 Abnormal electrocardiogram [ECG] [EKG]: Secondary | ICD-10-CM | POA: Diagnosis not present

## 2020-09-18 DIAGNOSIS — R0602 Shortness of breath: Secondary | ICD-10-CM | POA: Diagnosis not present

## 2020-09-18 LAB — ECHOCARDIOGRAM COMPLETE
AR max vel: 1.17 cm2
AV Area VTI: 1.2 cm2
AV Area mean vel: 1.21 cm2
AV Mean grad: 14.5 mmHg
AV Peak grad: 29.4 mmHg
Ao pk vel: 2.71 m/s
Area-P 1/2: 1.91 cm2
S' Lateral: 2.5 cm

## 2020-09-19 MED FILL — IMBRUVICA 420 MG TAB: 420 | 28 days supply | Qty: 28 | Fill #1

## 2020-09-20 ENCOUNTER — Other Ambulatory Visit: Payer: Self-pay | Admitting: General Surgery

## 2020-09-20 DIAGNOSIS — R921 Mammographic calcification found on diagnostic imaging of breast: Secondary | ICD-10-CM

## 2020-10-08 ENCOUNTER — Other Ambulatory Visit: Payer: Self-pay

## 2020-10-08 ENCOUNTER — Inpatient Hospital Stay: Payer: Medicare HMO | Attending: Oncology

## 2020-10-08 DIAGNOSIS — Z9221 Personal history of antineoplastic chemotherapy: Secondary | ICD-10-CM | POA: Diagnosis not present

## 2020-10-08 DIAGNOSIS — Z79899 Other long term (current) drug therapy: Secondary | ICD-10-CM | POA: Insufficient documentation

## 2020-10-08 DIAGNOSIS — Z923 Personal history of irradiation: Secondary | ICD-10-CM | POA: Diagnosis not present

## 2020-10-08 DIAGNOSIS — Z87891 Personal history of nicotine dependence: Secondary | ICD-10-CM | POA: Diagnosis not present

## 2020-10-08 DIAGNOSIS — Z853 Personal history of malignant neoplasm of breast: Secondary | ICD-10-CM | POA: Insufficient documentation

## 2020-10-08 DIAGNOSIS — C9110 Chronic lymphocytic leukemia of B-cell type not having achieved remission: Secondary | ICD-10-CM | POA: Diagnosis present

## 2020-10-08 LAB — CBC WITH DIFFERENTIAL/PLATELET
Abs Immature Granulocytes: 0.14 10*3/uL — ABNORMAL HIGH (ref 0.00–0.07)
Basophils Absolute: 0.1 10*3/uL (ref 0.0–0.1)
Basophils Relative: 0 %
Eosinophils Absolute: 0.3 10*3/uL (ref 0.0–0.5)
Eosinophils Relative: 1 %
HCT: 31.2 % — ABNORMAL LOW (ref 36.0–46.0)
Hemoglobin: 10.3 g/dL — ABNORMAL LOW (ref 12.0–15.0)
Immature Granulocytes: 1 %
Lymphocytes Relative: 85 %
Lymphs Abs: 26.2 10*3/uL — ABNORMAL HIGH (ref 0.7–4.0)
MCH: 30.7 pg (ref 26.0–34.0)
MCHC: 33 g/dL (ref 30.0–36.0)
MCV: 93.1 fL (ref 80.0–100.0)
Monocytes Absolute: 0.4 10*3/uL (ref 0.1–1.0)
Monocytes Relative: 1 %
Neutro Abs: 3.8 10*3/uL (ref 1.7–7.7)
Neutrophils Relative %: 12 %
Platelets: 166 10*3/uL (ref 150–400)
RBC: 3.35 MIL/uL — ABNORMAL LOW (ref 3.87–5.11)
RDW: 14.5 % (ref 11.5–15.5)
Smear Review: NORMAL
WBC: 30.8 10*3/uL — ABNORMAL HIGH (ref 4.0–10.5)
nRBC: 0 % (ref 0.0–0.2)

## 2020-10-08 LAB — COMPREHENSIVE METABOLIC PANEL
ALT: 29 U/L (ref 0–44)
AST: 23 U/L (ref 15–41)
Albumin: 4 g/dL (ref 3.5–5.0)
Alkaline Phosphatase: 44 U/L (ref 38–126)
Anion gap: 8 (ref 5–15)
BUN: 18 mg/dL (ref 8–23)
CO2: 24 mmol/L (ref 22–32)
Calcium: 9.5 mg/dL (ref 8.9–10.3)
Chloride: 106 mmol/L (ref 98–111)
Creatinine, Ser: 0.98 mg/dL (ref 0.44–1.00)
GFR, Estimated: 57 mL/min — ABNORMAL LOW (ref >60)
Glucose, Bld: 92 mg/dL (ref 70–99)
Potassium: 4.2 mmol/L (ref 3.5–5.1)
Sodium: 138 mmol/L (ref 135–145)
Total Bilirubin: 0.6 mg/dL (ref 0.3–1.2)
Total Protein: 6.6 g/dL (ref 6.5–8.1)

## 2020-10-08 LAB — PHOSPHORUS: Phosphorus: 4.8 mg/dL — ABNORMAL HIGH (ref 2.5–4.6)

## 2020-10-08 LAB — MAGNESIUM: Magnesium: 1.9 mg/dL (ref 1.7–2.4)

## 2020-10-11 ENCOUNTER — Other Ambulatory Visit: Payer: Self-pay | Admitting: Oncology

## 2020-10-19 MED FILL — IMBRUVICA 420 MG TAB: 420 | 28 days supply | Qty: 28 | Fill #2

## 2020-11-03 NOTE — Progress Notes (Signed)
[facility]  [phone] [phone]  ID: Megan Santana OB: [birth year]  MR#: [id number]  MYT#:[id number]  Patient Care Team: Lynnell Jude, MD as PCP - General (Family Medicine) Rico Junker, RN as Oncology Nurse Navigator Grayland Ormond, Kathlene November, MD as Consulting Physician (Oncology) Clemmie Krill Lynnell Jude, MD as Referring Physician (Family Medicine) Bary Castilla, Forest Gleason, MD (General Surgery) Noreene Filbert, MD as Referring Physician (Radiation Oncology)   CHIEF COMPLAINT:  CLL, q13 deletion  INTERVAL HISTORY: Patient returns to clinic today for further evaluation and continuation of Imbruvica.  She is tolerating treatments well without significant side effects.  She does not complain of any weakness or fatigue.  Her anxiety and insomnia have essentially resolved.  She does not complain of flank pain today.  She has no neurologic complaints. She denies any recent fevers or illnesses.  She denies any night sweats or unintentional weight loss.  She has no chest pain, shortness of breath, cough, or hemoptysis.  She denies any nausea, vomiting, constipation, or diarrhea. She has no urinary complaints.  Patient offers no specific complaints today.  REVIEW OF SYSTEMS:   Review of Systems  Constitutional: Negative.  Negative for fever, malaise/fatigue and weight loss.  Respiratory: Negative.  Negative for cough, hemoptysis and shortness of breath.   Cardiovascular: Negative.  Negative for chest pain and leg swelling.  Gastrointestinal: Negative.  Negative for abdominal pain and constipation.  Genitourinary: Negative.  Negative for dysuria and flank pain.  Musculoskeletal: Negative for back pain and joint pain.  Skin: Negative.  Negative for rash.  Neurological: Negative.  Negative for sensory change, focal weakness, weakness and headaches.  Psychiatric/Behavioral: Negative.  The patient is not nervous/anxious and does not have insomnia.     As per HPI.  Otherwise, a complete review of systems is negative.  PAST MEDICAL HISTORY: Past Medical History:  Diagnosis Date  . Arthritis   . Breast cancer [facility]) 2007   right lumpectomy  . Breast cancer ([place]) 03/31/2017   16 mm invasive mammary carcinoma, left upper outer quadrant, T1c, N0, triple negative, histologic grade 3.  . Family history of adverse reaction to anesthesia    mom n/v  . GERD (gastroesophageal reflux disease)   . Heart murmur   . Lumbar radiculitis   . Lumbar stenosis   . Personal history of chemotherapy 2018   Left  . Personal history of radiation therapy 2007   Right  . Personal history of radiation therapy 2018   Left    PAST SURGICAL HISTORY: Past Surgical History:  Procedure Laterality Date  . ABDOMINAL HYSTERECTOMY     total  . BACK SURGERY     lumbar  . BREAST BIOPSY Bilateral 03/31/2017   bilat u/s core INVASIVE MAMMARY CARCINOMA  . BREAST BIOPSY Right 03/31/2017   DENSE FIBROSIS AND SHEETS OF MACROPHAGES  . BREAST EXCISIONAL BIOPSY Left 2018  . BREAST LUMPECTOMY Right 2007  . BREAST LUMPECTOMY Left 04/23/2017  . BREAST LUMPECTOMY Left 04/23/2017   Procedure: BREAST LUMPECTOMY WITH EXCISION OF SENTINEL NODE;  Surgeon: Robert Bellow, MD;  Whole breast radiation ORS;  Service: General;  Laterality: Left;  . BREAST SURGERY Right    Wide excision  . EYE SURGERY     cataracts bil  . FRACTURE SURGERY Left 2015 or 2016  . TUMOR EXCISION  2001    FAMILY HISTORY: Family History  Problem Relation Age of Onset  . Breast cancer Sister 48  . Lung cancer Mother 74  mets to breast  . Lung cancer Father 55  . Heart attack Brother   . Breast cancer Maternal Aunt        age unknown  . Stroke Sister 19    ADVANCED DIRECTIVES (Y/N):  N  HEALTH MAINTENANCE: Social History   Tobacco Use  . Smoking status: Former Smoker    Packs/day: 0.25    Years: 10.00    Pack years: 2.50    Types: Cigarettes    Quit date: 1970    Years since quitting:  51.9  . Smokeless tobacco: Never Used  Vaping Use  . Vaping Use: Never used  Substance Use Topics  . Alcohol use: Yes    Comment: occasionally  . Drug use: No     Colonoscopy:  PAP:  Bone density:  Lipid panel:  Allergies  Allergen Reactions  . Oxycodone Other (See Comments)    Can't sleep, feels bad when taking.    Current Outpatient Medications  Medication Sig Dispense Refill  . acetaminophen (TYLENOL) 500 MG tablet Take 1,000 mg by mouth every 6 (six) hours as needed (for pain.).    Marland Kitchen allopurinol (ZYLOPRIM) 300 MG tablet Take 1 tablet (300 mg total) by mouth daily. 60 tablet 1  . Calcium Carb-Cholecalciferol (CALCIUM 600+D) 600-800 MG-UNIT TABS Take 1 tablet by mouth every morning.     . calcium carbonate (TUMS EX) 750 MG chewable tablet Chew 1-2 tablets by mouth 3 (three) times daily as needed (for heartburn/indigestion).    . cholecalciferol (VITAMIN D3) 25 MCG (1000 UNIT) tablet Take 1,000 Units by mouth daily.    . cyclobenzaprine (FLEXERIL) 10 MG tablet Take 1 tablet (10 mg total) by mouth 3 (three) times daily as needed for muscle spasms. 60 tablet 0  . magnesium oxide (MAG-OX) 400 MG tablet Take 250 mg by mouth daily.     . Multiple Vitamins-Minerals (CENTRUM SILVER 50+WOMEN) TABS Take 1 tablet by mouth daily.    . Potassium 99 MG TABS Take 99 mg by mouth daily.     . vitamin C (ASCORBIC ACID) 500 MG tablet Take 500 mg by mouth daily.    Marland Kitchen ALPRAZolam (XANAX) 0.25 MG tablet Take 1 tablet (0.25 mg total) by mouth at bedtime as needed for anxiety. and sleep. (Patient not taking: Reported on 11/05/2020) 30 tablet 0  . HYDROcodone-acetaminophen (NORCO) 5-325 MG tablet Take 1 tablet by mouth every 6 (six) hours as needed for moderate pain. (Patient not taking: Reported on 11/05/2020) 30 tablet 0  . Ibrutinib 420 MG TABS Take 420 mg by mouth daily. 28 tablet 5  . loratadine (CLARITIN) 10 MG tablet Take 10 mg by mouth in the morning and at bedtime.  (Patient not taking: Reported  on 11/05/2020)     No current facility-administered medications for this visit.    OBJECTIVE: Vitals:   11/05/20 1445  BP: (!) 145/62  Pulse: 61  Temp: 98 F (36.7 C)  SpO2: 100%     Body mass index is 30.73 kg/m.    ECOG FS:0 - Asymptomatic  General: Well-developed, well-nourished, no acute distress. Eyes: Pink conjunctiva, anicteric sclera. HEENT: Normocephalic, moist mucous membranes. Lungs: No audible wheezing or coughing. Heart: Regular rate and rhythm. Abdomen: Soft, nontender, no obvious distention. Musculoskeletal: No edema, cyanosis, or clubbing. Neuro: Alert, answering all questions appropriately. Cranial nerves grossly intact. Skin: No rashes or petechiae noted. Psych: Normal affect.   LAB RESULTS:  Lab Results  Component Value Date   NA 138 11/05/2020   K  4.2 11/05/2020   CL 106 11/05/2020   CO2 20 (L) 11/05/2020   GLUCOSE 91 11/05/2020   BUN 14 11/05/2020   CREATININE 1.01 (H) 11/05/2020   CALCIUM 9.1 11/05/2020   PROT 6.6 11/05/2020   ALBUMIN 4.0 11/05/2020   AST 20 11/05/2020   ALT 16 11/05/2020   ALKPHOS 40 11/05/2020   BILITOT 0.5 11/05/2020   GFRNONAA 55 (L) 11/05/2020   GFRAA >60 09/10/2020    Lab Results  Component Value Date   WBC 31.3 (H) 11/05/2020   NEUTROABS 5.6 11/05/2020   HGB 10.8 (L) 11/05/2020   HCT 33.0 (L) 11/05/2020   MCV 91.4 11/05/2020   PLT 152 11/05/2020     STUDIES: No results found.  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 CLL, q13 deletion.  CT scan results reviewed independently and reported as above with significant, widespread lymphadenopathy as well as massive splenomegaly causing flank pain.  Patient had a reaction to Rituxan therefore this has been discontinued.  Patient was initiated on 420 mg Imbruvica in October 2021.  Her white blood cell count has improved and is now 31.3.  Patient has been instructed to discontinue allopurinol when she completes this current prescription.  No further  intervention is needed.  Return to clinic in 6 weeks for laboratory work only and then in 3 months for laboratory work, repeat imaging, and further evaluation.  Appreciate clinical pharmacy input.   2. 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 This was likely a second primary.  Patient completed 4 cycles of Taxotere and Cytoxan August 06, 2017.  Also completed adjuvant XRT.  An aromatase inhibitor would not be of any benefit given the ER/PR status of her tumor.  Patient's most recent mammogram on May 09, 2020 was reported as BI-RADS 4.  Patient and her surgeon agreed to wait 6 months with for repeat mammogram which is scheduled in the next several weeks.  3. 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 Oncotype score 17: Originally diagnosed in 2008. Patient completed 5 years of Arimidex in approximately August 2013. Biopsy of the right breast only revealed fat necrosis at the site of her previous MammoSite.  Mammogram and possible biopsy as above. 4. Genetic testing: Patient has a sister and mother both with breast cancer. 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 5.  Back pain: Patient does not complain of this today.  Patient reports she is no longer taking Flexeril. 6.  Flank pain: Patient does not complain of this today.  Secondary to splenomegaly.  Imbruvica as above.  Continue Vicodin sparingly.   7.  Anxiety: Improved, continue Xanax as needed.   Patient expressed understanding and was in agreement with this plan. She also understands that She can call clinic at any time with any questions, concerns, or complaints.   Cancer Staging Carcinoma of upper-outer quadrant of left breast in female, estrogen receptor negative ([place]) Staging form: Breast,  AJCC 8th Edition - Clinical stage from 04/08/2017: Stage IB (cT1c, cN0, cM0, G3, ER: Negative, PR: Negative, HER2: Negative) - Signed by Lloyd Huger, MD on 04/08/2017 - Pathologic stage from 05/11/2017: Stage IB (pT1c, pN0, cM0, G3, ER: Negative, PR: Negative, HER2: Negative) - Signed by Lloyd Huger, MD on 05/11/2017  Lloyd Huger, MD   11/06/2020 11:52 AM

## 2020-11-05 ENCOUNTER — Inpatient Hospital Stay: Payer: Medicare HMO | Admitting: Pharmacist

## 2020-11-05 ENCOUNTER — Other Ambulatory Visit: Payer: Self-pay | Admitting: Pharmacist

## 2020-11-05 ENCOUNTER — Encounter: Payer: Self-pay | Admitting: Oncology

## 2020-11-05 ENCOUNTER — Inpatient Hospital Stay (HOSPITAL_BASED_OUTPATIENT_CLINIC_OR_DEPARTMENT_OTHER): Payer: Medicare HMO | Admitting: Oncology

## 2020-11-05 ENCOUNTER — Inpatient Hospital Stay: Payer: Medicare HMO | Attending: Oncology

## 2020-11-05 VITALS — BP 145/62 | HR 61 | Temp 98.0°F | Wt 168.0 lb

## 2020-11-05 DIAGNOSIS — C9110 Chronic lymphocytic leukemia of B-cell type not having achieved remission: Secondary | ICD-10-CM | POA: Insufficient documentation

## 2020-11-05 DIAGNOSIS — Z9221 Personal history of antineoplastic chemotherapy: Secondary | ICD-10-CM | POA: Insufficient documentation

## 2020-11-05 DIAGNOSIS — Z803 Family history of malignant neoplasm of breast: Secondary | ICD-10-CM | POA: Diagnosis not present

## 2020-11-05 DIAGNOSIS — Z79899 Other long term (current) drug therapy: Secondary | ICD-10-CM | POA: Insufficient documentation

## 2020-11-05 DIAGNOSIS — K219 Gastro-esophageal reflux disease without esophagitis: Secondary | ICD-10-CM | POA: Diagnosis not present

## 2020-11-05 DIAGNOSIS — F419 Anxiety disorder, unspecified: Secondary | ICD-10-CM | POA: Insufficient documentation

## 2020-11-05 DIAGNOSIS — Z90710 Acquired absence of both cervix and uterus: Secondary | ICD-10-CM | POA: Diagnosis not present

## 2020-11-05 DIAGNOSIS — Z87891 Personal history of nicotine dependence: Secondary | ICD-10-CM | POA: Insufficient documentation

## 2020-11-05 DIAGNOSIS — Z8249 Family history of ischemic heart disease and other diseases of the circulatory system: Secondary | ICD-10-CM | POA: Insufficient documentation

## 2020-11-05 DIAGNOSIS — Z801 Family history of malignant neoplasm of trachea, bronchus and lung: Secondary | ICD-10-CM | POA: Insufficient documentation

## 2020-11-05 DIAGNOSIS — Z853 Personal history of malignant neoplasm of breast: Secondary | ICD-10-CM | POA: Diagnosis not present

## 2020-11-05 DIAGNOSIS — Z923 Personal history of irradiation: Secondary | ICD-10-CM | POA: Diagnosis not present

## 2020-11-05 LAB — CBC WITH DIFFERENTIAL/PLATELET
Abs Immature Granulocytes: 0.14 10*3/uL — ABNORMAL HIGH (ref 0.00–0.07)
Basophils Absolute: 0.2 10*3/uL — ABNORMAL HIGH (ref 0.0–0.1)
Basophils Relative: 1 %
Eosinophils Absolute: 0.3 10*3/uL (ref 0.0–0.5)
Eosinophils Relative: 1 %
HCT: 33 % — ABNORMAL LOW (ref 36.0–46.0)
Hemoglobin: 10.8 g/dL — ABNORMAL LOW (ref 12.0–15.0)
Immature Granulocytes: 0 %
Lymphocytes Relative: 78 %
Lymphs Abs: 24.5 10*3/uL — ABNORMAL HIGH (ref 0.7–4.0)
MCH: 29.9 pg (ref 26.0–34.0)
MCHC: 32.7 g/dL (ref 30.0–36.0)
MCV: 91.4 fL (ref 80.0–100.0)
Monocytes Absolute: 0.6 10*3/uL (ref 0.1–1.0)
Monocytes Relative: 2 %
Neutro Abs: 5.6 10*3/uL (ref 1.7–7.7)
Neutrophils Relative %: 18 %
Platelets: 152 10*3/uL (ref 150–400)
RBC: 3.61 MIL/uL — ABNORMAL LOW (ref 3.87–5.11)
RDW: 14.1 % (ref 11.5–15.5)
WBC: 31.3 10*3/uL — ABNORMAL HIGH (ref 4.0–10.5)
nRBC: 0 % (ref 0.0–0.2)

## 2020-11-05 LAB — COMPREHENSIVE METABOLIC PANEL
ALT: 16 U/L (ref 0–44)
AST: 20 U/L (ref 15–41)
Albumin: 4 g/dL (ref 3.5–5.0)
Alkaline Phosphatase: 40 U/L (ref 38–126)
Anion gap: 12 (ref 5–15)
BUN: 14 mg/dL (ref 8–23)
CO2: 20 mmol/L — ABNORMAL LOW (ref 22–32)
Calcium: 9.1 mg/dL (ref 8.9–10.3)
Chloride: 106 mmol/L (ref 98–111)
Creatinine, Ser: 1.01 mg/dL — ABNORMAL HIGH (ref 0.44–1.00)
GFR, Estimated: 55 mL/min — ABNORMAL LOW (ref >60)
Glucose, Bld: 91 mg/dL (ref 70–99)
Potassium: 4.2 mmol/L (ref 3.5–5.1)
Sodium: 138 mmol/L (ref 135–145)
Total Bilirubin: 0.5 mg/dL (ref 0.3–1.2)
Total Protein: 6.6 g/dL (ref 6.5–8.1)

## 2020-11-05 LAB — PHOSPHORUS: Phosphorus: 3.9 mg/dL (ref 2.5–4.6)

## 2020-11-05 LAB — MAGNESIUM: Magnesium: 2 mg/dL (ref 1.7–2.4)

## 2020-11-05 MED ORDER — IBRUTINIB 420 MG PO TABS: 420.0000 mg | ORAL_TABLET | Freq: Every day | ORAL | 5 refills | Status: DC

## 2020-11-05 NOTE — Progress Notes (Signed)
[place]  Telephone:([phone] [phone]  Patient Care Team: Lynnell Jude, MD as PCP - General (Family Medicine) Rico Junker, RN as Oncology Nurse Navigator Grayland Ormond, Kathlene November, MD as Consulting Physician (Oncology) Clemmie Krill Lynnell Jude, MD as Referring Physician (Family Medicine) Bary Castilla Forest Gleason, MD (General Surgery) Noreene Filbert, MD as Referring Physician (Radiation Oncology)   Name of the patient: Megan Santana  [id number]  1934-06-21   Date of visit: 11/05/20  HPI: Patient is a 86 y.o. female with CLL, 13q deletion. She was originally treated with rituximab, but had a reaction to her first infusion. She was then switched to treatment with Imbruvica (ibrutinib). Ibrutinib started on 08/28/20. Patient's daughter is present at the visit today.  Reason for Consult: Oral chemotherapy follow-up for ibrutinib therapy.   PAST MEDICAL HISTORY: Past Medical History:  Diagnosis Date  . Arthritis   . Breast cancer [facility]) 2007   right lumpectomy  . Breast cancer ([place]) 03/31/2017   16 mm invasive mammary carcinoma, left upper outer quadrant, T1c, N0, triple negative, histologic grade 3.  . Family history of adverse reaction to anesthesia    mom n/v  . GERD (gastroesophageal reflux disease)   . Heart murmur   . Lumbar radiculitis   . Lumbar stenosis   . Personal history of chemotherapy 2018   Left  . Personal history of radiation therapy 2007   Right  . Personal history of radiation therapy 2018   Left    PAST SURGICAL HISTORY:  Past Surgical History:  Procedure Laterality Date  . ABDOMINAL HYSTERECTOMY     total  . BACK SURGERY     lumbar  . BREAST BIOPSY Bilateral 03/31/2017   bilat u/s core INVASIVE MAMMARY CARCINOMA  . BREAST BIOPSY Right 03/31/2017   DENSE FIBROSIS AND SHEETS OF MACROPHAGES  . BREAST EXCISIONAL BIOPSY Left 2018  . BREAST LUMPECTOMY Right 2007  . BREAST LUMPECTOMY Left 04/23/2017   . BREAST LUMPECTOMY Left 04/23/2017   Procedure: BREAST LUMPECTOMY WITH EXCISION OF SENTINEL NODE;  Surgeon: Robert Bellow, MD;  Whole breast radiation ORS;  Service: General;  Laterality: Left;  . BREAST SURGERY Right    Wide excision  . EYE SURGERY     cataracts bil  . FRACTURE SURGERY Left 2015 or 2016  . TUMOR EXCISION  2001    HEMATOLOGY/ONCOLOGY HISTORY:  Oncology History  CLL (chronic lymphocytic leukemia) ([place])  02/10/2020 Initial Diagnosis   CLL (chronic lymphocytic leukemia) ([place])   08/17/2020 - 08/17/2020 Chemotherapy   The patient had riTUXimab-pvvr (RUXIENCE) 700 mg in sodium chloride 0.9 % 250 mL (2.1875 mg/mL) infusion, 375 mg/m2 = 700 mg, Intravenous,  Once, 1 of 4 cycles Administration: 700 mg (08/17/2020)  for chemotherapy treatment.      ALLERGIES:  is allergic to oxycodone.  MEDICATIONS:  Current Outpatient Medications  Medication Sig Dispense Refill  . acetaminophen (TYLENOL) 500 MG tablet Take 1,000 mg by mouth every 6 (six) hours as needed (for pain.).    Marland Kitchen allopurinol (ZYLOPRIM) 300 MG tablet Take 1 tablet (300 mg total) by mouth daily. 60 tablet 1  . ALPRAZolam (XANAX) 0.25 MG tablet Take 1 tablet (0.25 mg total) by mouth at bedtime as needed for anxiety. and sleep. (Patient not taking: Reported on 11/05/2020) 30 tablet 0  . Calcium Carb-Cholecalciferol (CALCIUM 600+D) 600-800 MG-UNIT TABS Take 1 tablet by mouth every morning.     . calcium carbonate (TUMS EX) 750 MG chewable tablet Chew  1-2 tablets by mouth 3 (three) times daily as needed (for heartburn/indigestion).    . cholecalciferol (VITAMIN D3) 25 MCG (1000 UNIT) tablet Take 1,000 Units by mouth daily.    . cyclobenzaprine (FLEXERIL) 10 MG tablet Take 1 tablet (10 mg total) by mouth 3 (three) times daily as needed for muscle spasms. 60 tablet 0  . HYDROcodone-acetaminophen (NORCO) 5-325 MG tablet Take 1 tablet by mouth every 6 (six) hours as needed for moderate pain. (Patient not taking: Reported on  11/05/2020) 30 tablet 0  . Ibrutinib 420 MG TABS Take 420 mg by mouth daily. 28 tablet 5  . loratadine (CLARITIN) 10 MG tablet Take 10 mg by mouth in the morning and at bedtime.  (Patient not taking: Reported on 11/05/2020)    . magnesium oxide (MAG-OX) 400 MG tablet Take 250 mg by mouth daily.     . Multiple Vitamins-Minerals (CENTRUM SILVER 50+WOMEN) TABS Take 1 tablet by mouth daily.    . Potassium 99 MG TABS Take 99 mg by mouth daily.     . vitamin C (ASCORBIC ACID) 500 MG tablet Take 500 mg by mouth daily.     No current facility-administered medications for this visit.    VITAL SIGNS: There were no vitals taken for this visit. There were no vitals filed for this visit.  Estimated body mass index is 30.73 kg/m as calculated from the following:   Height as of 09/15/20: 5\' 2"  (1.575 m).   Weight as of an earlier encounter on 11/05/20: 76.2 kg (168 lb).  LABS: CBC:    Component Value Date/Time   WBC 31.3 (H) 11/05/2020 1419   HGB 10.8 (L) 11/05/2020 1419   HCT 33.0 (L) 11/05/2020 1419   PLT 152 11/05/2020 1419   MCV 91.4 11/05/2020 1419   NEUTROABS 5.6 11/05/2020 1419   LYMPHSABS 24.5 (H) 11/05/2020 1419   MONOABS 0.6 11/05/2020 1419   EOSABS 0.3 11/05/2020 1419   BASOSABS 0.2 (H) 11/05/2020 1419   Comprehensive Metabolic Panel:    Component Value Date/Time   NA 138 11/05/2020 1419   K 4.2 11/05/2020 1419   CL 106 11/05/2020 1419   CO2 20 (L) 11/05/2020 1419   BUN 14 11/05/2020 1419   CREATININE 1.01 (H) 11/05/2020 1419   GLUCOSE 91 11/05/2020 1419   CALCIUM 9.1 11/05/2020 1419   AST 20 11/05/2020 1419   ALT 16 11/05/2020 1419   ALKPHOS 40 11/05/2020 1419   BILITOT 0.5 11/05/2020 1419   PROT 6.6 11/05/2020 1419   ALBUMIN 4.0 11/05/2020 1419    RADIOGRAPHIC STUDIES: No results found.   Assessment and Plan-  ALC has decreased slightly and LFTs wnl. Plan to continue ibrutinib 420mg  daily.   Oral Chemotherapy Side Effect/Intolerance:  -Bowel movements:  patient previously had issues with constipation but at today's visit reports she has been having frequent diarrhea. Has has not taken anything for the diarrhea. She is hesitant to take anything because she has hemorrhoids and doesn't not want to have constipation again.  Discussed taking half a loperamide tablets to try and stop the diarrhea without causing diarrhea. Encouraged the patient to keep hydrated.   Oral Chemotherapy Adherence: no missed doses  Medication Access Issues: patient due for refill call from [place] next week. Coordinated refill for patient while in the office today. [facility] will deliver medication on 11/14/20.  No patient barriers to medication adherence identified.   Patient expressed understanding and was in agreement with this plan. She also understands  that She can call clinic at any time with any questions, concerns, or complaints.   Thank you for allowing me to participate in the care of this very pleasant patient.   Time Total: 37mins  Visit consisted of counseling and education on dealing with issues of symptom management in the setting of serious and potentially life-threatening illness.Greater than 50%  of this time was spent counseling and coordinating care related to the above assessment and plan.  Signed by: Darl Pikes, PharmD, BCPS, Salley Slaughter, CPP Hematology/Oncology Clinical Pharmacist Practitioner [facility]/HP/AP [facility] [phone]  11/05/2020 4:07 PM

## 2020-11-12 ENCOUNTER — Other Ambulatory Visit: Payer: Self-pay | Admitting: General Surgery

## 2020-11-12 ENCOUNTER — Ambulatory Visit
Admission: RE | Admit: 2020-11-12 | Discharge: 2020-11-12 | Disposition: A | Discharge status: Home / Self Care | Payer: Medicare HMO | Source: Ambulatory Visit | Attending: General Surgery | Admitting: General Surgery

## 2020-11-12 ENCOUNTER — Other Ambulatory Visit: Payer: Self-pay

## 2020-11-12 DIAGNOSIS — R921 Mammographic calcification found on diagnostic imaging of breast: Secondary | ICD-10-CM | POA: Diagnosis present

## 2020-11-12 DIAGNOSIS — R920 Mammographic microcalcification found on diagnostic imaging of breast: Secondary | ICD-10-CM

## 2020-11-14 MED FILL — IMBRUVICA 420 MG TAB: 420 | 28 days supply | Qty: 28 | Fill #0

## 2020-11-20 ENCOUNTER — Ambulatory Visit
Admission: RE | Admit: 2020-11-20 | Discharge: 2020-11-20 | Disposition: A | Discharge status: Home / Self Care | Payer: Medicare HMO | Source: Ambulatory Visit | Attending: General Surgery | Admitting: General Surgery

## 2020-11-20 ENCOUNTER — Other Ambulatory Visit: Payer: Self-pay

## 2020-11-20 DIAGNOSIS — R920 Mammographic microcalcification found on diagnostic imaging of breast: Secondary | ICD-10-CM | POA: Insufficient documentation

## 2020-11-21 LAB — SURGICAL PATHOLOGY

## 2020-11-22 ENCOUNTER — Encounter: Payer: Self-pay | Admitting: *Deleted

## 2020-11-22 NOTE — Progress Notes (Signed)
Notified by Electa Sniff, RN from [facility] of patients new diagnosis of DCIS.  This is the patient's 3 breast cancer.  She was diagnosed with right breast cancer in 2008, left breast cancer in 2018 and current DCIS of the left breast.  She is also seen by Dr. Grayland Ormond for CLL.  Talked to patient today.  She is scheduled to see Dr. Bary Castilla for surgical decision today.  States she will most likely have a mastectomy.  Offered navigation services to her again.  She was encouraged to call with any questions or needs.  Notified Dr. Grayland Ormond to see if he preferred to see her prior or post surgery.

## 2020-12-10 ENCOUNTER — Other Ambulatory Visit: Payer: Medicare HMO

## 2020-12-12 MED FILL — IMBRUVICA 420 MG TAB: 420 | 28 days supply | Qty: 28 | Fill #1

## 2020-12-20 ENCOUNTER — Other Ambulatory Visit: Payer: Medicare HMO

## 2020-12-20 ENCOUNTER — Other Ambulatory Visit: Payer: Self-pay | Admitting: General Surgery

## 2020-12-20 DIAGNOSIS — D0512 Intraductal carcinoma in situ of left breast: Secondary | ICD-10-CM

## 2020-12-20 NOTE — Progress Notes (Signed)
Subjective:     Patient ID: Megan Santana is a 87 y.o. female.  HPI  The following portions of the patient's history were reviewed and updated as appropriate.  This an established patient is here today for: office visit. She is here to discuss having breast surgery. She is complaining of "gas" with pain in her right shoulder. She is here with her son, Richardson Landry.  Review of Systems  Constitutional: Negative for chills and fever.  Respiratory: Negative for cough.        Chief Complaint  Patient presents with  . Follow-up     There were no vitals taken for this visit.  Past Medical History:  Diagnosis Date  . Arthritis   . Breast cancer (CMS-HCC) 2007   right  . Breast cancer (CMS-HCC) 03/31/2017   Left upper outer quadrant, T1c, N0, whole breast.  Triple negative  . Breast cancer (CMS-HCC) 11/20/2020   Left lower outer quadrant, DCIS, high-grade  . Cardiac murmur   . Chronic lymphocytic leukemia (CMS-HCC) 09/2019  . Family history of adverse reaction to anesthesia   . GERD (gastroesophageal reflux disease)   . H/O: hysterectomy 05/1980  . History of chemotherapy 2018  . History of radiation therapy 2007  . History of radiation therapy 2008  . Hyperlipidemia   . Lumbar radiculitis   . Skin cancer of face 06/2011  . Spinal stenosis of lumbar region           Past Surgical History:  Procedure Laterality Date  . ABDOMINAL HYSTERECTOMY  05/1980  . Back Surgery  06/1997  . CATARACT EXTRACTION Bilateral   . COLONOSCOPY  02/26/2010  . MASTECTOMY PARTIAL / LUMPECTOMY Right 2007  . MASTECTOMY PARTIAL / LUMPECTOMY Left 04/2017  . Open Reduction and internal fixation, left distal radius Left 10/27/2013  . Tumors Removed  01/2000   non cancer tumor              OB History    Gravida  3   Para  3   Term      Preterm      AB      Living        SAB      IAB      Ectopic      Molar      Multiple      Live Births           Obstetric Comments  Age at first period 65 Age of first pregnancy 75         Social History          Socioeconomic History  . Marital status: Married    Spouse name: Not on file  . Number of children: Not on file  . Years of education: Not on file  . Highest education level: Not on file  Occupational History  . Not on file  Tobacco Use  . Smoking status: Former Smoker    Packs/day: 0.50    Years: 10.00    Pack years: 5.00  . Smokeless tobacco: Never Used  Substance and Sexual Activity  . Alcohol use: Yes    Comment: occasionally  . Drug use: No  . Sexual activity: Not on file  Other Topics Concern  . Not on file  Social History Narrative  . Not on file   Social Determinants of Health   Financial Resource Strain: Not on file  Food Insecurity: Not on file  Transportation Needs: Not on file  Allergies  Allergen Reactions  . Oxycodone Other (See Comments)    Can't sleep, feels bad when taking.    Current Medications        Current Outpatient Medications  Medication Sig Dispense Refill  . ascorbic acid (VITAMIN C) 500 MG tablet Take 500 mg by mouth once daily.    . calcium carbonate (TUMS E-X) 300 mg (750 mg) chewable tablet Take by mouth    . calcium carbonate-vitamin D3 500 mg(1,250mg ) -125 unit per tablet Take 600 mg by mouth 2 (two) times daily.    . cholecalciferol (VITAMIN D3) 1000 unit tablet Take by mouth    . cyclobenzaprine (FLEXERIL) 10 MG tablet Take 10 mg by mouth 3 (three) times daily as needed    . ibuprofen (ADVIL,MOTRIN) 200 MG tablet Take 800 mg by mouth as needed for Pain.    . IMBRUVICA 420 mg Tab TAKE 1 TABLET (420MG ) BY MOUTH DAILY    . magnesium oxide (MAG-OX) 400 mg (241.3 mg magnesium) tablet Take by mouth    . multivitamin tablet Take 1 tablet by mouth once daily.    Marland Kitchen POTASSIUM CHLORIDE ORAL Take 595 mg by mouth 2 (two) times daily.    Marland Kitchen aspirin 81 MG EC tablet Take 81 mg by mouth  once daily. (Patient not taking: Reported on 11/22/2020  )     No current facility-administered medications for this visit.           Family History  Problem Relation Age of Onset  . Lung cancer Mother   . Lung cancer Father   . Stroke Sister   . Breast cancer Sister   . Myocardial Infarction (Heart attack) Brother   . Breast cancer Maternal Aunt   . Colon cancer Neg Hx        Objective:   Physical Exam Constitutional:      Appearance: Normal appearance.  Cardiovascular:     Rate and Rhythm: Normal rate and regular rhythm.     Pulses: Normal pulses.     Heart sounds: Normal heart sounds.  Pulmonary:     Effort: Pulmonary effort is normal.     Breath sounds: Normal breath sounds.  Musculoskeletal:     Cervical back: Neck supple.  Skin:    General: Skin is warm and dry.  Neurological:     Mental Status: She is alert and oriented to person, place, and time.  Psychiatric:        Mood and Affect: Mood normal.        Behavior: Behavior normal.        Assessment:     Left breast DCIS status post prior breast conservation surgery.  Chronic right breast pain secondary to fat necrosis.    Plan:     At this time, the right breast does not warrant intervention.  We reviewed options for management of the new area of DCIS in the left breast: 1) repeat wide excision with wire localization of the secondary area of microcalcifications anterior to the previously identified DCIS with or without postoperative radiation therapy (MammoSite) versus 2) mastectomy.  After reasonable back-and-forth she stated "I really just do not want this to come back ".  In light of that, I think she will be well served by a left simple mastectomy.  I will complete a sentinel node biopsy at that time in the event that there is some upstaging to invasive cancer.       Entered by Karie Fetch, RN, acting as a  scribe for Dr. Donnalee Curry, MD.  The documentation recorded by  the scribe accurately reflects the service I personally performed and the decisions made by me.   Earline Mayotte, MD FACS

## 2020-12-24 ENCOUNTER — Inpatient Hospital Stay: Payer: Medicare HMO | Attending: Oncology

## 2020-12-24 ENCOUNTER — Ambulatory Visit: Payer: Medicare HMO | Admitting: Radiation Oncology

## 2020-12-24 DIAGNOSIS — Z9221 Personal history of antineoplastic chemotherapy: Secondary | ICD-10-CM | POA: Diagnosis not present

## 2020-12-24 DIAGNOSIS — Z853 Personal history of malignant neoplasm of breast: Secondary | ICD-10-CM | POA: Diagnosis not present

## 2020-12-24 DIAGNOSIS — C50412 Malignant neoplasm of upper-outer quadrant of left female breast: Secondary | ICD-10-CM | POA: Diagnosis present

## 2020-12-24 DIAGNOSIS — C9110 Chronic lymphocytic leukemia of B-cell type not having achieved remission: Secondary | ICD-10-CM | POA: Diagnosis present

## 2020-12-24 DIAGNOSIS — Z171 Estrogen receptor negative status [ER-]: Secondary | ICD-10-CM | POA: Insufficient documentation

## 2020-12-24 DIAGNOSIS — Z923 Personal history of irradiation: Secondary | ICD-10-CM | POA: Diagnosis not present

## 2020-12-24 LAB — COMPREHENSIVE METABOLIC PANEL
ALT: 17 U/L (ref 0–44)
AST: 21 U/L (ref 15–41)
Albumin: 4 g/dL (ref 3.5–5.0)
Alkaline Phosphatase: 57 U/L (ref 38–126)
Anion gap: 10 (ref 5–15)
BUN: 16 mg/dL (ref 8–23)
CO2: 25 mmol/L (ref 22–32)
Calcium: 9.8 mg/dL (ref 8.9–10.3)
Chloride: 104 mmol/L (ref 98–111)
Creatinine, Ser: 1 mg/dL (ref 0.44–1.00)
GFR, Estimated: 56 mL/min — ABNORMAL LOW (ref >60)
Glucose, Bld: 95 mg/dL (ref 70–99)
Potassium: 4.4 mmol/L (ref 3.5–5.1)
Sodium: 139 mmol/L (ref 135–145)
Total Bilirubin: 0.8 mg/dL (ref 0.3–1.2)
Total Protein: 6.8 g/dL (ref 6.5–8.1)

## 2020-12-24 LAB — MAGNESIUM: Magnesium: 2.1 mg/dL (ref 1.7–2.4)

## 2020-12-24 LAB — CBC WITH DIFFERENTIAL/PLATELET
Abs Immature Granulocytes: 0.18 10*3/uL — ABNORMAL HIGH (ref 0.00–0.07)
Basophils Absolute: 0.2 10*3/uL — ABNORMAL HIGH (ref 0.0–0.1)
Basophils Relative: 1 %
Eosinophils Absolute: 0.3 10*3/uL (ref 0.0–0.5)
Eosinophils Relative: 1 %
HCT: 35.9 % — ABNORMAL LOW (ref 36.0–46.0)
Hemoglobin: 11.7 g/dL — ABNORMAL LOW (ref 12.0–15.0)
Immature Granulocytes: 1 %
Lymphocytes Relative: 65 %
Lymphs Abs: 16.4 10*3/uL — ABNORMAL HIGH (ref 0.7–4.0)
MCH: 29 pg (ref 26.0–34.0)
MCHC: 32.6 g/dL (ref 30.0–36.0)
MCV: 88.9 fL (ref 80.0–100.0)
Monocytes Absolute: 0.8 10*3/uL (ref 0.1–1.0)
Monocytes Relative: 3 %
Neutro Abs: 7.2 10*3/uL (ref 1.7–7.7)
Neutrophils Relative %: 29 %
Platelets: 160 10*3/uL (ref 150–400)
RBC: 4.04 MIL/uL (ref 3.87–5.11)
RDW: 14.5 % (ref 11.5–15.5)
WBC: 24.9 10*3/uL — ABNORMAL HIGH (ref 4.0–10.5)
nRBC: 0 % (ref 0.0–0.2)

## 2020-12-24 LAB — PHOSPHORUS: Phosphorus: 4.1 mg/dL (ref 2.5–4.6)

## 2020-12-31 ENCOUNTER — Other Ambulatory Visit
Admission: RE | Admit: 2020-12-31 | Discharge: 2020-12-31 | Disposition: A | Discharge status: Home / Self Care | Payer: Medicare HMO | Source: Ambulatory Visit | Attending: General Surgery | Admitting: General Surgery

## 2020-12-31 NOTE — Patient Instructions (Addendum)
Your procedure is scheduled on: 01/04/2010- Friday Report to the Registration Desk on the 1st floor of the [facility]. To find out your arrival time, please call [phone] between 1PM - 3PM on: 01/03/2010- THURSDAY  REMEMBER: Instructions that are not followed completely may result in serious medical risk, up to and including death; or upon the discretion of your surgeon and anesthesiologist your surgery may need to be rescheduled.  Do not eat food after midnight the night before surgery.  No gum chewing, lozengers or hard candies.  You may however, drink CLEAR liquids up to 2 hours before you are scheduled to arrive for your surgery. Do not drink anything within 2 hours of your scheduled arrival time.  Clear liquids include: - water  - apple juice without pulp - gatorade (not RED, PURPLE, OR BLUE) - black coffee or tea (Do NOT add milk or creamers to the coffee or tea) Do NOT drink anything that is not on this list.   TAKE THESE MEDICATIONS THE MORNING OF SURGERY WITH A SIP OF WATER: NA  One week prior to surgery: Stop Anti-inflammatories (NSAIDS) such as Advil, Aleve, Ibuprofen, Motrin, Naproxen, Naprosyn and Aspirin based products such as Excedrin, Goodys Powder, BC Powder.  Stop ANY OVER THE COUNTER supplements until after surgery : STOP TAKING 12/31/20 Magnesium Oxide 250 MG TABS, Potassium 99 MG TABS (However, you may continue taking Vitamin D, Vitamin B, and multivitamin up until the day before surgery.)  No Alcohol for 24 hours before or after surgery.  No Smoking including e-cigarettes for 24 hours prior to surgery.  No chewable tobacco products for at least 6 hours prior to surgery.  No nicotine patches on the day of surgery.  Do not use any "recreational" drugs for at least a week prior to your surgery.  Please be advised that the combination of cocaine and anesthesia may have negative outcomes, up to and including death. If you test positive for cocaine, your  surgery will be cancelled.  On the morning of surgery brush your teeth with toothpaste and water, you may rinse your mouth with mouthwash if you wish. Do not swallow any toothpaste or mouthwash.  Do not wear jewelry, make-up, hairpins, clips or nail polish.  Do not wear lotions, powders, or perfumes.   Do not shave body from the neck down 48 hours prior to surgery just in case you cut yourself which could leave a site for infection.  Also, freshly shaved skin may become irritated if using the CHG soap.  Contact lenses, hearing aids and dentures may not be worn into surgery.  Do not bring valuables to the hospital. [facility] is not responsible for any missing/lost belongings or valuables.   Use CHG Soap or wipes as directed on instruction sheet.  Notify your doctor if there is any change in your medical condition (cold, fever, infection).  Wear comfortable clothing (specific to your surgery type) to the hospital.  Plan for stool softeners for home use; pain medications have a tendency to cause constipation. You can also help prevent constipation by eating foods high in fiber such as fruits and vegetables and drinking plenty of fluids as your diet allows.  After surgery, you can help prevent lung complications by doing breathing exercises.  Take deep breaths and cough every 1-2 hours. Your doctor may order a device called an Incentive Spirometer to help you take deep breaths. When coughing or sneezing, hold a pillow firmly against your incision with both hands. This is  called "splinting." Doing this helps protect your incision. It also decreases belly discomfort.  If you are being admitted to the hospital overnight, leave your suitcase in the car. After surgery it may be brought to your room.  If you are being discharged the day of surgery, you will not be allowed to drive home. You will need a responsible adult (18 years or older) to drive you home and stay with you that night.    If you are taking public transportation, you will need to have a responsible adult (18 years or older) with you. Please confirm with your physician that it is acceptable to use public transportation.   Please call the [facility] at [phone] if you have any questions about these instructions.  Visitation Policy:  Patients undergoing a surgery or procedure may have one family member or support person with them as long as that person is not COVID-19 positive or experiencing its symptoms.  That person may remain in the waiting area during the procedure.  Inpatient Visitation:    Visiting hours are 7 a.m. to 8 p.m. Patients will be allowed one visitor. The visitor may change daily. The visitor must pass COVID-19 screenings, use hand sanitizer when entering and exiting the patient's room and wear a mask at all times, including in the patient's room. Patients must also wear a mask when staff or their visitor are in the room. Masking is required regardless of vaccination status. Systemwide, no visitors 17 or younger.

## 2021-01-02 ENCOUNTER — Other Ambulatory Visit: Payer: Self-pay

## 2021-01-02 ENCOUNTER — Other Ambulatory Visit
Admission: RE | Admit: 2021-01-02 | Discharge: 2021-01-02 | Disposition: A | Discharge status: Home / Self Care | Payer: Medicare HMO | Source: Ambulatory Visit | Attending: General Surgery | Admitting: General Surgery

## 2021-01-02 DIAGNOSIS — Z01812 Encounter for preprocedural laboratory examination: Secondary | ICD-10-CM | POA: Insufficient documentation

## 2021-01-02 DIAGNOSIS — Z20822 Contact with and (suspected) exposure to covid-19: Secondary | ICD-10-CM | POA: Diagnosis not present

## 2021-01-02 LAB — SARS CORONAVIRUS 2 (TAT 6-24 HRS): SARS Coronavirus 2: NEGATIVE

## 2021-01-04 ENCOUNTER — Ambulatory Visit: Admission: RE | Admit: 2021-01-04 | Payer: Medicare HMO | Source: Home / Self Care | Admitting: General Surgery

## 2021-01-08 MED FILL — IMBRUVICA 420 MG TAB: 420 | 28 days supply | Qty: 28 | Fill #2

## 2021-01-14 ENCOUNTER — Other Ambulatory Visit: Payer: Self-pay | Admitting: General Surgery

## 2021-01-14 NOTE — Progress Notes (Signed)
Subjective:     Patient ID: Megan Santana is a 87 y.o. female.  HPI  The following portions of the patient's history were reviewed and updated as appropriate.  This an established patient is here today for: office visit. The patient is here today to discuss having a double mastectomy.       Chief Complaint  Patient presents with  . Follow-up     BP (!) 174/60   Pulse 65   Temp 36.7 C (98 F)   Ht 157.5 cm (5\' 2" )   Wt 74.3 kg (163 lb 14.4 oz)   SpO2 97%   BMI 29.98 kg/m       Past Medical History:  Diagnosis Date  . Arthritis   . Breast cancer (CMS-HCC) 2007   right  . Breast cancer (CMS-HCC) 03/31/2017   Left upper outer quadrant, T1c, N0, whole breast.  Triple negative  . Breast cancer (CMS-HCC) 11/20/2020   Left lower outer quadrant, DCIS, high-grade  . Cardiac murmur   . Chronic lymphocytic leukemia (CMS-HCC) 09/2019  . Family history of adverse reaction to anesthesia   . GERD (gastroesophageal reflux disease)   . H/O: hysterectomy 05/1980  . History of chemotherapy 2018  . History of radiation therapy 2007  . History of radiation therapy 2008  . Hyperlipidemia   . Lumbar radiculitis   . Skin cancer of face 06/2011  . Spinal stenosis of lumbar region           Past Surgical History:  Procedure Laterality Date  . ABDOMINAL HYSTERECTOMY  05/1980  . Back Surgery  06/1997  . CATARACT EXTRACTION Bilateral   . COLONOSCOPY  02/26/2010  . MASTECTOMY PARTIAL / LUMPECTOMY Right 2007  . MASTECTOMY PARTIAL / LUMPECTOMY Left 04/2017  . Open Reduction and internal fixation, left distal radius Left 10/27/2013  . Tumors Removed  01/2000   non cancer tumor              OB History    Gravida  3   Para  3   Term      Preterm      AB      Living        SAB      IAB      Ectopic      Molar      Multiple      Live Births          Obstetric Comments  Age at first period 18 Age of first pregnancy 68          Social History          Socioeconomic History  . Marital status: Married    Spouse name: Not on file  . Number of children: Not on file  . Years of education: Not on file  . Highest education level: Not on file  Occupational History  . Not on file  Tobacco Use  . Smoking status: Former Smoker    Packs/day: 0.50    Years: 10.00    Pack years: 5.00  . Smokeless tobacco: Never Used  Substance and Sexual Activity  . Alcohol use: Yes    Comment: occasionally  . Drug use: No  . Sexual activity: Not on file  Other Topics Concern  . Not on file  Social History Narrative  . Not on file   Social Determinants of Health   Financial Resource Strain: Not on file  Food Insecurity: Not on file  Transportation Needs: Not on  file            Allergies  Allergen Reactions  . Oxycodone Other (See Comments)    Can't sleep, feels bad when taking.    Current Medications        Current Outpatient Medications  Medication Sig Dispense Refill  . ascorbic acid (VITAMIN C) 500 MG tablet Take 500 mg by mouth once daily.    . calcium carbonate (TUMS E-X) 300 mg (750 mg) chewable tablet Take by mouth    . calcium carbonate-vitamin D3 500 mg(1,250mg ) -125 unit per tablet Take 600 mg by mouth 2 (two) times daily.    . cholecalciferol (VITAMIN D3) 1000 unit tablet Take by mouth    . cyclobenzaprine (FLEXERIL) 10 MG tablet Take 10 mg by mouth 3 (three) times daily as needed    . ibuprofen (ADVIL,MOTRIN) 200 MG tablet Take 800 mg by mouth as needed for Pain.    . IMBRUVICA 420 mg Tab TAKE 1 TABLET (420MG ) BY MOUTH DAILY    . lidocaine-prilocaine (EMLA) cream Apply to areola one hour prior to arrival day of procedure. Cover with saran wrap. 5 g 0  . magnesium oxide (MAG-OX) 400 mg (241.3 mg magnesium) tablet Take by mouth    . multivitamin tablet Take 1 tablet by mouth once daily.    Marland Kitchen POTASSIUM CHLORIDE ORAL Take 595 mg by mouth 2 (two) times daily.     Marland Kitchen aspirin 81 MG EC tablet Take 81 mg by mouth once daily. (Patient not taking: Reported on 11/22/2020  )     No current facility-administered medications for this visit.           Family History  Problem Relation Age of Onset  . Lung cancer Mother   . Lung cancer Father   . Stroke Sister   . Breast cancer Sister   . Myocardial Infarction (Heart attack) Brother   . Breast cancer Maternal Aunt   . Colon cancer Neg Hx        Review of Systems  Constitutional: Negative for chills and fever.  Respiratory: Negative for cough.        Objective:   Physical Exam Constitutional:      Appearance: Normal appearance.  Cardiovascular:     Rate and Rhythm: Normal rate and regular rhythm.  Pulmonary:     Effort: Pulmonary effort is normal.     Breath sounds: Normal breath sounds.  Neurological:     Mental Status: She is alert and oriented to person, place, and time.  Psychiatric:        Mood and Affect: Mood normal.        Behavior: Behavior normal.     Labs and Radiology:   November 20, 2020 left breast biopsy:  DIAGNOSIS:  A. LEFT BREAST, LOWER OUTER QUADRANT CALCIFICATIONS; STEREOTACTIC  BIOPSY:  - DUCTAL CARCINOMA IN SITU, HIGH GRADE WITH COMEDONECROSIS AND  CALCIFICATIONS    Assessment:     High-grade DCIS of the left breast, new primary in previously treated breast.  (Previous triple negative tumor invasive.)    Plan:     The patient has waxed and waned between unilateral and bilateral mastectomy.  Surgery was canceled last week as several family members were sick and she was concerned that she might become ill in the immediate postoperative period.  She is now doing well as is the rest of her family.  We reviewed the data that shows no life extension with prophylactic mastectomy.  She is concerned about  staying in the hospital and for that reason unilateral mastectomy is her best option.  She has a great grandchild due February 14 and  would like to defer surgery until the middle of May.  In light of the above-noted pathology I think this is reasonable.  Surgery to be scheduled for 02-25-21 at [facility].  In speaking with the director for day surgery new orders will not be required.  She will require repeat Covid testing.    Entered by Ledell Noss, CMA, acting as a scribe for Dr. Hervey Ard, MD.   The documentation recorded by the scribe accurately reflects the service I personally performed and the decisions made by me.   Robert Bellow, MD FACS

## 2021-01-22 ENCOUNTER — Other Ambulatory Visit: Payer: Self-pay | Admitting: Family Medicine

## 2021-01-22 ENCOUNTER — Ambulatory Visit
Admission: RE | Admit: 2021-01-22 | Discharge: 2021-01-22 | Disposition: A | Discharge status: Home / Self Care | Payer: Medicare HMO | Source: Ambulatory Visit | Attending: Family Medicine | Admitting: Family Medicine

## 2021-01-22 ENCOUNTER — Ambulatory Visit
Admission: RE | Admit: 2021-01-22 | Discharge: 2021-01-22 | Disposition: A | Discharge status: Home / Self Care | Payer: Medicare HMO | Attending: Family Medicine | Admitting: Family Medicine

## 2021-01-22 DIAGNOSIS — M545 Low back pain, unspecified: Secondary | ICD-10-CM | POA: Diagnosis present

## 2021-01-23 NOTE — Pre-Procedure Instructions (Addendum)
Attempted to contact patient re: surgery rescheduled. Message left for patient to call [phone] if she has any questions about preadmission instructions or does not have instructions or CHG soap to use before surgery. Message was left reminding pt to call [phone] on Friday February 22, 2021  Between 1-3 pm to find our arrival time for Monday, February 25, 2021.

## 2021-02-02 IMAGING — MG MM BREAST BX W LOC DEV 1ST LESION IMAGE BX SPEC STEREO GUIDE*L*
5 series · 5 of 5 positions shown · non-contrast
Comparison: Previous exams.
COMPARISON: Previous exams.

Addendum:
CLINICAL DATA: 86-year-old with a highly suspicious approximate 4
cm group of coarse heterogeneous and fine pleomorphic calcifications
involving the LOWER OUTER QUADRANT of the LEFT breast. Biopsy of the
coarse calcifications posteriorly is performed at the request of Dr.
Dubey.

Personal history of BILATERAL malignant lumpectomies, in the RIGHT
breast in 2112 and in the LEFT breast in 6723.
EXAM:
LEFT BREAST STEREOTACTIC CORE NEEDLE BIOPSY

[L (1 of 5)]
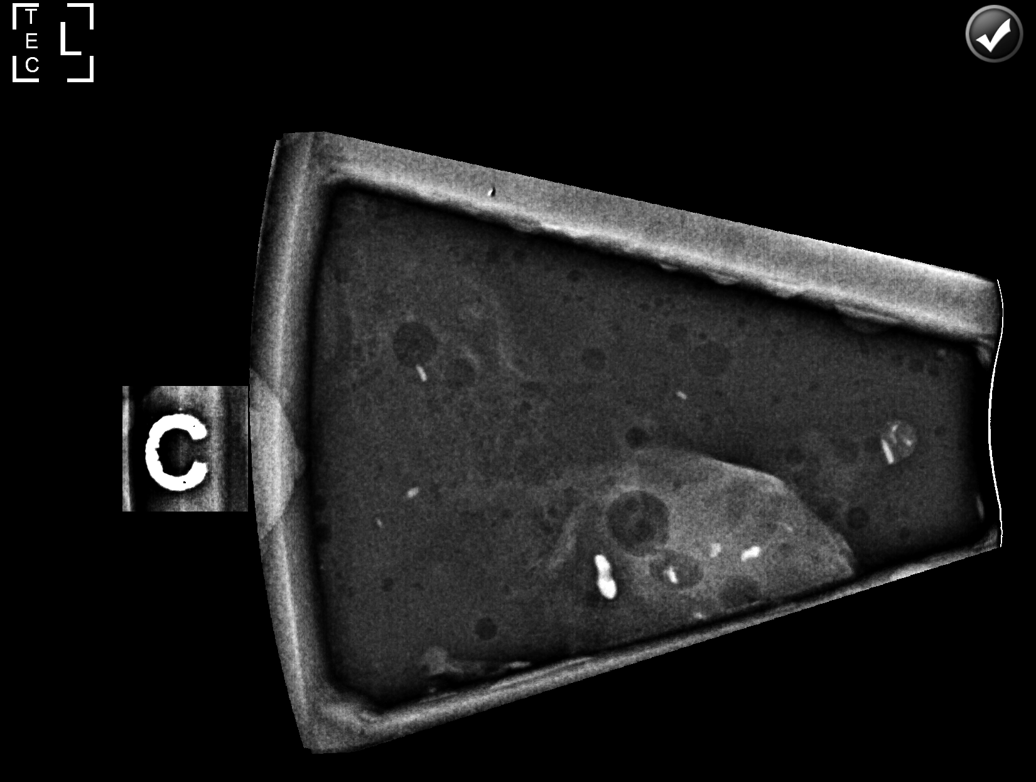

[L (2 of 5)]
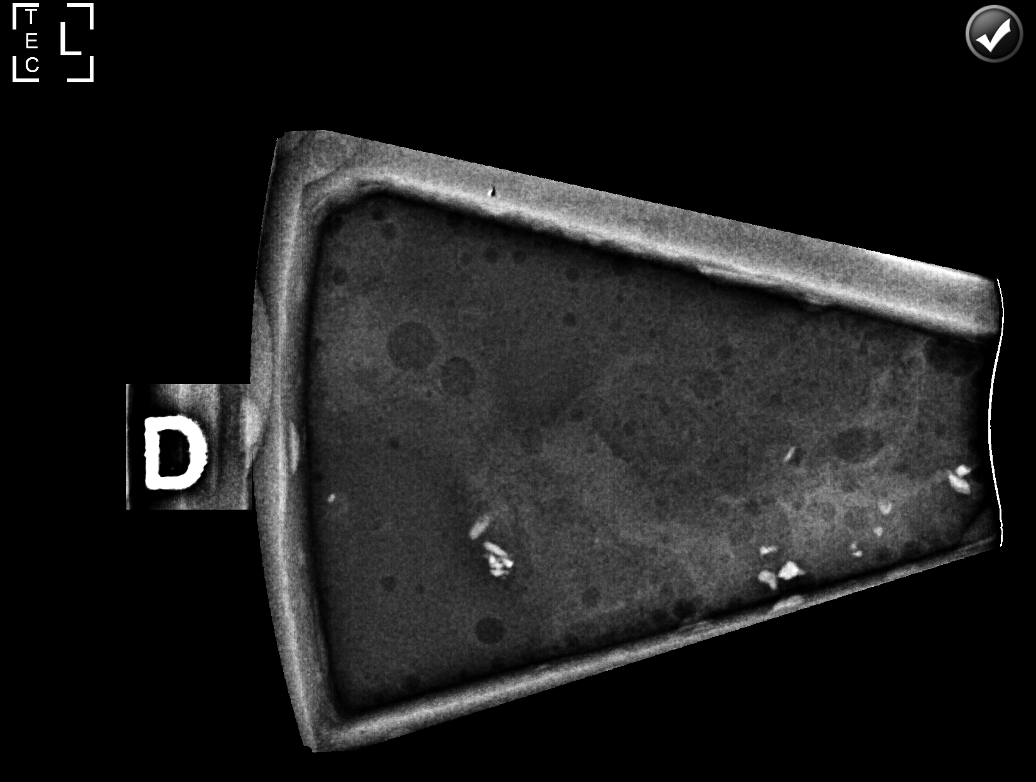

[L (3 of 5)]
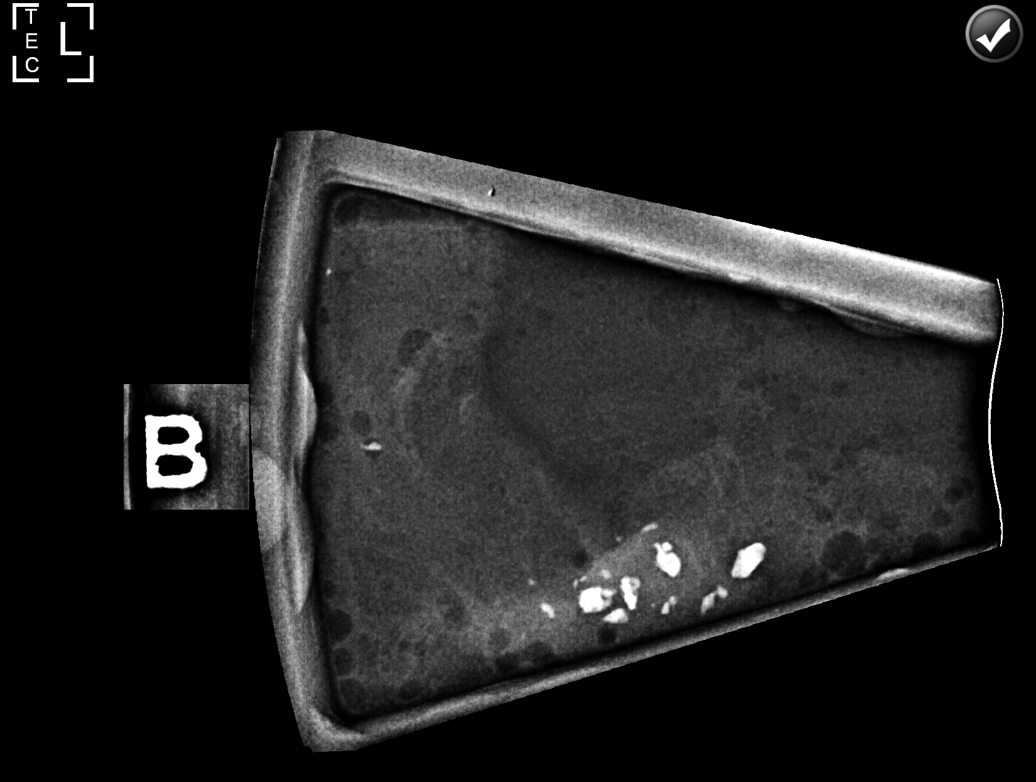

[L (4 of 5)]
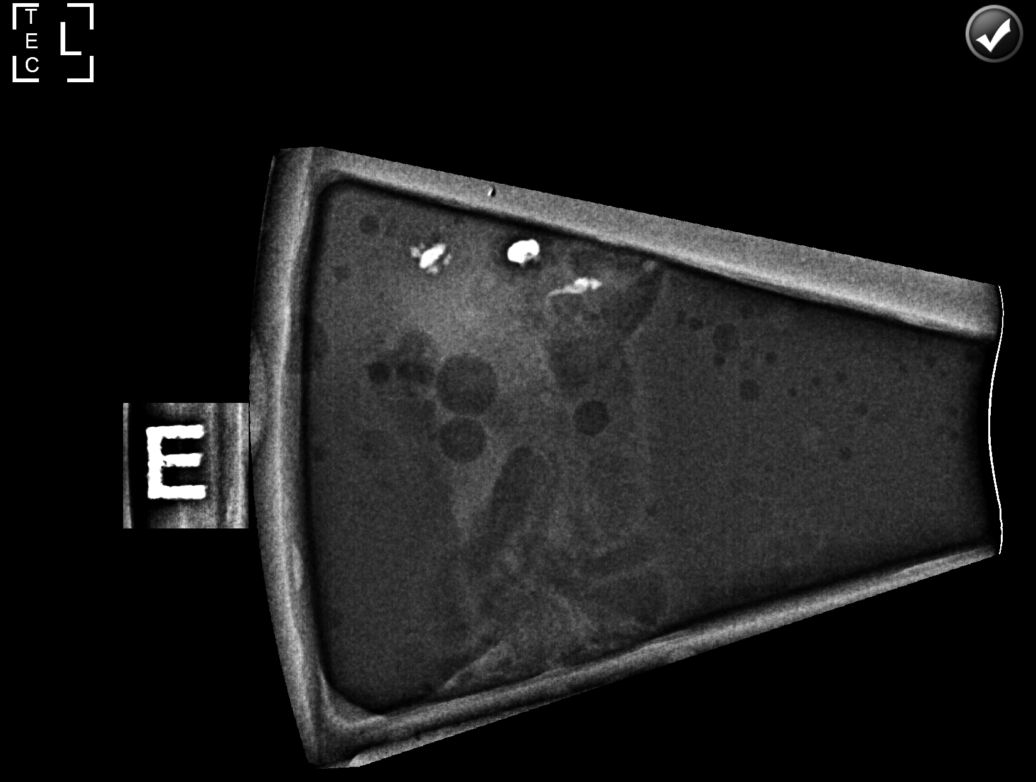

[L (5 of 5)]
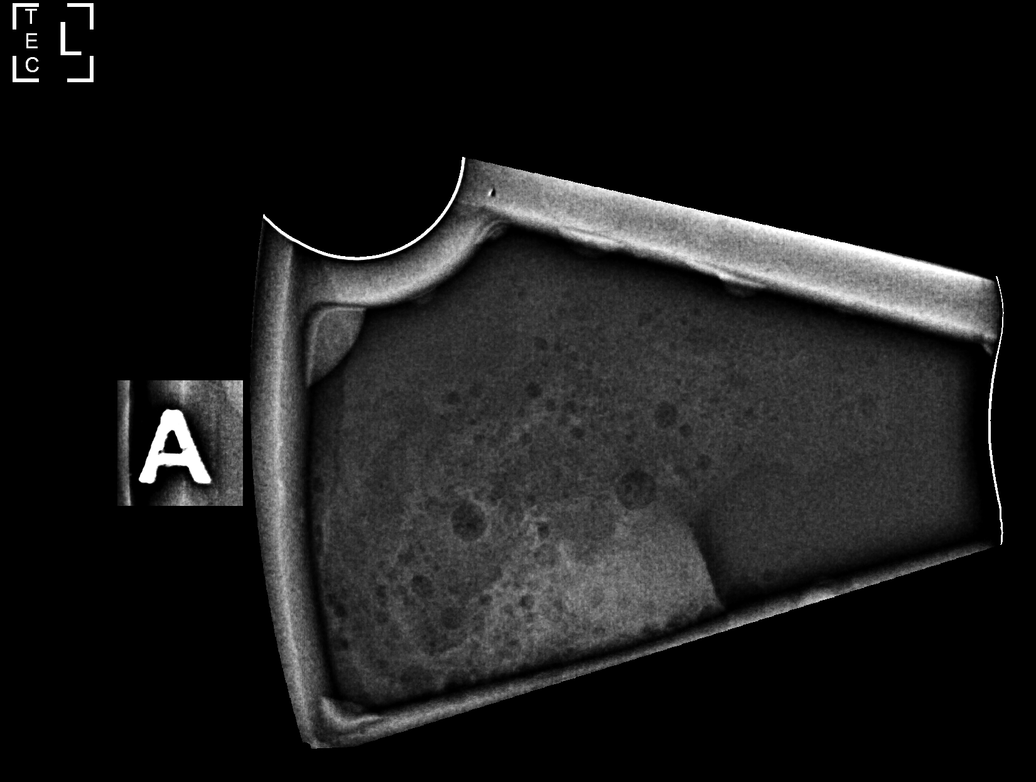

[5 of 5 positions shown; findings below may reference images not displayed]



Lesion quadrant: LOWER OUTER QUADRANT.

Using sterile technique with chlorhexidine as skin antisepsis, 1%
lidocaine and 1% lidocaine with epinephrine as local anesthetic,
under stereotactic guidance, a 9 gauge Brevera vacuum assisted
device was used to perform core needle biopsy of calcifications
involving the LOWER OUTER QUADRANT of the LEFT breast using a
lateral approach. Specimen radiograph was performed showing
calcifications in at least 4 of the core samples. Specimens with
calcifications are identified for pathology.

At the conclusion of the procedure, an X shaped tissue marker clip
was deployed into the biopsy cavity. Follow-up 2-view mammogram was
performed and dictated separately.
IMPRESSION: Stereotactic-guided biopsy of coarse heterogeneous calcifications in
the LOWER OUTER QUADRANT of the LEFT breast which are located at the
posterior margin of a 4 cm group of highly suspicious
calcifications. No apparent complications.

ADDENDUM:
PATHOLOGY revealed: A. LEFT BREAST, LOWER OUTER QUADRANT
CALCIFICATIONS; STEREOTACTIC BIOPSY: - DUCTAL CARCINOMA IN SITU,
HIGH GRADE WITH COMEDONECROSIS AND CALCIFICATIONS. Comment: ER is
deferred to the excision specimen. DCIS involves 4 of 5 tissue
blocks.

Pathology results are CONCORDANT with imaging findings, per Dr.
Naa Korkor [place].

Pathology results and recommendations below were discussed with
patient by telephone on 11/22/2020. Patient reported no issues at the
biopsy site apart from mild tenderness. Post biopsy care
instructions were reviewed, questions were answered and my direct
phone number was provided to patient. Patient was instructed to call
the [HOSPITAL] if any concerns or questions arise
related to the biopsy.

RECOMMENDATION:

1. If breast sparing surgery is contemplated, the anterior extent of
the breast calcifications should be biopsied as well as a separate
group of linear calcifications superior to the biopsied
calcifications.

2. Consider bilateral breast MRI due to high grade DCIS and to
evaluate extent of breast disease.

3. Surgical consultation: Patient has standing appointment with Dr.
Nya Jumper on [REDACTED] 11/27/2020. Cubillas Hecht RN and Rohan
Shanaka RN at [HOSPITAL] [HOSPITAL] were made aware of
patient's surgical consultation appointment.

Pathology results reported by Hmade Toli RN on 11/22/2020.



Lesion quadrant: LOWER OUTER QUADRANT.

Using sterile technique with chlorhexidine as skin antisepsis, 1%
lidocaine and 1% lidocaine with epinephrine as local anesthetic,
under stereotactic guidance, a 9 gauge Brevera vacuum assisted
device was used to perform core needle biopsy of calcifications
involving the LOWER OUTER QUADRANT of the LEFT breast using a
lateral approach. Specimen radiograph was performed showing
calcifications in at least 4 of the core samples. Specimens with
calcifications are identified for pathology.

At the conclusion of the procedure, an X shaped tissue marker clip
was deployed into the biopsy cavity. Follow-up 2-view mammogram was
performed and dictated separately.
IMPRESSION: Stereotactic-guided biopsy of coarse heterogeneous calcifications in
the LOWER OUTER QUADRANT of the LEFT breast which are located at the
posterior margin of a 4 cm group of highly suspicious
calcifications. No apparent complications.

## 2021-02-03 NOTE — Progress Notes (Deleted)
[place]  Telephone:([phone]) [phone] Fax:(336) [phone]  ID: Megan Santana OB: 06/09/1934  MR#: [id number]  AXK#:[id number]  Patient Care Team: Lynnell Jude, MD as PCP - General (Family Medicine) Rico Junker, RN as Oncology Nurse Navigator Grayland Ormond, Kathlene November, MD as Consulting Physician (Oncology) Clemmie Krill Lynnell Jude, MD as Referring Physician (Family Medicine) Bary Castilla, Forest Gleason, MD (General Surgery) Noreene Filbert, MD as Referring Physician (Radiation Oncology)   CHIEF COMPLAINT:  CLL, q13 deletion  INTERVAL HISTORY: Patient returns to clinic today for further evaluation and continuation of Imbruvica.  She is tolerating treatments well without significant side effects.  She does not complain of any weakness or fatigue.  Her anxiety and insomnia have essentially resolved.  She does not complain of flank pain today.  She has no neurologic complaints. She denies any recent fevers or illnesses.  She denies any night sweats or unintentional weight loss.  She has no chest pain, shortness of breath, cough, or hemoptysis.  She denies any nausea, vomiting, constipation, or diarrhea. She has no urinary complaints.  Patient offers no specific complaints today.  REVIEW OF SYSTEMS:   Review of Systems  Constitutional: Negative.  Negative for fever, malaise/fatigue and weight loss.  Respiratory: Negative.  Negative for cough, hemoptysis and shortness of breath.   Cardiovascular: Negative.  Negative for chest pain and leg swelling.  Gastrointestinal: Negative.  Negative for abdominal pain and constipation.  Genitourinary: Negative.  Negative for dysuria and flank pain.  Musculoskeletal: Negative for back pain and joint pain.  Skin: Negative.  Negative for rash.  Neurological: Negative.  Negative for sensory change, focal weakness, weakness and headaches.  Psychiatric/Behavioral: Negative.  The patient is not nervous/anxious and does not have insomnia.     As per HPI.  Otherwise, a complete review of systems is negative.  PAST MEDICAL HISTORY: Past Medical History:  Diagnosis Date  . Arthritis   . Breast cancer [facility]) 2007   right lumpectomy  . Breast cancer ([place]) 03/31/2017   16 mm invasive mammary carcinoma, left upper outer quadrant, T1c, N0, triple negative, histologic grade 3.  . Family history of adverse reaction to anesthesia    mom n/v  . GERD (gastroesophageal reflux disease)   . Heart murmur   . Lumbar radiculitis   . Lumbar stenosis   . Personal history of chemotherapy 2018   Left  . Personal history of radiation therapy 2007   Right  . Personal history of radiation therapy 2018   Left    PAST SURGICAL HISTORY: Past Surgical History:  Procedure Laterality Date  . ABDOMINAL HYSTERECTOMY     total  . BACK SURGERY     lumbar  . BREAST BIOPSY Bilateral 03/31/2017   bilat u/s core INVASIVE MAMMARY CARCINOMA  . BREAST BIOPSY Right 03/31/2017   DENSE FIBROSIS AND SHEETS OF MACROPHAGES  . BREAST EXCISIONAL BIOPSY Left 2018  . BREAST LUMPECTOMY Right 2007  . BREAST LUMPECTOMY Left 04/23/2017  . BREAST LUMPECTOMY Left 04/23/2017   Procedure: BREAST LUMPECTOMY WITH EXCISION OF SENTINEL NODE;  Surgeon: Robert Bellow, MD;  Whole breast radiation ORS;  Service: General;  Laterality: Left;  . BREAST SURGERY Right    Wide excision  . COLONOSCOPY    . EYE SURGERY     cataracts bil  . FRACTURE SURGERY Left 2015 or 2016  . TUMOR EXCISION  2001    FAMILY HISTORY: Family History  Problem Relation Age of Onset  . Breast cancer Sister 70  .  Lung cancer Mother 52       mets to breast  . Lung cancer Father 44  . Heart attack Brother   . Breast cancer Maternal Aunt        age unknown  . Stroke Sister 69    ADVANCED DIRECTIVES (Y/N):  N  HEALTH MAINTENANCE: Social History   Tobacco Use  . Smoking status: Former Smoker    Packs/day: 0.25    Years: 10.00    Pack years: 2.50    Types: Cigarettes    Quit date: 1970    Years  since quitting: 52.1  . Smokeless tobacco: Never Used  Vaping Use  . Vaping Use: Never used  Substance Use Topics  . Alcohol use: Not Currently  . Drug use: No     Colonoscopy:  PAP:  Bone density:  Lipid panel:  Allergies  Allergen Reactions  . Oxycodone Other (See Comments)    Can't sleep, feels bad when taking.    Current Outpatient Medications  Medication Sig Dispense Refill  . acetaminophen (TYLENOL) 500 MG tablet Take 1,000 mg by mouth every 6 (six) hours as needed (for pain.).    Marland Kitchen ALPRAZolam (XANAX) 0.25 MG tablet Take 1 tablet (0.25 mg total) by mouth at bedtime as needed for anxiety. and sleep. 30 tablet 0  . calcium carbonate (TUMS EX) 750 MG chewable tablet Chew 1-2 tablets by mouth 3 (three) times daily as needed (for heartburn/indigestion).    . cyclobenzaprine (FLEXERIL) 10 MG tablet Take 1 tablet (10 mg total) by mouth 3 (three) times daily as needed for muscle spasms. 60 tablet 0  . HYDROcodone-acetaminophen (NORCO) 5-325 MG tablet Take 1 tablet by mouth every 6 (six) hours as needed for moderate pain. 30 tablet 0  . ibrutinib (IMBRUVICA) 420 MG TABS Take 420 mg by mouth daily.    . Magnesium Oxide 250 MG TABS Take 250 mg by mouth daily.     . Multiple Minerals-Vitamins (CAL MAG ZINC +D3 PO) Take 1 tablet by mouth daily.    . Multiple Vitamins-Minerals (CENTRUM SILVER 50+WOMEN) TABS Take 1 tablet by mouth daily.    . Potassium 99 MG TABS Take 99 mg by mouth daily.      No current facility-administered medications for this visit.    OBJECTIVE: There were no vitals filed for this visit.   There is no height or weight on file to calculate BMI.    ECOG FS:0 - Asymptomatic  General: Well-developed, well-nourished, no acute distress. Eyes: Pink conjunctiva, anicteric sclera. HEENT: Normocephalic, moist mucous membranes. Lungs: No audible wheezing or coughing. Heart: Regular rate and rhythm. Abdomen: Soft, nontender, no obvious distention. Musculoskeletal: No  edema, cyanosis, or clubbing. Neuro: Alert, answering all questions appropriately. Cranial nerves grossly intact. Skin: No rashes or petechiae noted. Psych: Normal affect.   LAB RESULTS:  Lab Results  Component Value Date   NA 139 12/24/2020   K 4.4 12/24/2020   CL 104 12/24/2020   CO2 25 12/24/2020   GLUCOSE 95 12/24/2020   BUN 16 12/24/2020   CREATININE 1.00 12/24/2020   CALCIUM 9.8 12/24/2020   PROT 6.8 12/24/2020   ALBUMIN 4.0 12/24/2020   AST 21 12/24/2020   ALT 17 12/24/2020   ALKPHOS 57 12/24/2020   BILITOT 0.8 12/24/2020   GFRNONAA 56 (L) 12/24/2020   GFRAA >60 09/10/2020    Lab Results  Component Value Date   WBC 24.9 (H) 12/24/2020   NEUTROABS 7.2 12/24/2020   HGB 11.7 (L) 12/24/2020  HCT 35.9 (L) 12/24/2020   MCV 88.9 12/24/2020   PLT 160 12/24/2020     STUDIES: DG Lumbar Spine Complete  Result Date: 01/22/2021 CLINICAL DATA:  Lumbar spine pain EXAM: LUMBAR SPINE - COMPLETE 4+ VIEW COMPARISON:  None. FINDINGS: 5 nonrib bearing lumbar-type vertebral bodies. Mild T11 vertebral body compression fracture. Remainder of the vertebral body heights are maintained. Dextroscoliosis of the thoracolumbar spine. Minimal grade 1 anterolisthesis of L3 on L4. No spondylolysis. Degenerative disease with disc height loss throughout the thoracic spine with facet arthropathy. SI joints are unremarkable. Abdominal aortic atherosclerosis. IMPRESSION: Age-indeterminate mild T11 vertebral body compression fracture. Electronically Signed   By: Kathreen Devoid   On: 01/22/2021 16:23    ASSESSMENT:  CLL, q13 deletion  PLAN:    1. CLL, q13 deletion.  CT scan results reviewed independently and reported as above with significant, widespread lymphadenopathy as well as massive splenomegaly causing flank pain.  Patient had a reaction to Rituxan therefore this has been discontinued.  Patient was initiated on 420 mg Imbruvica in October 2021.  Her white blood cell count has improved and is now  31.3.  Patient has been instructed to discontinue allopurinol when she completes this current prescription.  No further intervention is needed.  Return to clinic in 6 weeks for laboratory work only and then in 3 months for laboratory work, repeat imaging, and further evaluation.  Appreciate clinical pharmacy input.   2. 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 This was likely a second primary.  Patient completed 4 cycles of Taxotere and Cytoxan August 06, 2017.  Also completed adjuvant XRT.  An aromatase inhibitor would not be of any benefit given the ER/PR status of her tumor.  Patient's most recent mammogram on May 09, 2020 was reported as BI-RADS 4.  Patient and her surgeon agreed to wait 6 months with for repeat mammogram which is scheduled in the next several weeks.  3. 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 Oncotype score 17: Originally diagnosed in 2008. Patient completed 5 years of Arimidex in approximately August 2013. Biopsy of the right breast only revealed fat necrosis at the site of her previous MammoSite.  Mammogram and possible biopsy as above. 4. Genetic testing: Patient has a sister and mother both with breast cancer. 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 5.  Back pain: Patient does not complain of this today.  Patient reports she is no longer taking Flexeril. 6.  Flank pain: Patient does not complain of this today.  Secondary to splenomegaly.  Imbruvica as above.  Continue Vicodin sparingly.   7.  Anxiety: Improved, continue Xanax as needed.   Patient expressed understanding and was in agreement with this plan. She also understands that She can call clinic at any time with any questions, concerns, or complaints.   Cancer  Staging Carcinoma of upper-outer quadrant of left breast in female, estrogen receptor negative ([place]) Staging form: Breast, AJCC 8th Edition - Clinical stage from 04/08/2017: Stage IB (cT1c, cN0, cM0, G3, ER: Negative, PR: Negative, HER2: Negative) - Signed by Lloyd Huger, MD on 04/08/2017 Histologic grading system: 3 grade system Laterality: Right - Pathologic stage from 05/11/2017: Stage IB (pT1c, pN0, cM0, G3, ER: Negative, PR: Negative, HER2: Negative) - Signed by Delight Hoh  J, MD on 05/11/2017 Neoadjuvant therapy: No Histologic grading system: 3 grade system Laterality: Left   Lloyd Huger, MD   02/03/2021 9:51 AM

## 2021-02-04 ENCOUNTER — Other Ambulatory Visit: Payer: Self-pay

## 2021-02-04 ENCOUNTER — Ambulatory Visit
Admission: RE | Admit: 2021-02-04 | Discharge: 2021-02-04 | Disposition: A | Discharge status: Home / Self Care | Payer: Medicare HMO | Source: Ambulatory Visit | Attending: Family Medicine | Admitting: Family Medicine

## 2021-02-04 DIAGNOSIS — C9110 Chronic lymphocytic leukemia of B-cell type not having achieved remission: Secondary | ICD-10-CM | POA: Insufficient documentation

## 2021-02-04 LAB — POCT I-STAT CREATININE: Creatinine, Ser: 1 mg/dL (ref 0.44–1.00)

## 2021-02-04 MED ORDER — IOHEXOL 300 MG/ML  SOLN
100.0000 mL | Freq: Once | INTRAMUSCULAR | Status: AC | PRN
  Administered 2021-02-04: 10:00:00 100 mL via INTRAVENOUS

## 2021-02-06 ENCOUNTER — Telehealth: Payer: Self-pay | Admitting: *Deleted

## 2021-02-06 NOTE — Telephone Encounter (Signed)
Per Dr Grayland Ormond patient is to be rescheduled

## 2021-02-06 NOTE — Telephone Encounter (Signed)
Patient called stating that she has COVID and wants to know what to do about tomorrows appts. Please return her call [phone]

## 2021-02-07 ENCOUNTER — Inpatient Hospital Stay: Payer: Medicare HMO | Admitting: Oncology

## 2021-02-07 ENCOUNTER — Inpatient Hospital Stay: Payer: Medicare HMO

## 2021-02-07 ENCOUNTER — Inpatient Hospital Stay: Payer: Medicare HMO | Admitting: Pharmacist

## 2021-02-07 DIAGNOSIS — C9110 Chronic lymphocytic leukemia of B-cell type not having achieved remission: Secondary | ICD-10-CM

## 2021-02-07 MED FILL — IMBRUVICA 420 MG TAB: 420 | 28 days supply | Qty: 28 | Fill #3

## 2021-02-08 ENCOUNTER — Other Ambulatory Visit: Payer: Self-pay | Admitting: Oncology

## 2021-02-08 DIAGNOSIS — U071 COVID-19: Secondary | ICD-10-CM

## 2021-02-09 ENCOUNTER — Telehealth: Payer: Self-pay | Admitting: Adult Health

## 2021-02-09 ENCOUNTER — Other Ambulatory Visit: Payer: Self-pay | Admitting: Adult Health

## 2021-02-09 NOTE — Telephone Encounter (Signed)
Called to discuss with patient about COVID-19 symptoms and the use of one of the available treatments for those with mild to moderate Covid symptoms and at a high risk of hospitalization.  Pt appears to qualify for outpatient treatment due to co-morbid conditions and/or a member of an at-risk group in accordance with the FDA Emergency Use Authorization.    Symptom onset: 2/23?-min sx  Vaccinated: Yes  Booster? Yes  Immunocompromised? Yes  Qualifiers: Yes   Unable to reach pt - She declines MAB/Antiviral at this time. Will call back if changes mind.  Please contact PCP  for sooner follow up if symptoms do not improve or worsen or seek emergency care    Jordyne Poehlman NP-C

## 2021-02-12 ENCOUNTER — Telehealth: Payer: Self-pay | Admitting: Infectious Diseases

## 2021-02-12 NOTE — Telephone Encounter (Signed)
Received a VM from the patient on infusion hotline requesting a callback. Spoke with Tammy a few days ago re: covid treatment.   I LVM and went a [facility] to her to see how we can help.    Janene Madeira, MSN, NP-C [facility] [email or web address] Pager: [phone] Office: [phone] [place]: [phone]

## 2021-02-16 NOTE — Progress Notes (Signed)
[place]  [phone] [phone]  ID: Megan Santana OB: 10/22/1934  MR#: [id number]  TTS#:[id number]  Patient Care Team: Lynnell Jude, MD as PCP - General (Family Medicine) Rico Junker, RN as Oncology Nurse Navigator Lloyd Huger, MD as Consulting Physician (Oncology) Clemmie Krill Lynnell Jude, MD as Referring Physician (Family Medicine) Bary Castilla, Forest Gleason, MD (General Surgery) Noreene Filbert, MD as Referring Physician (Radiation Oncology)   CHIEF COMPLAINT:  CLL, q13 deletion.  DCIS left breast  INTERVAL HISTORY: Patient returns to clinic today for further evaluation, discussion of her imaging results, continuation of Imbruvica, as well as discussion of her newly diagnosed DCIS.  She currently feels well and is asymptomatic.  She is tolerating treatment without significant side effects.  She does not complain of any weakness or fatigue.  She continues to have anxiety.  She has no neurologic complaints. She denies any recent fevers or illnesses.  She denies any night sweats or unintentional weight loss.  She has no chest pain, shortness of breath, cough, or hemoptysis.  She denies any nausea, vomiting, constipation, or diarrhea. She has no urinary complaints.  Patient offers no specific complaints today.  REVIEW OF SYSTEMS:   Review of Systems  Constitutional: Negative.  Negative for fever, malaise/fatigue and weight loss.  Respiratory: Negative.  Negative for cough, hemoptysis and shortness of breath.   Cardiovascular: Negative.  Negative for chest pain and leg swelling.  Gastrointestinal: Negative.  Negative for abdominal pain and constipation.  Genitourinary: Negative.  Negative for dysuria and flank pain.  Musculoskeletal: Negative.  Negative for back pain and joint pain.  Skin: Negative.  Negative for rash.  Neurological: Negative.  Negative for sensory change, focal weakness, weakness and headaches.  Psychiatric/Behavioral: The  patient is nervous/anxious. The patient does not have insomnia.     As per HPI. Otherwise, a complete review of systems is negative.  PAST MEDICAL HISTORY: Past Medical History:  Diagnosis Date  . Arthritis   . Breast cancer [facility]) 2007   right lumpectomy  . Breast cancer ([place]) 03/31/2017   16 mm invasive mammary carcinoma, left upper outer quadrant, T1c, N0, triple negative, histologic grade 3.  . Family history of adverse reaction to anesthesia    mom n/v  . GERD (gastroesophageal reflux disease)   . Heart murmur   . Lumbar radiculitis   . Lumbar stenosis   . Personal history of chemotherapy 2018   Left  . Personal history of radiation therapy 2007   Right  . Personal history of radiation therapy 2018   Left    PAST SURGICAL HISTORY: Past Surgical History:  Procedure Laterality Date  . ABDOMINAL HYSTERECTOMY     total  . BACK SURGERY     lumbar  . BREAST BIOPSY Bilateral 03/31/2017   bilat u/s core INVASIVE MAMMARY CARCINOMA  . BREAST BIOPSY Right 03/31/2017   DENSE FIBROSIS AND SHEETS OF MACROPHAGES  . BREAST EXCISIONAL BIOPSY Left 2018  . BREAST LUMPECTOMY Right 2007  . BREAST LUMPECTOMY Left 04/23/2017  . BREAST LUMPECTOMY Left 04/23/2017   Procedure: BREAST LUMPECTOMY WITH EXCISION OF SENTINEL NODE;  Surgeon: Robert Bellow, MD;  Whole breast radiation ORS;  Service: General;  Laterality: Left;  . BREAST SURGERY Right    Wide excision  . COLONOSCOPY    . EYE SURGERY     cataracts bil  . FRACTURE SURGERY Left 2015 or 2016  . TUMOR EXCISION  2001    FAMILY HISTORY: Family History  Problem Relation Age of Onset  . Breast cancer Sister 48  . Lung cancer Mother 43       mets to breast  . Lung cancer Father 55  . Heart attack Brother   . Breast cancer Maternal Aunt        age unknown  . Stroke Sister 11    ADVANCED DIRECTIVES (Y/N):  N  HEALTH MAINTENANCE: Social History   Tobacco Use  . Smoking status: Former Smoker    Packs/day: 0.25     Years: 10.00    Pack years: 2.50    Types: Cigarettes    Quit date: 1970    Years since quitting: 52.2  . Smokeless tobacco: Never Used  Vaping Use  . Vaping Use: Never used  Substance Use Topics  . Alcohol use: Not Currently  . Drug use: No     Colonoscopy:  PAP:  Bone density:  Lipid panel:  Allergies  Allergen Reactions  . Oxycodone Other (See Comments)    Can't sleep, feels bad when taking.    Current Outpatient Medications  Medication Sig Dispense Refill  . acetaminophen (TYLENOL) 500 MG tablet Take 1,000 mg by mouth every 6 (six) hours as needed (for pain.).    Marland Kitchen calcium carbonate (TUMS EX) 750 MG chewable tablet Chew 1-2 tablets by mouth 3 (three) times daily as needed (for heartburn/indigestion).    . Magnesium Oxide 250 MG TABS Take 250 mg by mouth daily.     . Multiple Minerals-Vitamins (CAL MAG ZINC +D3 PO) Take 1 tablet by mouth daily.    . Multiple Vitamins-Minerals (CENTRUM SILVER 50+WOMEN) TABS Take 1 tablet by mouth daily.    . nitrofurantoin, macrocrystal-monohydrate, (MACROBID) 100 MG capsule Take 100 mg by mouth 2 (two) times daily.    . Potassium 99 MG TABS Take 99 mg by mouth daily.     Marland Kitchen ibrutinib (IMBRUVICA) 420 MG TABS Take 420 mg by mouth daily. 90 tablet 1   No current facility-administered medications for this visit.    OBJECTIVE: Vitals:   02/19/21 1117  BP: 128/73  Pulse: 71  Resp: 18  Temp: 99 F (37.2 C)  SpO2: 100%     Body mass index is 28.81 kg/m.    ECOG FS:0 - Asymptomatic  General: Well-developed, well-nourished, no acute distress. Eyes: Pink conjunctiva, anicteric sclera. HEENT: Normocephalic, moist mucous membranes. Lungs: No audible wheezing or coughing. Heart: Regular rate and rhythm. Abdomen: Soft, nontender, no obvious distention. Musculoskeletal: No edema, cyanosis, or clubbing. Neuro: Alert, answering all questions appropriately. Cranial nerves grossly intact. Skin: No rashes or petechiae noted. Psych: Normal  affect.  LAB RESULTS:  Lab Results  Component Value Date   NA 140 02/19/2021   K 4.2 02/19/2021   CL 106 02/19/2021   CO2 23 02/19/2021   GLUCOSE 95 02/19/2021   BUN 16 02/19/2021   CREATININE 0.90 02/19/2021   CALCIUM 9.0 02/19/2021   PROT 6.9 02/19/2021   ALBUMIN 3.6 02/19/2021   AST 24 02/19/2021   ALT 26 02/19/2021   ALKPHOS 67 02/19/2021   BILITOT 0.8 02/19/2021   GFRNONAA >60 02/19/2021   GFRAA >60 09/10/2020    Lab Results  Component Value Date   WBC 11.5 (H) 02/19/2021   NEUTROABS 5.7 02/19/2021   HGB 10.7 (L) 02/19/2021   HCT 32.8 (L) 02/19/2021   MCV 85.4 02/19/2021   PLT 156 02/19/2021     STUDIES: DG Lumbar Spine Complete  Result Date: 01/22/2021 CLINICAL DATA:  Lumbar spine pain  EXAM: LUMBAR SPINE - COMPLETE 4+ VIEW COMPARISON:  None. FINDINGS: 5 nonrib bearing lumbar-type vertebral bodies. Mild T11 vertebral body compression fracture. Remainder of the vertebral body heights are maintained. Dextroscoliosis of the thoracolumbar spine. Minimal grade 1 anterolisthesis of L3 on L4. No spondylolysis. Degenerative disease with disc height loss throughout the thoracic spine with facet arthropathy. SI joints are unremarkable. Abdominal aortic atherosclerosis. IMPRESSION: Age-indeterminate mild T11 vertebral body compression fracture. Electronically Signed   By: Kathreen Devoid   On: 01/22/2021 16:23   CT CHEST ABDOMEN PELVIS W CONTRAST  Result Date: 02/04/2021 CLINICAL DATA:  Follow-up chronic lymphocytic leukemia. History of bilateral breast cancer. EXAM: CT CHEST, ABDOMEN, AND PELVIS WITH CONTRAST TECHNIQUE: Multidetector CT imaging of the chest, abdomen and pelvis was performed following the standard protocol during bolus administration of intravenous contrast. CONTRAST:  126m OMNIPAQUE IOHEXOL 300 MG/ML  SOLN COMPARISON:  Breast imaging studies in November and December of 2021. CTs of the chest, abdomen and pelvis 08/13/2020. FINDINGS: CT CHEST FINDINGS Cardiovascular:  Fairly extensive atherosclerosis of the aorta, great vessels and coronary arteries again noted. No acute vascular findings are identified. There are calcifications of the aortic valve. The heart size is normal. There is no pericardial effusion. Mediastinum/Nodes: The previously demonstrated extensive mediastinal and axillary adenopathy has resolved. There are no residual enlarged mediastinal, hilar, axillary or internal mammary lymph nodes. The largest remaining nodes include a high right paraesophageal lobe measuring 7 mm on image 12/2 (previously 16 mm) and a right paratracheal node measuring 10 mm on image 20/2 (previously 3.2 cm). There is a stable small hiatal hernia. The thyroid gland and trachea appear normal. Lungs/Pleura: No pleural effusion or pneumothorax. Interval improved aeration of the lungs with resolution of the previously demonstrated linear opacities at both lung bases. No airspace disease or suspicious pulmonary nodule. Mild biapical scarring noted. Musculoskeletal/Chest wall: An area of probable fat necrosis centrally in the left breast measuring up to 2.8 cm on image 27/2 is stable. Chronic fluid collection laterally in the right breast with peripheral calcifications is unchanged, measuring 3.7 x 3.3 cm on image 32/2. No enlarging breast masses or suspicious chest wall lesions. No evidence of osseous metastatic disease. CT ABDOMEN AND PELVIS FINDINGS Hepatobiliary: The liver is normal in density without suspicious focal abnormality. No evidence of gallstones, gallbladder wall thickening or biliary dilatation. Pancreas: Unremarkable. No pancreatic ductal dilatation or surrounding inflammatory changes. Spleen: Previously demonstrated splenomegaly has resolved. The spleen is now normal in size without focal abnormality. Splenic height is 10.5 cm (previously 17.4 cm). Adrenals/Urinary Tract: Both adrenal glands appear normal. Stable small renal cortical and sinus cysts bilaterally. No evidence of  renal mass, urinary tract calculus or hydronephrosis. Findings of pelvic floor laxity with a small cystocele. Stomach/Bowel: The stomach appears unremarkable for its degree of distension. No evidence of bowel wall thickening, distention or surrounding inflammatory change. Vascular/Lymphatic: The previously identified enlarged abdominopelvic lymph nodes have all resolved. There is a small portacaval node measuring 9 mm on image 63/2 (previously 18 mm). No residual enlarged retroperitoneal, mesenteric or pelvic lymph nodes. Aortic and branch vessel atherosclerosis without acute vascular findings. The portal, superior mesenteric and splenic veins are patent. Reproductive: Hysterectomy. No adnexal mass. As above, probable pelvic floor laxity with a cystocele and possible rectocele. Other: Small periumbilical hernia, currently containing only fat. There is no residual herniation of the colon into this hernia. No ascites or peritoneal nodularity. Musculoskeletal: No evidence of osseous metastatic disease. There is a thoracolumbar scoliosis with associated  multilevel spondylosis. Interval development of a mild superior endplate compression fracture at T11, resulting in less than 20% loss of vertebral body height. This demonstrates no pathologic features. Moderate left-sided foraminal narrowing is present at L3-4. IMPRESSION: 1. Interval resolution of previously demonstrated extensive adenopathy throughout the chest, abdomen and pelvis, consistent with response to therapy. No residual adenopathy. 2. Similar resolution of splenomegaly, consistent with treated chronic lymphocytic leukemia. 3. No specific findings of recurrent or metastatic breast cancer. Postsurgical changes in both breasts are stable by CT. 4. Interval development of a mild superior endplate compression fracture at T11 without pathologic features. 5. Findings of pelvic floor laxity with a cystocele and possible rectocele. 6. Aortic Atherosclerosis  (ICD10-I70.0). Electronically Signed   By: Richardean Sale M.D.   On: 02/04/2021 14:55    ASSESSMENT:  CLL, q13 deletion.  DCIS left breast  PLAN:    1. CLL, q13 deletion.  CT scan results from February 04, 2021 reviewed independently and reported as above with essentially resolution of extensive lymphadenopathy throughout the chest, abdomen, pelvis.  Her white blood cell count has also trended down and is now 11.5.  Patient initiated 420 mg Imbruvica in October 2021 after a reaction to Rituxan.  Continue treatment as prescribed.  Patient will hold treatment for 7 days prior and 7 days after her surgery for DCIS.  Return to clinic in 1 month postoperatively for repeat laboratory work and further evaluation.  Consider repeat imaging in August 2022.  2.  Left breast DCIS: Patient has surgery scheduled for February 25, 2021.  She will likely benefit from tamoxifen.  Follow-up as above. 3.  History of pathologic stage IB triple negative invasive carcinoma of the upper outer quadrant of the left breast: Patient's previous breast cancer was in her right breast and was ER/PR positive. This was likely a second primary.  Patient completed 4 cycles of Taxotere and Cytoxan August 06, 2017.  Also completed adjuvant XRT.  An aromatase inhibitor would not be of any benefit given the ER/PR status of her tumor.   3. 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 Oncotype score 17: Originally diagnosed in 2008. Patient completed 5 years of Arimidex in approximately August 2013. Biopsy of the right breast only revealed fat necrosis at the site of her previous MammoSite.  Mammogram and possible biopsy as above. 4. Genetic testing: Patient has a sister and mother both with breast cancer. 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 5.  Back pain: Patient does not complain of this today.  Patient reports she is no longer  taking Flexeril. 6.  Flank pain: Patient does not complain of this today.  Secondary to splenomegaly.  Imbruvica as above.  Continue Vicodin sparingly.   7.  Anxiety: Chronic and unchanged.  Continue Xanax as needed.  I spent a total of 30 minutes reviewing chart data, face-to-face evaluation with the patient, counseling and coordination of care as detailed above.    Patient expressed understanding and was in agreement with this plan. She also understands that She can call clinic at any time with any questions, concerns, or complaints.   Cancer Staging Carcinoma of upper-outer quadrant of left breast in female, estrogen receptor negative ([place]) Staging form: Breast, AJCC 8th Edition - Clinical stage from 04/08/2017: Stage IB (cT1c, cN0, cM0, G3, ER: Negative, PR: Negative, HER2: Negative) - Signed by Lloyd Huger, MD on 04/08/2017 Histologic grading  system: 3 grade system Laterality: Right - Pathologic stage from 05/11/2017: Stage IB (pT1c, pN0, cM0, G3, ER: Negative, PR: Negative, HER2: Negative) - Signed by Lloyd Huger, MD on 05/11/2017 Neoadjuvant therapy: No Histologic grading system: 3 grade system Laterality: Left   Lloyd Huger, MD   02/20/2021 6:15 AM

## 2021-02-19 ENCOUNTER — Inpatient Hospital Stay: Payer: Medicare HMO | Admitting: Pharmacist

## 2021-02-19 ENCOUNTER — Other Ambulatory Visit: Payer: Self-pay

## 2021-02-19 ENCOUNTER — Inpatient Hospital Stay: Payer: Medicare HMO | Attending: Oncology | Admitting: Oncology

## 2021-02-19 ENCOUNTER — Encounter: Payer: Self-pay | Admitting: Oncology

## 2021-02-19 ENCOUNTER — Inpatient Hospital Stay: Payer: Medicare HMO

## 2021-02-19 VITALS — BP 128/73 | HR 71 | Temp 99.0°F | Resp 18 | Wt 157.5 lb

## 2021-02-19 DIAGNOSIS — Z8249 Family history of ischemic heart disease and other diseases of the circulatory system: Secondary | ICD-10-CM | POA: Insufficient documentation

## 2021-02-19 DIAGNOSIS — C9110 Chronic lymphocytic leukemia of B-cell type not having achieved remission: Secondary | ICD-10-CM

## 2021-02-19 DIAGNOSIS — Z9221 Personal history of antineoplastic chemotherapy: Secondary | ICD-10-CM | POA: Diagnosis not present

## 2021-02-19 DIAGNOSIS — Z853 Personal history of malignant neoplasm of breast: Secondary | ICD-10-CM | POA: Diagnosis not present

## 2021-02-19 DIAGNOSIS — Z803 Family history of malignant neoplasm of breast: Secondary | ICD-10-CM | POA: Insufficient documentation

## 2021-02-19 DIAGNOSIS — F419 Anxiety disorder, unspecified: Secondary | ICD-10-CM | POA: Insufficient documentation

## 2021-02-19 DIAGNOSIS — Z801 Family history of malignant neoplasm of trachea, bronchus and lung: Secondary | ICD-10-CM | POA: Insufficient documentation

## 2021-02-19 DIAGNOSIS — Z923 Personal history of irradiation: Secondary | ICD-10-CM | POA: Diagnosis not present

## 2021-02-19 DIAGNOSIS — D0512 Intraductal carcinoma in situ of left breast: Secondary | ICD-10-CM | POA: Diagnosis present

## 2021-02-19 DIAGNOSIS — Z87891 Personal history of nicotine dependence: Secondary | ICD-10-CM | POA: Insufficient documentation

## 2021-02-19 LAB — COMPREHENSIVE METABOLIC PANEL
ALT: 26 U/L (ref 0–44)
AST: 24 U/L (ref 15–41)
Albumin: 3.6 g/dL (ref 3.5–5.0)
Alkaline Phosphatase: 67 U/L (ref 38–126)
Anion gap: 11 (ref 5–15)
BUN: 16 mg/dL (ref 8–23)
CO2: 23 mmol/L (ref 22–32)
Calcium: 9 mg/dL (ref 8.9–10.3)
Chloride: 106 mmol/L (ref 98–111)
Creatinine, Ser: 0.9 mg/dL (ref 0.44–1.00)
GFR, Estimated: >60 mL/min (ref >60)
Glucose, Bld: 95 mg/dL (ref 70–99)
Potassium: 4.2 mmol/L (ref 3.5–5.1)
Sodium: 140 mmol/L (ref 135–145)
Total Bilirubin: 0.8 mg/dL (ref 0.3–1.2)
Total Protein: 6.9 g/dL (ref 6.5–8.1)

## 2021-02-19 LAB — CBC WITH DIFFERENTIAL/PLATELET
Abs Immature Granulocytes: 0.18 10*3/uL — ABNORMAL HIGH (ref 0.00–0.07)
Basophils Absolute: 0.1 10*3/uL (ref 0.0–0.1)
Basophils Relative: 1 %
Eosinophils Absolute: 0.1 10*3/uL (ref 0.0–0.5)
Eosinophils Relative: 1 %
HCT: 32.8 % — ABNORMAL LOW (ref 36.0–46.0)
Hemoglobin: 10.7 g/dL — ABNORMAL LOW (ref 12.0–15.0)
Immature Granulocytes: 2 %
Lymphocytes Relative: 40 %
Lymphs Abs: 4.7 10*3/uL — ABNORMAL HIGH (ref 0.7–4.0)
MCH: 27.9 pg (ref 26.0–34.0)
MCHC: 32.6 g/dL (ref 30.0–36.0)
MCV: 85.4 fL (ref 80.0–100.0)
Monocytes Absolute: 0.8 10*3/uL (ref 0.1–1.0)
Monocytes Relative: 7 %
Neutro Abs: 5.7 10*3/uL (ref 1.7–7.7)
Neutrophils Relative %: 49 %
Platelets: 156 10*3/uL (ref 150–400)
RBC: 3.84 MIL/uL — ABNORMAL LOW (ref 3.87–5.11)
RDW: 14 % (ref 11.5–15.5)
WBC: 11.5 10*3/uL — ABNORMAL HIGH (ref 4.0–10.5)
nRBC: 0 % (ref 0.0–0.2)

## 2021-02-19 LAB — MAGNESIUM: Magnesium: 2 mg/dL (ref 1.7–2.4)

## 2021-02-19 LAB — PHOSPHORUS: Phosphorus: 3.6 mg/dL (ref 2.5–4.6)

## 2021-02-19 MED ORDER — IMBRUVICA 420 MG PO TABS: 420.0000 mg | ORAL_TABLET | Freq: Every day | ORAL | 1 refills | Status: DC

## 2021-02-19 NOTE — Progress Notes (Signed)
[place]  Telephone:([phone] [phone]  Patient Care Team: Lynnell Jude, MD as PCP - General (Family Medicine) Rico Junker, RN as Oncology Nurse Navigator Grayland Ormond, Kathlene November, MD as Consulting Physician (Oncology) Clemmie Krill Lynnell Jude, MD as Referring Physician (Family Medicine) Bary Castilla Forest Gleason, MD (General Surgery) Noreene Filbert, MD as Referring Physician (Radiation Oncology)   Name of the patient: Megan Santana  [id number]  1934-05-09   Date of visit: 02/19/21  HPI: Patient is a 87 y.o. female with CLL, 13q deletion. She was originally treated with rituximab, but had a reaction to her first infusion. She was then switched to treatment with Imbruvica (ibrutinib). Ibrutinib started on 08/28/20. Patient's daughter is present at the visit today.  Reason for Consult: Oral chemotherapy follow-up for ibrutinib therapy.   PAST MEDICAL HISTORY: Past Medical History:  Diagnosis Date  . Arthritis   . Breast cancer [facility]) 2007   right lumpectomy  . Breast cancer ([place]) 03/31/2017   16 mm invasive mammary carcinoma, left upper outer quadrant, T1c, N0, triple negative, histologic grade 3.  . Family history of adverse reaction to anesthesia    mom n/v  . GERD (gastroesophageal reflux disease)   . Heart murmur   . Lumbar radiculitis   . Lumbar stenosis   . Personal history of chemotherapy 2018   Left  . Personal history of radiation therapy 2007   Right  . Personal history of radiation therapy 2018   Left    PAST SURGICAL HISTORY:  Past Surgical History:  Procedure Laterality Date  . ABDOMINAL HYSTERECTOMY     total  . BACK SURGERY     lumbar  . BREAST BIOPSY Bilateral 03/31/2017   bilat u/s core INVASIVE MAMMARY CARCINOMA  . BREAST BIOPSY Right 03/31/2017   DENSE FIBROSIS AND SHEETS OF MACROPHAGES  . BREAST EXCISIONAL BIOPSY Left 2018  . BREAST LUMPECTOMY Right 2007  . BREAST LUMPECTOMY Left 04/23/2017   . BREAST LUMPECTOMY Left 04/23/2017   Procedure: BREAST LUMPECTOMY WITH EXCISION OF SENTINEL NODE;  Surgeon: Robert Bellow, MD;  Whole breast radiation ORS;  Service: General;  Laterality: Left;  . BREAST SURGERY Right    Wide excision  . COLONOSCOPY    . EYE SURGERY     cataracts bil  . FRACTURE SURGERY Left 2015 or 2016  . TUMOR EXCISION  2001    HEMATOLOGY/ONCOLOGY HISTORY:  Oncology History  CLL (chronic lymphocytic leukemia) ([place])  02/10/2020 Initial Diagnosis   CLL (chronic lymphocytic leukemia) ([place])   08/17/2020 - 08/17/2020 Chemotherapy   The patient had riTUXimab-pvvr (RUXIENCE) 700 mg in sodium chloride 0.9 % 250 mL (2.1875 mg/mL) infusion, 375 mg/m2 = 700 mg, Intravenous,  Once, 1 of 4 cycles Administration: 700 mg (08/17/2020)  for chemotherapy treatment.      ALLERGIES:  is allergic to oxycodone.  MEDICATIONS:  Current Outpatient Medications  Medication Sig Dispense Refill  . acetaminophen (TYLENOL) 500 MG tablet Take 1,000 mg by mouth every 6 (six) hours as needed (for pain.).    Marland Kitchen calcium carbonate (TUMS EX) 750 MG chewable tablet Chew 1-2 tablets by mouth 3 (three) times daily as needed (for heartburn/indigestion).    . ibrutinib (IMBRUVICA) 420 MG TABS Take 420 mg by mouth daily. 90 tablet 1  . Magnesium Oxide 250 MG TABS Take 250 mg by mouth daily.     . Multiple Minerals-Vitamins (CAL MAG ZINC +D3 PO) Take 1 tablet by mouth daily.    Marland Kitchen  Multiple Vitamins-Minerals (CENTRUM SILVER 50+WOMEN) TABS Take 1 tablet by mouth daily.    . nitrofurantoin, macrocrystal-monohydrate, (MACROBID) 100 MG capsule Take 100 mg by mouth 2 (two) times daily.    . Potassium 99 MG TABS Take 99 mg by mouth daily.      No current facility-administered medications for this visit.    VITAL SIGNS: There were no vitals taken for this visit. There were no vitals filed for this visit.  Estimated body mass index is 28.81 kg/m as calculated from the following:   Height as of 09/15/20: 5'  2" (1.575 m).   Weight as of an earlier encounter on 02/19/21: 71.4 kg (157 lb 8 oz).  LABS: CBC:    Component Value Date/Time   WBC 11.5 (H) 02/19/2021 1040   HGB 10.7 (L) 02/19/2021 1040   HCT 32.8 (L) 02/19/2021 1040   PLT 156 02/19/2021 1040   MCV 85.4 02/19/2021 1040   NEUTROABS 5.7 02/19/2021 1040   LYMPHSABS 4.7 (H) 02/19/2021 1040   MONOABS 0.8 02/19/2021 1040   EOSABS 0.1 02/19/2021 1040   BASOSABS 0.1 02/19/2021 1040   Comprehensive Metabolic Panel:    Component Value Date/Time   NA 140 02/19/2021 1040   K 4.2 02/19/2021 1040   CL 106 02/19/2021 1040   CO2 23 02/19/2021 1040   BUN 16 02/19/2021 1040   CREATININE 0.90 02/19/2021 1040   GLUCOSE 95 02/19/2021 1040   CALCIUM 9.0 02/19/2021 1040   AST 24 02/19/2021 1040   ALT 26 02/19/2021 1040   ALKPHOS 67 02/19/2021 1040   BILITOT 0.8 02/19/2021 1040   PROT 6.9 02/19/2021 1040   ALBUMIN 3.6 02/19/2021 1040    RADIOGRAPHIC STUDIES: DG Lumbar Spine Complete  Result Date: 01/22/2021 CLINICAL DATA:  Lumbar spine pain EXAM: LUMBAR SPINE - COMPLETE 4+ VIEW COMPARISON:  None. FINDINGS: 5 nonrib bearing lumbar-type vertebral bodies. Mild T11 vertebral body compression fracture. Remainder of the vertebral body heights are maintained. Dextroscoliosis of the thoracolumbar spine. Minimal grade 1 anterolisthesis of L3 on L4. No spondylolysis. Degenerative disease with disc height loss throughout the thoracic spine with facet arthropathy. SI joints are unremarkable. Abdominal aortic atherosclerosis. IMPRESSION: Age-indeterminate mild T11 vertebral body compression fracture. Electronically Signed   By: Kathreen Devoid   On: 01/22/2021 16:23   CT CHEST ABDOMEN PELVIS W CONTRAST  Result Date: 02/04/2021 CLINICAL DATA:  Follow-up chronic lymphocytic leukemia. History of bilateral breast cancer. EXAM: CT CHEST, ABDOMEN, AND PELVIS WITH CONTRAST TECHNIQUE: Multidetector CT imaging of the chest, abdomen and pelvis was performed following the  standard protocol during bolus administration of intravenous contrast. CONTRAST:  141mL OMNIPAQUE IOHEXOL 300 MG/ML  SOLN COMPARISON:  Breast imaging studies in November and December of 2021. CTs of the chest, abdomen and pelvis 08/13/2020. FINDINGS: CT CHEST FINDINGS Cardiovascular: Fairly extensive atherosclerosis of the aorta, great vessels and coronary arteries again noted. No acute vascular findings are identified. There are calcifications of the aortic valve. The heart size is normal. There is no pericardial effusion. Mediastinum/Nodes: The previously demonstrated extensive mediastinal and axillary adenopathy has resolved. There are no residual enlarged mediastinal, hilar, axillary or internal mammary lymph nodes. The largest remaining nodes include a high right paraesophageal lobe measuring 7 mm on image 12/2 (previously 16 mm) and a right paratracheal node measuring 10 mm on image 20/2 (previously 3.2 cm). There is a stable small hiatal hernia. The thyroid gland and trachea appear normal. Lungs/Pleura: No pleural effusion or pneumothorax. Interval improved aeration of the lungs with  resolution of the previously demonstrated linear opacities at both lung bases. No airspace disease or suspicious pulmonary nodule. Mild biapical scarring noted. Musculoskeletal/Chest wall: An area of probable fat necrosis centrally in the left breast measuring up to 2.8 cm on image 27/2 is stable. Chronic fluid collection laterally in the right breast with peripheral calcifications is unchanged, measuring 3.7 x 3.3 cm on image 32/2. No enlarging breast masses or suspicious chest wall lesions. No evidence of osseous metastatic disease. CT ABDOMEN AND PELVIS FINDINGS Hepatobiliary: The liver is normal in density without suspicious focal abnormality. No evidence of gallstones, gallbladder wall thickening or biliary dilatation. Pancreas: Unremarkable. No pancreatic ductal dilatation or surrounding inflammatory changes. Spleen:  Previously demonstrated splenomegaly has resolved. The spleen is now normal in size without focal abnormality. Splenic height is 10.5 cm (previously 17.4 cm). Adrenals/Urinary Tract: Both adrenal glands appear normal. Stable small renal cortical and sinus cysts bilaterally. No evidence of renal mass, urinary tract calculus or hydronephrosis. Findings of pelvic floor laxity with a small cystocele. Stomach/Bowel: The stomach appears unremarkable for its degree of distension. No evidence of bowel wall thickening, distention or surrounding inflammatory change. Vascular/Lymphatic: The previously identified enlarged abdominopelvic lymph nodes have all resolved. There is a small portacaval node measuring 9 mm on image 63/2 (previously 18 mm). No residual enlarged retroperitoneal, mesenteric or pelvic lymph nodes. Aortic and branch vessel atherosclerosis without acute vascular findings. The portal, superior mesenteric and splenic veins are patent. Reproductive: Hysterectomy. No adnexal mass. As above, probable pelvic floor laxity with a cystocele and possible rectocele. Other: Small periumbilical hernia, currently containing only fat. There is no residual herniation of the colon into this hernia. No ascites or peritoneal nodularity. Musculoskeletal: No evidence of osseous metastatic disease. There is a thoracolumbar scoliosis with associated multilevel spondylosis. Interval development of a mild superior endplate compression fracture at T11, resulting in less than 20% loss of vertebral body height. This demonstrates no pathologic features. Moderate left-sided foraminal narrowing is present at L3-4. IMPRESSION: 1. Interval resolution of previously demonstrated extensive adenopathy throughout the chest, abdomen and pelvis, consistent with response to therapy. No residual adenopathy. 2. Similar resolution of splenomegaly, consistent with treated chronic lymphocytic leukemia. 3. No specific findings of recurrent or metastatic  breast cancer. Postsurgical changes in both breasts are stable by CT. 4. Interval development of a mild superior endplate compression fracture at T11 without pathologic features. 5. Findings of pelvic floor laxity with a cystocele and possible rectocele. 6. Aortic Atherosclerosis (ICD10-I70.0). Electronically Signed   By: Richardean Sale M.D.   On: 02/04/2021 14:55     Assessment and Plan-  ALC decreased/close to wnl and LFTs wnl. Plan to continue ibrutinib 420mg  daily. Patient has an upcoming surgery, she will hold her ibrutinib for 1 week prior and at least 1 week after.   Oral Chemotherapy Side Effect/Intolerance:  -Bowel movements: patient previously had issues with constipation and diarrhea. At today's visit she reported her bowel movement have been normal but during the time period she has COVID she did have trouble with diarrhea.  Oral Chemotherapy Adherence: no missed doses reported  Medication Access Issues: no issues, patient received a refill from [facility] a little over a week ago  Other: patient recently has COVID, she reported first testing positive on 02/05/21. She is feeling overall better now.  No patient barriers to medication adherence identified.   Patient expressed understanding and was in agreement with this plan. She also understands that She can call clinic at any  time with any questions, concerns, or complaints.   Thank you for allowing me to participate in the care of this very pleasant patient.   Time Total: 41mins  Visit consisted of counseling and education on dealing with issues of symptom management in the setting of serious and potentially life-threatening illness.Greater than 50%  of this time was spent counseling and coordinating care related to the above assessment and plan.  Signed by: Darl Pikes, PharmD, BCPS, Salley Slaughter, CPP Hematology/Oncology Clinical Pharmacist Practitioner [facility] [phone]  02/19/2021 3:44 PM

## 2021-02-21 ENCOUNTER — Other Ambulatory Visit
Admission: RE | Admit: 2021-02-21 | Discharge: 2021-02-21 | Disposition: A | Discharge status: Home / Self Care | Payer: Medicare HMO | Source: Ambulatory Visit | Attending: General Surgery | Admitting: General Surgery

## 2021-02-21 ENCOUNTER — Other Ambulatory Visit: Payer: Self-pay

## 2021-02-21 NOTE — Pre-Procedure Instructions (Signed)
Patient tested positive for covid per the health department on 02/05/21. She provided this documentation to her surgeon and does not require being retested at this time.

## 2021-02-21 NOTE — Pre-Procedure Instructions (Deleted)
Patient arrived for covid testing today.  She states that she was positive on 02/05/21. The health department submitted documents to her surgeon with proof of positivity.  She had a negative home test after this positive test.  Policy for covid testing has been that if there is a negative test after a positive test, that we need to retest. Patient is now refusing to be retested as she was told by MD not to test as it could be positive and would delay her surgery.

## 2021-02-24 HISTORY — PX: MASTECTOMY: SHX3

## 2021-02-25 ENCOUNTER — Encounter
Admission: RE | Admit: 2021-02-25 | Discharge: 2021-02-25 | Disposition: A | Discharge status: Home / Self Care | Payer: Medicare HMO | Source: Ambulatory Visit | Attending: General Surgery | Admitting: General Surgery

## 2021-02-25 ENCOUNTER — Ambulatory Visit: Payer: Medicare HMO

## 2021-02-25 ENCOUNTER — Encounter: Payer: Self-pay | Admitting: General Surgery

## 2021-02-25 ENCOUNTER — Encounter: Admission: RE | Payer: Self-pay | Source: Home / Self Care

## 2021-02-25 ENCOUNTER — Encounter
Admission: RE | Disposition: A | Discharge status: Home / Self Care | Payer: Self-pay | Source: Home / Self Care | Attending: General Surgery

## 2021-02-25 ENCOUNTER — Ambulatory Visit
Admission: RE | Admit: 2021-02-25 | Discharge: 2021-02-25 | Disposition: A | Discharge status: Home / Self Care | Payer: Medicare HMO | Attending: General Surgery | Admitting: General Surgery

## 2021-02-25 DIAGNOSIS — Z853 Personal history of malignant neoplasm of breast: Secondary | ICD-10-CM | POA: Diagnosis not present

## 2021-02-25 DIAGNOSIS — Z8616 Personal history of COVID-19: Secondary | ICD-10-CM | POA: Diagnosis not present

## 2021-02-25 DIAGNOSIS — D0512 Intraductal carcinoma in situ of left breast: Secondary | ICD-10-CM | POA: Diagnosis present

## 2021-02-25 DIAGNOSIS — Z79899 Other long term (current) drug therapy: Secondary | ICD-10-CM | POA: Insufficient documentation

## 2021-02-25 DIAGNOSIS — Z923 Personal history of irradiation: Secondary | ICD-10-CM | POA: Diagnosis not present

## 2021-02-25 DIAGNOSIS — Z9221 Personal history of antineoplastic chemotherapy: Secondary | ICD-10-CM | POA: Insufficient documentation

## 2021-02-25 DIAGNOSIS — Z87891 Personal history of nicotine dependence: Secondary | ICD-10-CM | POA: Diagnosis not present

## 2021-02-25 HISTORY — PX: SIMPLE MASTECTOMY WITH AXILLARY SENTINEL NODE BIOPSY: SHX6098

## 2021-02-25 LAB — SARS CORONAVIRUS 2 BY RT PCR (HOSPITAL ORDER, PERFORMED IN ~~LOC~~ HOSPITAL LAB): SARS Coronavirus 2: POSITIVE — AB

## 2021-02-25 SURGERY — SIMPLE MASTECTOMY WITH AXILLARY SENTINEL NODE BIOPSY
Anesthesia: General | Site: Breast | Laterality: Left

## 2021-02-25 SURGERY — SIMPLE MASTECTOMY WITH AXILLARY SENTINEL NODE BIOPSY
Anesthesia: General | Laterality: Left

## 2021-02-25 MED ORDER — FENTANYL CITRATE (PF) 100 MCG/2ML IJ SOLN
INTRAMUSCULAR | Status: AC
  Filled 2021-02-25: qty 2

## 2021-02-25 MED ORDER — CHLORHEXIDINE GLUCONATE CLOTH 2 % EX PADS: 6.0000 | MEDICATED_PAD | Freq: Once | CUTANEOUS | Status: DC

## 2021-02-25 MED ORDER — FENTANYL CITRATE (PF) 100 MCG/2ML IJ SOLN
25.0000 ug | INTRAMUSCULAR | Status: DC | PRN
  Administered 2021-02-25 (×2): 25 ug via INTRAVENOUS

## 2021-02-25 MED ORDER — BUPIVACAINE-EPINEPHRINE (PF) 0.5% -1:200000 IJ SOLN
INTRAMUSCULAR | Status: AC
  Filled 2021-02-25: qty 30

## 2021-02-25 MED ORDER — METHYLENE BLUE 0.5 % INJ SOLN
INTRAVENOUS | Status: DC | PRN
  Administered 2021-02-25: 5 mL via SUBMUCOSAL

## 2021-02-25 MED ORDER — ACETAMINOPHEN 10 MG/ML IV SOLN
INTRAVENOUS | Status: AC
  Filled 2021-02-25: qty 100

## 2021-02-25 MED ORDER — FAMOTIDINE 20 MG PO TABS: 20.0000 mg | ORAL_TABLET | Freq: Once | ORAL | Status: DC

## 2021-02-25 MED ORDER — ONDANSETRON HCL 4 MG/2ML IJ SOLN
INTRAMUSCULAR | Status: DC | PRN
  Administered 2021-02-25: 4 mg via INTRAVENOUS

## 2021-02-25 MED ORDER — FENTANYL CITRATE (PF) 100 MCG/2ML IJ SOLN
INTRAMUSCULAR | Status: AC
  Administered 2021-02-25: 25 ug via INTRAVENOUS
  Filled 2021-02-25: qty 2

## 2021-02-25 MED ORDER — ORAL CARE MOUTH RINSE: 15.0000 mL | Freq: Once | OROMUCOSAL | Status: AC

## 2021-02-25 MED ORDER — DEXAMETHASONE SODIUM PHOSPHATE 10 MG/ML IJ SOLN
INTRAMUSCULAR | Status: DC | PRN
  Administered 2021-02-25: 10 mg via INTRAVENOUS

## 2021-02-25 MED ORDER — LIDOCAINE HCL (CARDIAC) PF 100 MG/5ML IV SOSY
PREFILLED_SYRINGE | INTRAVENOUS | Status: DC | PRN
  Administered 2021-02-25: 80 mg via INTRAVENOUS

## 2021-02-25 MED ORDER — PROPOFOL 10 MG/ML IV BOLUS
INTRAVENOUS | Status: AC
  Filled 2021-02-25: qty 20

## 2021-02-25 MED ORDER — CEFAZOLIN SODIUM-DEXTROSE 2-4 GM/100ML-% IV SOLN
INTRAVENOUS | Status: AC
  Filled 2021-02-25: qty 100

## 2021-02-25 MED ORDER — MEPERIDINE HCL 50 MG/ML IJ SOLN: 6.2500 mg | INTRAMUSCULAR | Status: DC | PRN

## 2021-02-25 MED ORDER — TECHNETIUM TC 99M TILMANOCEPT KIT
1.0000 | PACK | Freq: Once | INTRAVENOUS | Status: AC | PRN
  Administered 2021-02-25: 1.03 via INTRADERMAL

## 2021-02-25 MED ORDER — KETOROLAC TROMETHAMINE 30 MG/ML IJ SOLN
INTRAMUSCULAR | Status: AC
  Filled 2021-02-25: qty 1

## 2021-02-25 MED ORDER — DEXMEDETOMIDINE (PRECEDEX) IN NS 20 MCG/5ML (4 MCG/ML) IV SYRINGE
PREFILLED_SYRINGE | INTRAVENOUS | Status: DC | PRN
  Administered 2021-02-25: 8 ug via INTRAVENOUS

## 2021-02-25 MED ORDER — PROMETHAZINE HCL 25 MG/ML IJ SOLN: 6.2500 mg | INTRAMUSCULAR | Status: DC | PRN

## 2021-02-25 MED ORDER — LACTATED RINGERS IV SOLN: INTRAVENOUS | Status: DC

## 2021-02-25 MED ORDER — CHLORHEXIDINE GLUCONATE 0.12 % MT SOLN
OROMUCOSAL | Status: AC
  Filled 2021-02-25: qty 15

## 2021-02-25 MED ORDER — KETOROLAC TROMETHAMINE 30 MG/ML IJ SOLN
INTRAMUSCULAR | Status: DC | PRN
  Administered 2021-02-25: 15 mg via INTRAVENOUS

## 2021-02-25 MED ORDER — TRAMADOL HCL 50 MG PO TABS: 50.0000 mg | ORAL_TABLET | ORAL | 0 refills | Status: DC | PRN

## 2021-02-25 MED ORDER — METHYLENE BLUE 0.5 % INJ SOLN
INTRAVENOUS | Status: AC
  Filled 2021-02-25: qty 10

## 2021-02-25 MED ORDER — CHLORHEXIDINE GLUCONATE 0.12 % MT SOLN
15.0000 mL | Freq: Once | OROMUCOSAL | Status: AC
  Administered 2021-02-25: 15 mL via OROMUCOSAL

## 2021-02-25 MED ORDER — EPHEDRINE SULFATE 50 MG/ML IJ SOLN
INTRAMUSCULAR | Status: DC | PRN
  Administered 2021-02-25: 10 mg via INTRAVENOUS

## 2021-02-25 MED ORDER — PROPOFOL 10 MG/ML IV BOLUS
INTRAVENOUS | Status: DC | PRN
  Administered 2021-02-25: 20 mg via INTRAVENOUS
  Administered 2021-02-25: 120 mg via INTRAVENOUS
  Administered 2021-02-25: 20 mg via INTRAVENOUS
  Administered 2021-02-25: 10 mg via INTRAVENOUS

## 2021-02-25 MED ORDER — FENTANYL CITRATE (PF) 100 MCG/2ML IJ SOLN
INTRAMUSCULAR | Status: DC | PRN
  Administered 2021-02-25 (×5): 25 ug via INTRAVENOUS

## 2021-02-25 MED ORDER — ACETAMINOPHEN 10 MG/ML IV SOLN
INTRAVENOUS | Status: DC | PRN
  Administered 2021-02-25: 1000 mg via INTRAVENOUS

## 2021-02-25 MED ORDER — CEFAZOLIN SODIUM-DEXTROSE 2-4 GM/100ML-% IV SOLN
2.0000 g | INTRAVENOUS | Status: AC
  Administered 2021-02-25: 2 g via INTRAVENOUS

## 2021-02-25 SURGICAL SUPPLY — 55 items
APL PRP STRL LF DISP 70% ISPRP (MISCELLANEOUS) ×1
APPLIER CLIP 11 MED OPEN (CLIP)
APPLIER CLIP 13 LRG OPEN (CLIP)
APR CLP LRG 13 20 CLIP (CLIP)
APR CLP MED 11 20 MLT OPN (CLIP)
BLADE PHOTON ILLUMINATED (MISCELLANEOUS) ×2 IMPLANT
BLADE SURG 15 STRL SS SAFETY (BLADE) ×2 IMPLANT
BULB RESERV EVAC DRAIN JP 100C (MISCELLANEOUS) ×2 IMPLANT
CANISTER SUCT 1200ML W/VALVE (MISCELLANEOUS) IMPLANT
CHLORAPREP W/TINT 26 (MISCELLANEOUS) ×2 IMPLANT
CLIP APPLIE 11 MED OPEN (CLIP) IMPLANT
CLIP APPLIE 13 LRG OPEN (CLIP) IMPLANT
CNTNR SPEC 2.5X3XGRAD LEK (MISCELLANEOUS) ×3
CONT SPEC 4OZ STER OR WHT (MISCELLANEOUS) ×3
CONT SPEC 4OZ STRL OR WHT (MISCELLANEOUS) ×3
CONTAINER SPEC 2.5X3XGRAD LEK (MISCELLANEOUS) ×3 IMPLANT
COVER WAND RF STERILE (DRAPES) IMPLANT
DRAIN CHANNEL JP 15F RND 16 (MISCELLANEOUS) ×2 IMPLANT
DRAPE LAPAROTOMY TRNSV 106X77 (MISCELLANEOUS) ×2 IMPLANT
DRSG GAUZE FLUFF 36X18 (GAUZE/BANDAGES/DRESSINGS) ×2 IMPLANT
DRSG TELFA 3X8 NADH (GAUZE/BANDAGES/DRESSINGS) ×2 IMPLANT
DRSG TELFA 4X3 1S NADH ST (GAUZE/BANDAGES/DRESSINGS) ×4 IMPLANT
ELECT CAUTERY BLADE TIP 2.5 (TIP) ×2
ELECT REM PT RETURN 9FT ADLT (ELECTROSURGICAL) ×2
ELECTRODE CAUTERY BLDE TIP 2.5 (TIP) ×1 IMPLANT
ELECTRODE REM PT RTRN 9FT ADLT (ELECTROSURGICAL) ×1 IMPLANT
GLOVE INDICATOR 8.0 STRL GRN (GLOVE) ×2 IMPLANT
GLOVE SURG ENC MOIS LTX SZ7.5 (GLOVE) ×2 IMPLANT
GOWN STRL REUS W/ TWL LRG LVL3 (GOWN DISPOSABLE) ×2 IMPLANT
GOWN STRL REUS W/TWL LRG LVL3 (GOWN DISPOSABLE) ×4
LABEL OR SOLS (LABEL) ×2 IMPLANT
MANIFOLD NEPTUNE II (INSTRUMENTS) ×2 IMPLANT
PACK BASIN MINOR ARMC (MISCELLANEOUS) ×2 IMPLANT
PIN SAFETY STRL (MISCELLANEOUS) ×2 IMPLANT
RETRACTOR RING XSMALL (MISCELLANEOUS) ×1 IMPLANT
RTRCTR WOUND ALEXIS 13CM XS SH (MISCELLANEOUS) ×2
SHEARS FOC LG CVD HARMONIC 17C (MISCELLANEOUS) IMPLANT
SLEVE PROBE SENORX GAMMA FIND (MISCELLANEOUS) ×2 IMPLANT
SPONGE LAP 18X18 RF (DISPOSABLE) ×2 IMPLANT
STRIP CLOSURE SKIN 1/2X4 (GAUZE/BANDAGES/DRESSINGS) ×4 IMPLANT
SUT ETHILON 3-0 FS-10 30 BLK (SUTURE) ×2
SUT SILK 2 0 (SUTURE) ×4
SUT SILK 2-0 18XBRD TIE 12 (SUTURE) ×1 IMPLANT
SUT SILK 2-0 30XBRD TIE 12 (SUTURE) ×1 IMPLANT
SUT SILK 3 0 (SUTURE) ×2
SUT SILK 3-0 (SUTURE) ×2 IMPLANT
SUT SILK 3-0 18XBRD TIE 12 (SUTURE) ×1 IMPLANT
SUT VIC AB 2-0 CT1 27 (SUTURE) ×8
SUT VIC AB 2-0 CT1 TAPERPNT 27 (SUTURE) ×4 IMPLANT
SUT VIC AB 3-0 SH 27 (SUTURE) ×2
SUT VIC AB 3-0 SH 27X BRD (SUTURE) ×1 IMPLANT
SUT VICRYL+ 3-0 144IN (SUTURE) ×2 IMPLANT
SUTURE EHLN 3-0 FS-10 30 BLK (SUTURE) ×1 IMPLANT
SWABSTK COMLB BENZOIN TINCTURE (MISCELLANEOUS) ×2 IMPLANT
TAPE TRANSPORE STRL 2 31045 (GAUZE/BANDAGES/DRESSINGS) ×2 IMPLANT

## 2021-02-25 NOTE — Op Note (Signed)
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  Postoperative diagnosis: Same.  Operative procedure: Left simple mastectomy with sentinel node biopsy.  Operating surgeon: Hervey Ard, MD  Anesthesia: General by LMA.  Estimated blood loss: Less than 30 cc.  Clinical note: This 86 year old woman was treated for invasive mammary carcinoma involving the upper outer quadrant of the left breast for almost 4 years ago.  She received MammoSite radiation.  She is developed a new foci of DCIS in the lower outer quadrant of the left breast.  Given her options for repeat wide excision or mastectomy she has chosen the latter.  She received antibiotics prior to the procedure.  SCD stockings for DVT prevention.  Operative note: With the patient under adequate general anesthesia the breast was cleansed with alcohol and 5 cc of 0.5% methylene blue was injected in the subareolar plexus.  She had already undergone injection with Lymphoseek by the radiology department.  The breast, chest and axilla was then cleansed with ChloraPrep and draped.  An elliptical incision was outlined on the breast.  Flaps were then elevated to the clavicle superiorly, sternum medially, inframammary fold inferiorly and the serratus muscle laterally.  Intercostal vessels were controlled with 3-0 Vicryl ties.  Breast was dissected from the adjacent tissue with the photon blade.  Scant bleeding was noted.  There was a 1/2 cm diameter area where the flap was very thin in the lower outer quadrant.  This was inked after the procedure for identification from the skin.  Scanning through the axilla with the node seeker there were 2 areas of increased uptake with rubbery tissue thought consistent with lymph node tissue.  Once this was removed the counts fell.  Rescanning through the axilla was unremarkable.  No blue stained nodes were appreciated.  The breast was handcarried to pathology at the end of the procedure to review the anatomy,  primarily where at the original wide excision site there had been scarring and it was necessary to excise additional tissue to thin the flap.  There was noted to be focal necrosis with a small amount of fat necrosis at the prior MammoSite site.  Good hemostasis was noted.  The wound was irrigated with sterile water.  A Blake drain was brought out through the lower inferior flap and anchored in position with 3-0 nylon.  The skin skin flap was closed with a running 2-0 Vicryl suture in 2 segments.  This was completed at the deep dermal level.  Benzoin Steri-Strips were applied followed by fluff gauze and a compressive wrap.  The patient tolerated the procedure well and was taken recovery in stable condition.

## 2021-02-25 NOTE — Transfer of Care (Signed)
Immediate Anesthesia Transfer of Care Note  Patient: Megan Santana  Procedure(s) Performed: SIMPLE MASTECTOMY WITH AXILLARY SENTINEL NODE BIOPSY (Left Breast)  Patient Location: PACU  Anesthesia Type:General  Level of Consciousness: drowsy  Airway & Oxygen Therapy: Patient Spontanous Breathing and Patient connected to face mask oxygen  Post-op Assessment: Report given to RN and Post -op Vital signs reviewed and stable  Post vital signs: Reviewed and stable  Last Vitals:  Vitals Value Taken Time  BP 108/44 02/25/21 1502  Temp    Pulse 65 02/25/21 1506  Resp 25 02/25/21 1506  SpO2 96 % 02/25/21 1506  Vitals shown include unvalidated device data.  Last Pain:  Vitals:   02/25/21 0957  TempSrc: Temporal  PainSc: 0-No pain         Complications: No complications documented.

## 2021-02-25 NOTE — Anesthesia Preprocedure Evaluation (Signed)
Anesthesia Evaluation  Patient identified by MRN, date of birth, ID band Patient awake    Reviewed: Allergy & Precautions, NPO status , Patient's Chart, lab work & pertinent test results  History of Anesthesia Complications Negative for: history of anesthetic complications  Airway Mallampati: II  TM Distance: >3 FB Neck ROM: Full    Dental no notable dental hx.    Pulmonary neg sleep apnea, neg COPD, former smoker,    breath sounds clear to auscultation- rhonchi (-) wheezing      Cardiovascular Exercise Tolerance: Good (-) hypertension(-) CAD, (-) Past MI, (-) Cardiac Stents and (-) CABG  Rhythm:Regular Rate:Normal - Systolic murmurs and - Diastolic murmurs    Neuro/Psych neg Seizures negative neurological ROS  negative psych ROS   GI/Hepatic Neg liver ROS, GERD  ,  Endo/Other  negative endocrine ROSneg diabetes  Renal/GU negative Renal ROS     Musculoskeletal  (+) Arthritis ,   Abdominal (+) - obese,   Peds  Hematology negative hematology ROS (+)   Anesthesia Other Findings Past Medical History: No date: Arthritis 2007: Breast cancer ([place])     Comment:  right lumpectomy 03/31/2017: Breast cancer ([place])     Comment:  16 mm invasive mammary carcinoma, left upper outer               quadrant, T1c, N0, triple negative, histologic grade 3. No date: Family history of adverse reaction to anesthesia     Comment:  mom n/v No date: GERD (gastroesophageal reflux disease) No date: Heart murmur No date: Lumbar radiculitis No date: Lumbar stenosis 2018: Personal history of chemotherapy     Comment:  Left 2007: Personal history of radiation therapy     Comment:  Right 2018: Personal history of radiation therapy     Comment:  Left   Reproductive/Obstetrics                             Anesthesia Physical Anesthesia Plan  ASA: II  Anesthesia Plan: General   Post-op Pain Management:     Induction: Intravenous  PONV Risk Score and Plan: 2 and Ondansetron and Dexamethasone  Airway Management Planned: LMA  Additional Equipment:   Intra-op Plan:   Post-operative Plan:   Informed Consent: I have reviewed the patients History and Physical, chart, labs and discussed the procedure including the risks, benefits and alternatives for the proposed anesthesia with the patient or authorized representative who has indicated his/her understanding and acceptance.     Dental advisory given  Plan Discussed with: CRNA and Anesthesiologist  Anesthesia Plan Comments:         Anesthesia Quick Evaluation

## 2021-02-25 NOTE — H&P (Signed)
Megan Santana [id number] Apr 27, 1934     HPI:  87 y/o woman with new area of DCIS in a previously treated left breast. For mastectomy.   Medications Prior to Admission  Medication Sig Dispense Refill Last Dose  . acetaminophen (TYLENOL) 500 MG tablet Take 1,000 mg by mouth every 6 (six) hours as needed (for pain.).   02/24/2021 at Unknown time  . calcium carbonate (TUMS EX) 750 MG chewable tablet Chew 1-2 tablets by mouth 3 (three) times daily as needed (for heartburn/indigestion).   02/24/2021 at Unknown time  . ibrutinib (IMBRUVICA) 420 MG TABS Take 420 mg by mouth daily. 90 tablet 1 02/24/2021 at Unknown time  . Magnesium Oxide 250 MG TABS Take 250 mg by mouth daily.    02/24/2021 at Unknown time  . Multiple Minerals-Vitamins (CAL MAG ZINC +D3 PO) Take 1 tablet by mouth daily.   02/24/2021 at Unknown time  . Multiple Vitamins-Minerals (CENTRUM SILVER 50+WOMEN) TABS Take 1 tablet by mouth daily.   02/24/2021 at Unknown time  . nitrofurantoin, macrocrystal-monohydrate, (MACROBID) 100 MG capsule Take 100 mg by mouth 2 (two) times daily.   02/24/2021 at Unknown time  . Potassium 99 MG TABS Take 99 mg by mouth daily.    02/24/2021 at Unknown time   Allergies  Allergen Reactions  . Oxycodone Other (See Comments)    Can't sleep, feels bad when taking.   Past Medical History:  Diagnosis Date  . Arthritis   . Breast cancer [facility]) 2007   right lumpectomy  . Breast cancer ([place]) 03/31/2017   16 mm invasive mammary carcinoma, left upper outer quadrant, T1c, N0, triple negative, histologic grade 3.  . Family history of adverse reaction to anesthesia    mom n/v  . GERD (gastroesophageal reflux disease)   . Heart murmur   . Lumbar radiculitis   . Lumbar stenosis   . Personal history of chemotherapy 2018   Left  . Personal history of radiation therapy 2007   Right  . Personal history of radiation therapy 2018   Left   Past Surgical History:  Procedure Laterality Date  . ABDOMINAL HYSTERECTOMY      total  . BACK SURGERY     lumbar  . BREAST BIOPSY Bilateral 03/31/2017   bilat u/s core INVASIVE MAMMARY CARCINOMA  . BREAST BIOPSY Right 03/31/2017   DENSE FIBROSIS AND SHEETS OF MACROPHAGES  . BREAST EXCISIONAL BIOPSY Left 2018  . BREAST LUMPECTOMY Right 2007  . BREAST LUMPECTOMY Left 04/23/2017  . BREAST LUMPECTOMY Left 04/23/2017   Procedure: BREAST LUMPECTOMY WITH EXCISION OF SENTINEL NODE;  Surgeon: Robert Bellow, MD;  Whole breast radiation ORS;  Service: General;  Laterality: Left;  . BREAST SURGERY Right    Wide excision  . COLONOSCOPY    . EYE SURGERY     cataracts bil  . FRACTURE SURGERY Left 2015 or 2016  . TUMOR EXCISION  2001   Social History   Socioeconomic History  . Marital status: Widowed    Spouse name: Not on file  . Number of children: Not on file  . Years of education: Not on file  . Highest education level: Not on file  Occupational History  . Not on file  Tobacco Use  . Smoking status: Former Smoker    Packs/day: 0.25    Years: 10.00    Pack years: 2.50    Types: Cigarettes    Quit date: 1970    Years since quitting: 52.2  . Smokeless tobacco:  Never Used  Vaping Use  . Vaping Use: Never used  Substance and Sexual Activity  . Alcohol use: Not Currently  . Drug use: No  . Sexual activity: Not on file  Other Topics Concern  . Not on file  Social History Narrative  . Not on file   Social Determinants of Health   Financial Resource Strain: Not on file  Food Insecurity: Not on file  Transportation Needs: Not on file  Physical Activity: Not on file  Stress: Not on file  Social Connections: Not on file  Intimate Partner Violence: Not on file   Social History   Social History Narrative  . Not on file     ROS: Negative.     PE: HEENT: Negative. Lungs: Clear. Cardio: RR.  Assessment/Plan:  Proceed with planned left mastectomy with SLN biopsy.    [facility] 02/25/2021

## 2021-02-25 NOTE — Anesthesia Procedure Notes (Signed)
Procedure Name: LMA Insertion Date/Time: 02/25/2021 1:12 PM Performed by: Tollie Eth, CRNA Pre-anesthesia Checklist: Patient identified, Patient being monitored, Timeout performed, Emergency Drugs available and Suction available Patient Re-evaluated:Patient Re-evaluated prior to induction Oxygen Delivery Method: Circle system utilized Preoxygenation: Pre-oxygenation with 100% oxygen Induction Type: IV induction Ventilation: Mask ventilation without difficulty LMA: LMA inserted Tube type: Oral Number of attempts: 1 Placement Confirmation: positive ETCO2 and breath sounds checked- equal and bilateral Tube secured with: Tape Dental Injury: Teeth and Oropharynx as per pre-operative assessment

## 2021-02-25 NOTE — Anesthesia Postprocedure Evaluation (Signed)
Anesthesia Post Note  Patient: Megan Santana  Procedure(s) Performed: SIMPLE MASTECTOMY WITH AXILLARY SENTINEL NODE BIOPSY (Left Breast)  Patient location during evaluation: PACU Anesthesia Type: General Level of consciousness: awake and alert and oriented Pain management: pain level controlled Vital Signs Assessment: post-procedure vital signs reviewed and stable Respiratory status: spontaneous breathing, nonlabored ventilation and respiratory function stable Cardiovascular status: blood pressure returned to baseline and stable Postop Assessment: no signs of nausea or vomiting Anesthetic complications: no   No complications documented.   Last Vitals:  Vitals:   02/25/21 1502 02/25/21 1512  BP:    Pulse:  68  Resp:  (!) 22  Temp: 36.9 C   SpO2:  96%    Last Pain:  Vitals:   02/25/21 1502  TempSrc:   PainSc: Asleep                 Kam Kushnir

## 2021-02-25 NOTE — Discharge Instructions (Signed)

## 2021-02-26 ENCOUNTER — Other Ambulatory Visit: Payer: Self-pay | Admitting: Oncology

## 2021-02-26 ENCOUNTER — Other Ambulatory Visit: Payer: Self-pay | Admitting: *Deleted

## 2021-02-26 ENCOUNTER — Encounter: Payer: Self-pay | Admitting: General Surgery

## 2021-02-26 DIAGNOSIS — C9110 Chronic lymphocytic leukemia of B-cell type not having achieved remission: Secondary | ICD-10-CM

## 2021-02-26 MED ORDER — IMBRUVICA 420 MG PO TABS
420.0000 mg | ORAL_TABLET | Freq: Every day | ORAL | 3 refills | Status: DC
Start: 2021-02-26 — End: 2021-02-26

## 2021-03-05 ENCOUNTER — Other Ambulatory Visit: Payer: Self-pay | Admitting: *Deleted

## 2021-03-05 DIAGNOSIS — C9110 Chronic lymphocytic leukemia of B-cell type not having achieved remission: Secondary | ICD-10-CM

## 2021-03-05 MED FILL — IMBRUVICA 420 MG TAB: 420 | 28 days supply | Qty: 28 | Fill #4

## 2021-03-14 ENCOUNTER — Other Ambulatory Visit (HOSPITAL_COMMUNITY): Payer: Self-pay

## 2021-03-14 DIAGNOSIS — D0512 Intraductal carcinoma in situ of left breast: Secondary | ICD-10-CM | POA: Insufficient documentation

## 2021-03-14 NOTE — Progress Notes (Signed)
  [place]  [phone] [phone]) [phone]  ID: Megan Santana OB: [birth year]  MR#: [id number]  XTA#:[id number]  Patient Care Team: Lynnell Jude, MD as PCP - General (Family Medicine) Rico Junker, RN as Oncology Nurse Navigator Lloyd Huger, MD as Consulting Physician (Oncology) Clemmie Krill Lynnell Jude, MD as Referring Physician (Family Medicine) Bary Castilla Forest Gleason, MD (General Surgery) Noreene Filbert, MD as Referring Physician (Radiation Oncology)       This encounter was created in error - please disregard.

## 2021-03-21 ENCOUNTER — Inpatient Hospital Stay: Payer: Medicare HMO | Admitting: Pharmacist

## 2021-03-21 ENCOUNTER — Inpatient Hospital Stay: Payer: Medicare HMO | Admitting: Oncology

## 2021-03-21 ENCOUNTER — Encounter: Payer: Self-pay | Admitting: Oncology

## 2021-03-21 ENCOUNTER — Inpatient Hospital Stay: Payer: Medicare HMO | Attending: Oncology

## 2021-03-21 ENCOUNTER — Other Ambulatory Visit (HOSPITAL_COMMUNITY): Payer: Self-pay

## 2021-03-21 DIAGNOSIS — F419 Anxiety disorder, unspecified: Secondary | ICD-10-CM | POA: Diagnosis not present

## 2021-03-21 DIAGNOSIS — D0512 Intraductal carcinoma in situ of left breast: Secondary | ICD-10-CM

## 2021-03-21 DIAGNOSIS — Z170 Estrogen receptor positive status [ER+]: Secondary | ICD-10-CM | POA: Insufficient documentation

## 2021-03-21 DIAGNOSIS — Z79899 Other long term (current) drug therapy: Secondary | ICD-10-CM | POA: Diagnosis not present

## 2021-03-21 DIAGNOSIS — Z79810 Long term (current) use of selective estrogen receptor modulators (SERMs): Secondary | ICD-10-CM | POA: Insufficient documentation

## 2021-03-21 DIAGNOSIS — Z923 Personal history of irradiation: Secondary | ICD-10-CM | POA: Diagnosis not present

## 2021-03-21 DIAGNOSIS — Z87891 Personal history of nicotine dependence: Secondary | ICD-10-CM | POA: Diagnosis not present

## 2021-03-21 DIAGNOSIS — Z803 Family history of malignant neoplasm of breast: Secondary | ICD-10-CM | POA: Insufficient documentation

## 2021-03-21 DIAGNOSIS — Z9012 Acquired absence of left breast and nipple: Secondary | ICD-10-CM | POA: Insufficient documentation

## 2021-03-21 DIAGNOSIS — Z853 Personal history of malignant neoplasm of breast: Secondary | ICD-10-CM | POA: Insufficient documentation

## 2021-03-21 DIAGNOSIS — C9110 Chronic lymphocytic leukemia of B-cell type not having achieved remission: Secondary | ICD-10-CM

## 2021-03-21 DIAGNOSIS — Z801 Family history of malignant neoplasm of trachea, bronchus and lung: Secondary | ICD-10-CM | POA: Insufficient documentation

## 2021-03-21 DIAGNOSIS — Z8249 Family history of ischemic heart disease and other diseases of the circulatory system: Secondary | ICD-10-CM | POA: Diagnosis not present

## 2021-03-21 LAB — COMPREHENSIVE METABOLIC PANEL
ALT: 17 U/L (ref 0–44)
AST: 18 U/L (ref 15–41)
Albumin: 3.7 g/dL (ref 3.5–5.0)
Alkaline Phosphatase: 67 U/L (ref 38–126)
Anion gap: 8 (ref 5–15)
BUN: 17 mg/dL (ref 8–23)
CO2: 23 mmol/L (ref 22–32)
Calcium: 9.1 mg/dL (ref 8.9–10.3)
Chloride: 107 mmol/L (ref 98–111)
Creatinine, Ser: 0.94 mg/dL (ref 0.44–1.00)
GFR, Estimated: 60 mL/min — ABNORMAL LOW (ref >60)
Glucose, Bld: 101 mg/dL — ABNORMAL HIGH (ref 70–99)
Potassium: 4.4 mmol/L (ref 3.5–5.1)
Sodium: 138 mmol/L (ref 135–145)
Total Bilirubin: 0.9 mg/dL (ref 0.3–1.2)
Total Protein: 6.4 g/dL — ABNORMAL LOW (ref 6.5–8.1)

## 2021-03-21 LAB — PHOSPHORUS: Phosphorus: 4.1 mg/dL (ref 2.5–4.6)

## 2021-03-21 LAB — CBC WITH DIFFERENTIAL/PLATELET
Abs Immature Granulocytes: 0.04 10*3/uL (ref 0.00–0.07)
Basophils Absolute: 0.1 10*3/uL (ref 0.0–0.1)
Basophils Relative: 1 %
Eosinophils Absolute: 0.4 10*3/uL (ref 0.0–0.5)
Eosinophils Relative: 5 %
HCT: 32 % — ABNORMAL LOW (ref 36.0–46.0)
Hemoglobin: 10.5 g/dL — ABNORMAL LOW (ref 12.0–15.0)
Immature Granulocytes: 0 %
Lymphocytes Relative: 40 %
Lymphs Abs: 3.6 10*3/uL (ref 0.7–4.0)
MCH: 28.4 pg (ref 26.0–34.0)
MCHC: 32.8 g/dL (ref 30.0–36.0)
MCV: 86.5 fL (ref 80.0–100.0)
Monocytes Absolute: 0.9 10*3/uL (ref 0.1–1.0)
Monocytes Relative: 9 %
Neutro Abs: 4.2 10*3/uL (ref 1.7–7.7)
Neutrophils Relative %: 45 %
Platelets: 196 10*3/uL (ref 150–400)
RBC: 3.7 MIL/uL — ABNORMAL LOW (ref 3.87–5.11)
RDW: 15 % (ref 11.5–15.5)
WBC: 9.2 10*3/uL (ref 4.0–10.5)
nRBC: 0 % (ref 0.0–0.2)

## 2021-03-21 LAB — MAGNESIUM: Magnesium: 2 mg/dL (ref 1.7–2.4)

## 2021-03-23 NOTE — Progress Notes (Signed)
[place]  Telephone:(336) [phone] Fax:(336) [phone]  ID: Megan Santana OB: 05/05/1934  MR#: [id number]  EXN#:[id number]  Patient Care Team: Lynnell Jude, MD as PCP - General (Family Medicine) Rico Junker, RN as Oncology Nurse Navigator Lloyd Huger, MD as Consulting Physician (Oncology) Clemmie Krill Lynnell Jude, MD as Referring Physician (Family Medicine) Bary Castilla Forest Gleason, MD (General Surgery) Noreene Filbert, MD as Referring Physician (Radiation Oncology)   CHIEF COMPLAINT:  CLL, q13 deletion.  DCIS left breast  INTERVAL HISTORY: Patient returns to clinic today for further evaluation and discussion of her final pathology results.  She underwent total mastectomy on February 25, 2021.  She continues to have issues with wound healing, but otherwise feels well and is asymptomatic.  She recently reinitiated Imbruvica approximately 1 week ago. She has no neurologic complaints. She denies any recent fevers or illnesses.  She denies any night sweats or unintentional weight loss.  She has no chest pain, shortness of breath, cough, or hemoptysis.  She denies any nausea, vomiting, constipation, or diarrhea. She has no urinary complaints.  Patient offers no further specific complaints today.  REVIEW OF SYSTEMS:   Review of Systems  Constitutional: Negative.  Negative for fever, malaise/fatigue and weight loss.  Respiratory: Negative.  Negative for cough, hemoptysis and shortness of breath.   Cardiovascular: Negative.  Negative for chest pain and leg swelling.  Gastrointestinal: Negative.  Negative for abdominal pain and constipation.  Genitourinary: Negative.  Negative for dysuria and flank pain.  Musculoskeletal: Negative.  Negative for back pain and joint pain.  Skin: Negative.  Negative for rash.  Neurological: Negative.  Negative for sensory change, focal weakness, weakness and headaches.  Psychiatric/Behavioral: Negative.  The patient is not nervous/anxious and  does not have insomnia.     As per HPI. Otherwise, a complete review of systems is negative.  PAST MEDICAL HISTORY: Past Medical History:  Diagnosis Date  . Arthritis   . Breast cancer [facility]) 2007   right lumpectomy  . Breast cancer ([place]) 03/31/2017   16 mm invasive mammary carcinoma, left upper outer quadrant, T1c, N0, triple negative, histologic grade 3.  . Family history of adverse reaction to anesthesia    mom n/v  . GERD (gastroesophageal reflux disease)   . Heart murmur   . Lumbar radiculitis   . Lumbar stenosis   . Personal history of chemotherapy 2018   Left  . Personal history of radiation therapy 2007   Right  . Personal history of radiation therapy 2018   Left    PAST SURGICAL HISTORY: Past Surgical History:  Procedure Laterality Date  . ABDOMINAL HYSTERECTOMY     total  . BACK SURGERY     lumbar  . BREAST BIOPSY Bilateral 03/31/2017   bilat u/s core INVASIVE MAMMARY CARCINOMA  . BREAST BIOPSY Right 03/31/2017   DENSE FIBROSIS AND SHEETS OF MACROPHAGES  . BREAST EXCISIONAL BIOPSY Left 2018  . BREAST LUMPECTOMY Right 2007  . BREAST LUMPECTOMY Left 04/23/2017  . BREAST LUMPECTOMY Left 04/23/2017   Procedure: BREAST LUMPECTOMY WITH EXCISION OF SENTINEL NODE;  Surgeon: Robert Bellow, MD;  Whole breast radiation ORS;  Service: General;  Laterality: Left;  . BREAST SURGERY Right    Wide excision  . COLONOSCOPY    . EYE SURGERY     cataracts bil  . FRACTURE SURGERY Left 2015 or 2016  . SIMPLE MASTECTOMY WITH AXILLARY SENTINEL NODE BIOPSY Left 02/25/2021   Procedure: SIMPLE MASTECTOMY WITH AXILLARY SENTINEL NODE BIOPSY;  Surgeon: Robert Bellow, MD;  Location: [facility] ORS;  Service: General;  Laterality: Left;  . TUMOR EXCISION  2001    FAMILY HISTORY: Family History  Problem Relation Age of Onset  . Breast cancer Sister 16  . Lung cancer Mother 28       mets to breast  . Lung cancer Father 39  . Heart attack Brother   . Breast cancer Maternal Aunt         age unknown  . Stroke Sister 44    ADVANCED DIRECTIVES (Y/N):  N  HEALTH MAINTENANCE: Social History   Tobacco Use  . Smoking status: Former Smoker    Packs/day: 0.25    Years: 10.00    Pack years: 2.50    Types: Cigarettes    Quit date: 1970    Years since quitting: 52.3  . Smokeless tobacco: Never Used  Vaping Use  . Vaping Use: Never used  Substance Use Topics  . Alcohol use: Not Currently  . Drug use: No     Colonoscopy:  PAP:  Bone density:  Lipid panel:  Allergies  Allergen Reactions  . Oxycodone Other (See Comments)    Can't sleep, feels bad when taking.    Current Outpatient Medications  Medication Sig Dispense Refill  . acetaminophen (TYLENOL) 500 MG tablet Take 1,000 mg by mouth every 6 (six) hours as needed (for pain.).    Marland Kitchen calcium carbonate (TUMS EX) 750 MG chewable tablet Chew 1-2 tablets by mouth 3 (three) times daily as needed (for heartburn/indigestion).    . ibrutinib 420 MG TABS TAKE 1 TABLET (420 MG) BY MOUTH DAILY. 28 tablet 3  . Magnesium Oxide 250 MG TABS Take 250 mg by mouth daily.     . Multiple Minerals-Vitamins (CAL MAG ZINC +D3 PO) Take 1 tablet by mouth daily.    . Multiple Vitamins-Minerals (CENTRUM SILVER 50+WOMEN) TABS Take 1 tablet by mouth daily.    . nitrofurantoin, macrocrystal-monohydrate, (MACROBID) 100 MG capsule Take 100 mg by mouth 2 (two) times daily.    . Potassium 99 MG TABS Take 99 mg by mouth daily.     . traMADol (ULTRAM) 50 MG tablet Take 1 tablet (50 mg total) by mouth every 4 (four) hours as needed. 12 tablet 0   No current facility-administered medications for this visit.    OBJECTIVE: Vitals:   03/26/21 1328  BP: (!) 145/74  Pulse: 76  Resp: 20  Temp: 99.3 F (37.4 C)     Body mass index is 28.17 kg/m.    ECOG FS:0 - Asymptomatic  General: Well-developed, well-nourished, no acute distress. Eyes: Pink conjunctiva, anicteric sclera. HEENT: Normocephalic, moist mucous membranes. Breast: Left  mastectomy.  Open wound healing by secondary intention approximately 2 cm. Lungs: No audible wheezing or coughing. Heart: Regular rate and rhythm. Abdomen: Soft, nontender, no obvious distention. Musculoskeletal: No edema, cyanosis, or clubbing. Neuro: Alert, answering all questions appropriately. Cranial nerves grossly intact. Skin: No rashes or petechiae noted. Psych: Normal affect.   LAB RESULTS:  Lab Results  Component Value Date   NA 138 03/21/2021   K 4.4 03/21/2021   CL 107 03/21/2021   CO2 23 03/21/2021   GLUCOSE 101 (H) 03/21/2021   BUN 17 03/21/2021   CREATININE 0.94 03/21/2021   CALCIUM 9.1 03/21/2021   PROT 6.4 (L) 03/21/2021   ALBUMIN 3.7 03/21/2021   AST 18 03/21/2021   ALT 17 03/21/2021   ALKPHOS 67 03/21/2021   BILITOT 0.9 03/21/2021   GFRNONAA  60 (L) 03/21/2021   GFRAA >60 09/10/2020    Lab Results  Component Value Date   WBC 9.2 03/21/2021   NEUTROABS 4.2 03/21/2021   HGB 10.5 (L) 03/21/2021   HCT 32.0 (L) 03/21/2021   MCV 86.5 03/21/2021   PLT 196 03/21/2021     STUDIES: NM Sentinel Node Inj-No Rpt (Breast)  Result Date: 02/25/2021 Sulfur colloid was injected by the nuclear medicine technologist for melanoma sentinel node.    ASSESSMENT:  CLL, q13 deletion.  DCIS left breast  PLAN:    1. CLL, 13 deletion.  CT scan results from February 04, 2021 reviewed independently and reported as above with essentially resolution of extensive lymphadenopathy throughout the chest, abdomen, pelvis.  Patient's white blood cell count is now within normal limits.  She 420 mg Imbruvica in October 2021 after a reaction to Rituxan.  Treatment was discontinued temporarily surrounding her surgery has been instructed to continue to hold Imbruvica as her surgical wound heals.  Return to clinic in 1 month for further evaluation and reinitiation of treatment.  Appreciate clinical pharmacy input.  Consider repeat imaging in August 2022.  2.  Left breast DCIS: Patient  underwent total mastectomy on February 25, 2021 confirming diagnosis.  She does not require adjuvant XRT.  Patient was given a prescription for tamoxifen today which she will take for a total of 5 years completing treatment in April 2027.  Return to clinic in 1 month as above.   3.  History of pathologic stage IB triple negative invasive carcinoma of the upper outer quadrant of the left breast: Patient's previous breast cancer was in her right breast and was ER/PR positive. This was likely a second primary.  Patient completed 4 cycles of Taxotere and Cytoxan August 06, 2017.  Also completed adjuvant XRT.  An aromatase inhibitor would not be of any benefit given the ER/PR status of her tumor.   3. 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 Oncotype score 17: Originally diagnosed in 2008. Patient completed 5 years of Arimidex in approximately August 2013. Biopsy of the right breast only revealed fat necrosis at the site of her previous MammoSite.  Mammogram and possible biopsy as above. 4. Genetic testing: Patient has a sister and mother both with breast cancer. 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 5.  Flank pain: Patient does not complain of this today.  Secondary to splenomegaly.  Imbruvica as above.  Continue Vicodin sparingly.   6.  Anxiety: Chronic and unchanged.  Continue Xanax as needed. 7.  Mastectomy wound: Patient has an appointment with surgery later this afternoon.  Hold Imbruvica as above.   Patient expressed understanding and was in agreement with this plan. She also understands that She can call clinic at any time with any questions, concerns, or complaints.   Cancer Staging CLL (chronic lymphocytic leukemia) ([place]) Staging form: Chronic Lymphocytic Leukemia / Small Lymphocytic Lymphoma, AJCC 8th Edition - Clinical stage from 03/14/2021: Modified Rai Stage I (Modified Rai risk:  Intermediate, Lymphocytosis: Present, Adenopathy: Present, Organomegaly: Absent, Anemia: Absent, Thrombocytopenia: Absent) - Signed by Lloyd Huger, MD on 03/14/2021 Stage prefix: Initial diagnosis  Carcinoma of upper-outer quadrant of left breast in female, estrogen receptor negative ([place]) Staging form: Breast, AJCC 8th Edition - Clinical stage from 04/08/2017: Stage IB (cT1c, cN0, cM0, G3, ER: Negative, PR: Negative, HER2: Negative) - Signed by Lloyd Huger, MD on 04/08/2017  Histologic grading system: 3 grade system Laterality: Right - Pathologic stage from 05/11/2017: Stage IB (pT1c, pN0, cM0, G3, ER: Negative, PR: Negative, HER2: Negative) - Signed by Lloyd Huger, MD on 05/11/2017 Neoadjuvant therapy: No Histologic grading system: 3 grade system Laterality: Left  Ductal carcinoma in situ (DCIS) of left breast Staging form: Breast, AJCC 8th Edition - Clinical stage from 03/14/2021: Stage 0 (cTis (DCIS), cN0, cM0, ER+, PR+, HER2-) - Signed by Lloyd Huger, MD on 03/14/2021 Nuclear grade: Patrecia Pour, MD   03/27/2021 6:54 AM

## 2021-03-25 LAB — COMP PANEL: LEUKEMIA/LYMPHOMA: Immunophenotypic Profile: 25

## 2021-03-26 ENCOUNTER — Other Ambulatory Visit: Payer: Self-pay | Admitting: General Surgery

## 2021-03-26 ENCOUNTER — Inpatient Hospital Stay: Payer: Medicare HMO | Admitting: Pharmacist

## 2021-03-26 ENCOUNTER — Encounter: Payer: Self-pay | Admitting: Oncology

## 2021-03-26 ENCOUNTER — Inpatient Hospital Stay: Payer: Medicare HMO | Admitting: Oncology

## 2021-03-26 VITALS — BP 145/74 | HR 76 | Temp 99.3°F | Resp 20 | Wt 154.0 lb

## 2021-03-26 DIAGNOSIS — C9110 Chronic lymphocytic leukemia of B-cell type not having achieved remission: Secondary | ICD-10-CM

## 2021-03-26 DIAGNOSIS — D0512 Intraductal carcinoma in situ of left breast: Secondary | ICD-10-CM | POA: Diagnosis not present

## 2021-03-26 NOTE — Progress Notes (Signed)
Patient reports fluid build up at surgical site she is seeing Dr. Rosalin Hawking this afternoon.

## 2021-03-26 NOTE — Progress Notes (Signed)
 Oral Chemotherapy Clinic [facility]  [phone] [phone]  Patient Care Team: Bliss, Laura K, MD as PCP - General (Family Medicine) Lambert, Sheena M, RN as Oncology Nurse Navigator Finnegan, Timothy J, MD as Consulting Physician (Oncology) Bliss, Laura K, MD as Referring Physician (Family Medicine) Byrnett, Jeffrey W, MD (General Surgery) Chrystal, Glenn, MD as Referring Physician (Radiation Oncology)   Name of the patient: Megan Santana  [id number]  06/10/1934   Date of visit: 03/26/21  HPI: Patient is a 86 y.o. female with CLL, 13q deletion. She was originally treated with rituximab, but had a reaction to her first infusion. She was then switched to treatment with Imbruvica (ibrutinib). Ibrutinib started on 08/28/20. Patient's daughter is present at the visit today.  Reason for Consult: Oral chemotherapy follow-up for ibrutinib therapy.   PAST MEDICAL HISTORY: Past Medical History:  Diagnosis Date  . Arthritis   . Breast cancer ([facility]) 2007   right lumpectomy  . Breast cancer ([facility]) 03/31/2017   16 mm invasive mammary carcinoma, left upper outer quadrant, T1c, N0, triple negative, histologic grade 3.  . Family history of adverse reaction to anesthesia    mom n/v  . GERD (gastroesophageal reflux disease)   . Heart murmur   . Lumbar radiculitis   . Lumbar stenosis   . Personal history of chemotherapy 2018   Left  . Personal history of radiation therapy 2007   Right  . Personal history of radiation therapy 2018   Left    PAST SURGICAL HISTORY:  Past Surgical History:  Procedure Laterality Date  . ABDOMINAL HYSTERECTOMY     total  . BACK SURGERY     lumbar  . BREAST BIOPSY Bilateral 03/31/2017   bilat u/s core INVASIVE MAMMARY CARCINOMA  . BREAST BIOPSY Right 03/31/2017   DENSE FIBROSIS AND SHEETS OF MACROPHAGES  . BREAST EXCISIONAL BIOPSY Left 2018  . BREAST LUMPECTOMY Right 2007  . BREAST LUMPECTOMY Left 04/23/2017   . BREAST LUMPECTOMY Left 04/23/2017   Procedure: BREAST LUMPECTOMY WITH EXCISION OF SENTINEL NODE;  Surgeon: Byrnett, Jeffrey W, MD;  Whole breast radiation ORS;  Service: General;  Laterality: Left;  . BREAST SURGERY Right    Wide excision  . COLONOSCOPY    . EYE SURGERY     cataracts bil  . FRACTURE SURGERY Left 2015 or 2016  . SIMPLE MASTECTOMY WITH AXILLARY SENTINEL NODE BIOPSY Left 02/25/2021   Procedure: SIMPLE MASTECTOMY WITH AXILLARY SENTINEL NODE BIOPSY;  Surgeon: Byrnett, Jeffrey W, MD;  Location: [facility] ORS;  Service: General;  Laterality: Left;  . TUMOR EXCISION  2001    HEMATOLOGY/ONCOLOGY HISTORY:  Oncology History  CLL (chronic lymphocytic leukemia) ([facility])  02/10/2020 Initial Diagnosis   CLL (chronic lymphocytic leukemia) ([facility])   08/17/2020 - 08/17/2020 Chemotherapy   The patient had riTUXimab-pvvr (RUXIENCE) 700 mg in sodium chloride 0.9 % 250 mL (2.1875 mg/mL) infusion, 375 mg/m2 = 700 mg, Intravenous,  Once, 1 of 4 cycles Administration: 700 mg (08/17/2020)  for chemotherapy treatment.    03/14/2021 Cancer Staging   Staging form: Chronic Lymphocytic Leukemia / Small Lymphocytic Lymphoma, AJCC 8th Edition - Clinical stage from 03/14/2021: Modified Rai Stage I (Modified Rai risk: Intermediate, Lymphocytosis: Present, Adenopathy: Present, Organomegaly: Absent, Anemia: Absent, Thrombocytopenia: Absent) - Signed by Finnegan, Timothy J, MD on 03/14/2021 Stage prefix: Initial diagnosis   Ductal carcinoma in situ (DCIS) of left breast  03/14/2021 Initial Diagnosis   Ductal carcinoma in situ (DCIS) of left breast   03/14/2021   Cancer Staging   Staging form: Breast, AJCC 8th Edition - Clinical stage from 03/14/2021: Stage 0 (cTis (DCIS), cN0, cM0, ER+, PR+, HER2-) - Signed by Lloyd Huger, MD on 03/14/2021 Nuclear grade: G1     ALLERGIES:  is allergic to oxycodone.  MEDICATIONS:  Current Outpatient Medications  Medication Sig Dispense Refill  . acetaminophen (TYLENOL) 500 MG  tablet Take 1,000 mg by mouth every 6 (six) hours as needed (for pain.).    Marland Kitchen calcium carbonate (TUMS EX) 750 MG chewable tablet Chew 1-2 tablets by mouth 3 (three) times daily as needed (for heartburn/indigestion).    . ibrutinib 420 MG TABS TAKE 1 TABLET (420 MG) BY MOUTH DAILY. 28 tablet 3  . Magnesium Oxide 250 MG TABS Take 250 mg by mouth daily.     . Multiple Minerals-Vitamins (CAL MAG ZINC +D3 PO) Take 1 tablet by mouth daily.    . Multiple Vitamins-Minerals (CENTRUM SILVER 50+WOMEN) TABS Take 1 tablet by mouth daily.    . nitrofurantoin, macrocrystal-monohydrate, (MACROBID) 100 MG capsule Take 100 mg by mouth 2 (two) times daily.    . Potassium 99 MG TABS Take 99 mg by mouth daily.     . traMADol (ULTRAM) 50 MG tablet Take 1 tablet (50 mg total) by mouth every 4 (four) hours as needed. 12 tablet 0   No current facility-administered medications for this visit.    VITAL SIGNS: There were no vitals taken for this visit. There were no vitals filed for this visit.  Estimated body mass index is 28.81 kg/m as calculated from the following:   Height as of 09/15/20: 5' 2" (1.575 m).   Weight as of 02/19/21: 71.4 kg (157 lb 8 oz).  LABS: CBC:    Component Value Date/Time   WBC 9.2 03/21/2021 0852   HGB 10.5 (L) 03/21/2021 0852   HCT 32.0 (L) 03/21/2021 0852   PLT 196 03/21/2021 0852   MCV 86.5 03/21/2021 0852   NEUTROABS 4.2 03/21/2021 0852   LYMPHSABS 3.6 03/21/2021 0852   MONOABS 0.9 03/21/2021 0852   EOSABS 0.4 03/21/2021 0852   BASOSABS 0.1 03/21/2021 0852   Comprehensive Metabolic Panel:    Component Value Date/Time   NA 138 03/21/2021 0852   K 4.4 03/21/2021 0852   CL 107 03/21/2021 0852   CO2 23 03/21/2021 0852   BUN 17 03/21/2021 0852   CREATININE 0.94 03/21/2021 0852   GLUCOSE 101 (H) 03/21/2021 0852   CALCIUM 9.1 03/21/2021 0852   AST 18 03/21/2021 0852   ALT 17 03/21/2021 0852   ALKPHOS 67 03/21/2021 0852   BILITOT 0.9 03/21/2021 0852   PROT 6.4 (L) 03/21/2021  0852   ALBUMIN 3.7 03/21/2021 0852    RADIOGRAPHIC STUDIES: NM Sentinel Node Inj-No Rpt (Breast)  Result Date: 02/25/2021 Sulfur colloid was injected by the nuclear medicine technologist for melanoma sentinel node.     Assessment and Plan-  ALC now wnl and LFTs continue to be normal. Ms. Liera help her ibrutinib due to surgery and resumed treatment on 03/19/21. They reported that her wound was healing okay post surgery at their last check in with Dr. Bary Castilla, but lately it is not looking quite as good. They have an appt with him today.   Discussed her great response to treatment currently and controlled CLL, if she needed to resume her hold treatment for a bit longer continue to healing we would not expect her CLL to come back rapidly.  Plan to hold ibrutinib 433m daily until  next follow-up visit.    Oral Chemotherapy Side Effect/Intolerance:  -Bowel movements: loose stools reported, occur ~twice daily. She strugles with both constipation and diarrhea. The loose stools are not bothersome and she does not want to risk causing constipation. She knows to use loperamide if diarrhea worsen.   Oral Chemotherapy Adherence: no missed doses reported (other than planned hold)  Medication Access Issues: no issues, due to surgery hold patient has a surplus of med at home. Refill call from [facility] pushed back.  No patient barriers to medication adherence identified.   Patient expressed understanding and was in agreement with this plan. She also understands that She can call clinic at any time with any questions, concerns, or complaints.   Thank you for allowing me to participate in the care of this very pleasant patient.   Time Total: 15 mins  Visit consisted of counseling and education on dealing with issues of symptom management in the setting of serious and potentially life-threatening illness.Greater than 50%  of this time was spent counseling and coordinating care related to  the above assessment and plan.  Signed by: Darl Pikes, PharmD, BCPS, Salley Slaughter, CPP Hematology/Oncology Clinical Pharmacist Practitioner [facility] [phone]  03/26/2021 11:04 AM

## 2021-03-26 NOTE — Progress Notes (Signed)
Subjective:     Patient ID: Megan Santana is a 87 y.o. female.  HPI  The following portions of the patient's history were reviewed and updated as appropriate.  This an established patient is here today for: office visit. The patient is here today to follow up from a left mastectomy done on 02-25-21. Patient reports she did see Dr. Grayland Ormond today and she is to start on Tamoxifen. She was also told to stop Imbruvica for 4 weeks due to an area on her breast that is not healing.   She has noticed drainage from the wound as well as some odor.  Nose bleeds yesterday and today.   The patient is accompanied today by her daughter, [name].       Chief Complaint  Patient presents with  . Post Operative Visit     BP (!) 144/64   Pulse 72   Temp 36.6 C (97.9 F)   Ht 157.5 cm (5\' 2" )   Wt 71.7 kg (158 lb)   SpO2 99%   BMI 28.90 kg/m       Past Medical History:  Diagnosis Date  . Arthritis   . Breast cancer (CMS-HCC) 2007   right  . Breast cancer (CMS-HCC) 03/31/2017   Left upper outer quadrant, T1c, N0, whole breast.  Triple negative DCIS 1 mm from anterior margin.  . Breast cancer (CMS-HCC) 11/20/2020   Left lower outer quadrant, DCIS, high-grade.  Distant from prior tumor site.  . Cardiac murmur   . Chronic lymphocytic leukemia (CMS-HCC) 09/2019  . Family history of adverse reaction to anesthesia   . GERD (gastroesophageal reflux disease)   . H/O: hysterectomy 05/1980  . History of chemotherapy 2018  . History of radiation therapy 2007  . History of radiation therapy 2008  . Hyperlipidemia   . Lumbar radiculitis   . Skin cancer of face 06/2011  . Spinal stenosis of lumbar region           Past Surgical History:  Procedure Laterality Date  . ABDOMINAL HYSTERECTOMY  05/1980  . Back Surgery  06/1997  . CATARACT EXTRACTION Bilateral   . COLONOSCOPY  02/26/2010  . MASTECTOMY PARTIAL / LUMPECTOMY Right 2007  . MASTECTOMY PARTIAL / LUMPECTOMY Left  04/2017  . MASTECTOMY SIMPLE Left 02/25/2021   Left simple mastectomy with sentinel node biopsy.  Marland Kitchen Open Reduction and internal fixation, left distal radius Left 10/27/2013  . Tumors Removed  01/2000   non cancer tumor              OB History    Gravida  3   Para  3   Term      Preterm      AB      Living        SAB      IAB      Ectopic      Molar      Multiple      Live Births          Obstetric Comments  Age at first period 62 Age of first pregnancy 48         Social History          Socioeconomic History  . Marital status: Married  Tobacco Use  . Smoking status: Former Smoker    Packs/day: 0.50    Years: 10.00    Pack years: 5.00  . Smokeless tobacco: Never Used  Substance and Sexual Activity  . Alcohol use: Yes  Comment: occasionally  . Drug use: No            Allergies  Allergen Reactions  . Oxycodone Other (See Comments)    Can't sleep, feels bad when taking.    Current Medications        Current Outpatient Medications  Medication Sig Dispense Refill  . acetaminophen (TYLENOL) 500 MG tablet Take 1,000 mg by mouth every 6 (six) hours as needed for Pain    . ascorbic acid (VITAMIN C) 500 MG tablet Take 500 mg by mouth once daily.    Marland Kitchen aspirin 81 MG EC tablet Take 81 mg by mouth once daily       . calcium carbonate (TUMS E-X) 300 mg (750 mg) chewable tablet Take by mouth    . calcium carbonate-vitamin D3 500 mg(1,250mg ) -125 unit per tablet Take 600 mg by mouth 2 (two) times daily.    . cholecalciferol (VITAMIN D3) 1000 unit tablet Take by mouth    . cyclobenzaprine (FLEXERIL) 10 MG tablet Take 10 mg by mouth 3 (three) times daily as needed    . ibuprofen (ADVIL,MOTRIN) 200 MG tablet Take 800 mg by mouth as needed for Pain.    . IMBRUVICA 420 mg Tab TAKE 1 TABLET (420MG ) BY MOUTH DAILY    . magnesium oxide (MAG-OX) 400 mg (241.3 mg magnesium) tablet Take by mouth    . multivitamin  tablet Take 1 tablet by mouth once daily.    Marland Kitchen POTASSIUM CHLORIDE ORAL Take 595 mg by mouth 2 (two) times daily.    . traMADoL (ULTRAM) 50 mg tablet Take 50 mg by mouth every 4 (four) hours as needed    . lidocaine-prilocaine (EMLA) cream Apply to areola one hour prior to arrival day of procedure. Cover with saran wrap. (Patient not taking: Reported on 03/26/2021) 5 g 0   No current facility-administered medications for this visit.           Family History  Problem Relation Age of Onset  . Lung cancer Mother   . Lung cancer Father   . Stroke Sister   . Breast cancer Sister   . Myocardial Infarction (Heart attack) Brother   . Breast cancer Maternal Aunt   . Colon cancer Neg Hx        Review of Systems  Constitutional: Negative for chills and fever.  Respiratory: Negative for cough.        Objective:   Physical Exam Exam conducted with a chaperone present.  Constitutional:      Appearance: Normal appearance.  HENT:     Nose:     Comments: Some supericial ulcerations anterior nares. No active bleeding visible.  Cardiovascular:     Rate and Rhythm: Normal rate and regular rhythm.     Pulses: Normal pulses.     Heart sounds: Normal heart sounds.  Pulmonary:     Effort: Pulmonary effort is normal.     Breath sounds: Normal breath sounds.  Chest:       Comments: Skin necrosis mid-mastectomy scar, large seroma.  Musculoskeletal:     Cervical back: Neck supple.  Skin:    General: Skin is warm and dry.  Neurological:     Mental Status: She is alert and oriented to person, place, and time.  Psychiatric:        Mood and Affect: Mood normal.        Behavior: Behavior normal.        Assessment:     Breast flap  necrosis with recurrent seroma.     Plan:     Options for management reviewed: 1) Aspiration and topical RX of superficial necrosis vs 2) debridement and drain placement.  Patient and I are both more comfortable with option 2.    Afrin and neosporin to the nares.  Surgery tomorrow.      Entered by Ledell Noss, CMA, acting as a scribe for Dr. Hervey Ard, MD.   The documentation recorded by the scribe accurately reflects the service I personally performed and the decisions made by me.   Robert Bellow, MD FACS

## 2021-03-27 ENCOUNTER — Other Ambulatory Visit: Payer: Self-pay

## 2021-03-27 ENCOUNTER — Ambulatory Visit: Payer: Medicare HMO | Admitting: Anesthesiology

## 2021-03-27 ENCOUNTER — Other Ambulatory Visit: Payer: Self-pay | Admitting: Oncology

## 2021-03-27 ENCOUNTER — Ambulatory Visit
Admission: RE | Admit: 2021-03-27 | Discharge: 2021-03-27 | Disposition: A | Discharge status: Home / Self Care | Payer: Medicare HMO | Attending: General Surgery | Admitting: General Surgery

## 2021-03-27 ENCOUNTER — Encounter: Payer: Self-pay | Admitting: General Surgery

## 2021-03-27 ENCOUNTER — Encounter
Admission: RE | Disposition: A | Discharge status: Home / Self Care | Payer: Self-pay | Source: Home / Self Care | Attending: General Surgery

## 2021-03-27 DIAGNOSIS — Z885 Allergy status to narcotic agent status: Secondary | ICD-10-CM | POA: Insufficient documentation

## 2021-03-27 DIAGNOSIS — Z9221 Personal history of antineoplastic chemotherapy: Secondary | ICD-10-CM | POA: Diagnosis not present

## 2021-03-27 DIAGNOSIS — Z853 Personal history of malignant neoplasm of breast: Secondary | ICD-10-CM | POA: Insufficient documentation

## 2021-03-27 DIAGNOSIS — Z923 Personal history of irradiation: Secondary | ICD-10-CM | POA: Insufficient documentation

## 2021-03-27 DIAGNOSIS — Z79899 Other long term (current) drug therapy: Secondary | ICD-10-CM | POA: Diagnosis not present

## 2021-03-27 DIAGNOSIS — K219 Gastro-esophageal reflux disease without esophagitis: Secondary | ICD-10-CM | POA: Diagnosis not present

## 2021-03-27 DIAGNOSIS — I96 Gangrene, not elsewhere classified: Secondary | ICD-10-CM | POA: Diagnosis not present

## 2021-03-27 DIAGNOSIS — M96843 Postprocedural seroma of a musculoskeletal structure following other procedure: Secondary | ICD-10-CM | POA: Diagnosis not present

## 2021-03-27 DIAGNOSIS — Z87891 Personal history of nicotine dependence: Secondary | ICD-10-CM | POA: Insufficient documentation

## 2021-03-27 HISTORY — PX: INCISION AND DRAINAGE ABSCESS: SHX5864

## 2021-03-27 SURGERY — INCISION AND DRAINAGE, ABSCESS
Anesthesia: General | Laterality: Left

## 2021-03-27 MED ORDER — FENTANYL CITRATE (PF) 100 MCG/2ML IJ SOLN
INTRAMUSCULAR | Status: AC
  Filled 2021-03-27: qty 2

## 2021-03-27 MED ORDER — TAMOXIFEN CITRATE 20 MG PO TABS: 20.0000 mg | ORAL_TABLET | Freq: Every day | ORAL | 3 refills | Status: DC

## 2021-03-27 MED ORDER — PROPOFOL 10 MG/ML IV BOLUS
INTRAVENOUS | Status: DC | PRN
  Administered 2021-03-27: 50 mg via INTRAVENOUS

## 2021-03-27 MED ORDER — CEFAZOLIN SODIUM-DEXTROSE 2-4 GM/100ML-% IV SOLN
2.0000 g | INTRAVENOUS | Status: AC
  Administered 2021-03-27: 2 g via INTRAVENOUS

## 2021-03-27 MED ORDER — PHENYLEPHRINE HCL (PRESSORS) 10 MG/ML IV SOLN
INTRAVENOUS | Status: DC | PRN
  Administered 2021-03-27: 200 ug via INTRAVENOUS
  Administered 2021-03-27 (×2): 100 ug via INTRAVENOUS

## 2021-03-27 MED ORDER — ONDANSETRON HCL 4 MG/2ML IJ SOLN: 4.0000 mg | Freq: Once | INTRAMUSCULAR | Status: DC | PRN

## 2021-03-27 MED ORDER — ACETAMINOPHEN 10 MG/ML IV SOLN
INTRAVENOUS | Status: DC | PRN
  Administered 2021-03-27: 1000 mg via INTRAVENOUS

## 2021-03-27 MED ORDER — PROPOFOL 500 MG/50ML IV EMUL
INTRAVENOUS | Status: DC | PRN
  Administered 2021-03-27: 155 ug/kg/min via INTRAVENOUS

## 2021-03-27 MED ORDER — LACTATED RINGERS IV SOLN: INTRAVENOUS | Status: DC

## 2021-03-27 MED ORDER — CHLORHEXIDINE GLUCONATE CLOTH 2 % EX PADS: 6.0000 | MEDICATED_PAD | Freq: Once | CUTANEOUS | Status: DC

## 2021-03-27 MED ORDER — DEXAMETHASONE SODIUM PHOSPHATE 10 MG/ML IJ SOLN
INTRAMUSCULAR | Status: DC | PRN
  Administered 2021-03-27: 10 mg via INTRAVENOUS

## 2021-03-27 MED ORDER — ORAL CARE MOUTH RINSE: 15.0000 mL | Freq: Once | OROMUCOSAL | Status: DC

## 2021-03-27 MED ORDER — FENTANYL CITRATE (PF) 100 MCG/2ML IJ SOLN
INTRAMUSCULAR | Status: DC | PRN
  Administered 2021-03-27: 12.5 ug via INTRAVENOUS

## 2021-03-27 MED ORDER — FENTANYL CITRATE (PF) 100 MCG/2ML IJ SOLN: 25.0000 ug | INTRAMUSCULAR | Status: DC | PRN

## 2021-03-27 MED ORDER — KETAMINE HCL 50 MG/5ML IJ SOSY
PREFILLED_SYRINGE | INTRAMUSCULAR | Status: AC
  Filled 2021-03-27: qty 5

## 2021-03-27 MED ORDER — ONDANSETRON HCL 4 MG/2ML IJ SOLN
INTRAMUSCULAR | Status: DC | PRN
  Administered 2021-03-27: 4 mg via INTRAVENOUS

## 2021-03-27 MED ORDER — ACETAMINOPHEN 10 MG/ML IV SOLN
INTRAVENOUS | Status: AC
  Filled 2021-03-27: qty 100

## 2021-03-27 MED ORDER — KETAMINE HCL 10 MG/ML IJ SOLN
INTRAMUSCULAR | Status: DC | PRN
  Administered 2021-03-27: 10 mg via INTRAVENOUS

## 2021-03-27 MED ORDER — CEFAZOLIN SODIUM-DEXTROSE 2-4 GM/100ML-% IV SOLN
INTRAVENOUS | Status: AC
  Filled 2021-03-27: qty 100

## 2021-03-27 MED ORDER — GLYCOPYRROLATE 0.2 MG/ML IJ SOLN
INTRAMUSCULAR | Status: DC | PRN
  Administered 2021-03-27: .2 mg via INTRAVENOUS

## 2021-03-27 MED ORDER — CHLORHEXIDINE GLUCONATE 0.12 % MT SOLN: 15.0000 mL | Freq: Once | OROMUCOSAL | Status: DC

## 2021-03-27 SURGICAL SUPPLY — 34 items
APL PRP STRL LF DISP 70% ISPRP (MISCELLANEOUS) ×1
BINDER BREAST LRG (GAUZE/BANDAGES/DRESSINGS) ×2 IMPLANT
BULB RESERV EVAC DRAIN JP 100C (MISCELLANEOUS) ×2 IMPLANT
CANISTER SUCT 1200ML W/VALVE (MISCELLANEOUS) ×2 IMPLANT
CHLORAPREP W/TINT 26 (MISCELLANEOUS) ×2 IMPLANT
COVER WAND RF STERILE (DRAPES) ×2 IMPLANT
DRAIN CHANNEL JP 15F RND 16 (MISCELLANEOUS) ×2 IMPLANT
DRAPE LAPAROTOMY 100X77 ABD (DRAPES) ×2 IMPLANT
DRSG GAUZE FLUFF 36X18 (GAUZE/BANDAGES/DRESSINGS) ×4 IMPLANT
DRSG TELFA 4X3 1S NADH ST (GAUZE/BANDAGES/DRESSINGS) ×4 IMPLANT
ELECT REM PT RETURN 9FT ADLT (ELECTROSURGICAL) ×2
ELECTRODE REM PT RTRN 9FT ADLT (ELECTROSURGICAL) ×1 IMPLANT
GLOVE SURG ENC MOIS LTX SZ7.5 (GLOVE) ×2 IMPLANT
GLOVE SURG UNDER LTX SZ8 (GLOVE) ×2 IMPLANT
GOWN STRL REUS W/ TWL LRG LVL3 (GOWN DISPOSABLE) ×2 IMPLANT
GOWN STRL REUS W/TWL LRG LVL3 (GOWN DISPOSABLE) ×4
KIT PREVENA INCISION MGT 13 (CANNISTER) ×2 IMPLANT
KIT TURNOVER KIT A (KITS) ×2 IMPLANT
LABEL OR SOLS (LABEL) ×2 IMPLANT
MANIFOLD NEPTUNE II (INSTRUMENTS) ×2 IMPLANT
NEEDLE HYPO 22GX1.5 SAFETY (NEEDLE) ×2 IMPLANT
NEEDLE HYPO 25X1 1.5 SAFETY (NEEDLE) ×2 IMPLANT
PACK BASIN MINOR ARMC (MISCELLANEOUS) ×2 IMPLANT
PIN SAFETY STRL (MISCELLANEOUS) ×2 IMPLANT
STAPLER SKIN PROX 35W (STAPLE) ×2 IMPLANT
STRIP CLOSURE SKIN 1/2X4 (GAUZE/BANDAGES/DRESSINGS) IMPLANT
SUT ETHILON 3-0 FS-10 30 BLK (SUTURE) ×2
SUT VIC AB 2-0 SH 27 (SUTURE) ×2
SUT VIC AB 2-0 SH 27XBRD (SUTURE) ×1 IMPLANT
SUT VIC AB 3-0 SH 27 (SUTURE) ×4
SUT VIC AB 3-0 SH 27X BRD (SUTURE) ×2 IMPLANT
SUTURE EHLN 3-0 FS-10 30 BLK (SUTURE) ×1 IMPLANT
SWABSTK COMLB BENZOIN TINCTURE (MISCELLANEOUS) IMPLANT
SYR 10ML LL (SYRINGE) ×2 IMPLANT

## 2021-03-27 NOTE — Discharge Instructions (Signed)
AMBULATORY SURGERY  °DISCHARGE INSTRUCTIONS ° ° °1) The drugs that you were given will stay in your system until tomorrow so for the next 24 hours you should not: ° °A) Drive an automobile °B) Make any legal decisions °C) Drink any alcoholic beverage ° ° °2) You may resume regular meals tomorrow.  Today it is better to start with liquids and gradually work up to solid foods. ° °You may eat anything you prefer, but it is better to start with liquids, then soup and crackers, and gradually work up to solid foods. ° ° °3) Please notify your doctor immediately if you have any unusual bleeding, trouble breathing, redness and pain at the surgery site, drainage, fever, or pain not relieved by medication. ° ° ° °4) Additional Instructions: ° ° ° ° ° ° ° °Please contact your physician with any problems or Same Day Surgery at [phone], Monday through Friday 6 am to 4 pm, or [facility] [place] Main number at [phone].AMBULATORY SURGERY  °DISCHARGE INSTRUCTIONS ° ° °5) The drugs that you were given will stay in your system until tomorrow so for the next 24 hours you should not: ° °D) Drive an automobile °E) Make any legal decisions °F) Drink any alcoholic beverage ° ° °6) You may resume regular meals tomorrow.  Today it is better to start with liquids and gradually work up to solid foods. ° °You may eat anything you prefer, but it is better to start with liquids, then soup and crackers, and gradually work up to solid foods. ° ° °7) Please notify your doctor immediately if you have any unusual bleeding, trouble breathing, redness and pain at the surgery site, drainage, fever, or pain not relieved by medication. ° ° ° °8) Additional Instructions: ° ° ° ° ° ° ° °Please contact your physician with any problems or Same Day Surgery at [phone], Monday through Friday 6 am to 4 pm, or [facility] [place] [facility] number at [phone]. °

## 2021-03-27 NOTE — Transfer of Care (Signed)
Immediate Anesthesia Transfer of Care Note  Patient: Megan Santana  Procedure(s) Performed: debridement left chest wall and seroma drainage (Left )  Patient Location: PACU  Anesthesia Type:General  Level of Consciousness: awake, drowsy and patient cooperative  Airway & Oxygen Therapy: Patient Spontanous Breathing and Patient connected to face mask oxygen  Post-op Assessment: Report given to RN and Post -op Vital signs reviewed and stable  Post vital signs: Reviewed and stable  Last Vitals:  Vitals Value Taken Time  BP 126/72 03/27/21 1554  Temp    Pulse 77 03/27/21 1600  Resp 20 03/27/21 1600  SpO2 95 % 03/27/21 1600  Vitals shown include unvalidated device data.  Last Pain:  Vitals:   03/27/21 1436  TempSrc: Temporal  PainSc: 0-No pain         Complications: No complications documented.

## 2021-03-27 NOTE — Anesthesia Postprocedure Evaluation (Signed)
Anesthesia Post Note  Patient: Megan Santana  Procedure(s) Performed: debridement left chest wall and seroma drainage (Left )  Patient location during evaluation: PACU Anesthesia Type: General Level of consciousness: awake and alert Pain management: pain level controlled Vital Signs Assessment: post-procedure vital signs reviewed and stable Respiratory status: spontaneous breathing, nonlabored ventilation, respiratory function stable and patient connected to nasal cannula oxygen Cardiovascular status: blood pressure returned to baseline and stable Postop Assessment: no apparent nausea or vomiting Anesthetic complications: no   No complications documented.   Last Vitals:  Vitals:   03/27/21 1620 03/27/21 1651  BP: (!) 149/54 (!) 147/76  Pulse: 64 63  Resp: (!) 22 18  Temp: 36.6 C 37 C  SpO2: 98% 100%    Last Pain:  Vitals:   03/27/21 1651  TempSrc: Temporal  PainSc: 0-No pain                 Martha Clan

## 2021-03-27 NOTE — H&P (Signed)
Megan Santana [id number] [birth year]     HPI:  87 y/o woman with flap necrosis and seroma post mastectomy for recurrent cancer after prior breast conservation surgery.  For seroma drainage, debridement of the chest wall and wound vac application.    Medications Prior to Admission  Medication Sig Dispense Refill Last Dose  . acetaminophen (TYLENOL) 500 MG tablet Take 1,000 mg by mouth every 6 (six) hours as needed (for pain.).   03/27/2021 at 1200  . calcium carbonate (TUMS EX) 750 MG chewable tablet Chew 1-2 tablets by mouth 3 (three) times daily as needed (for heartburn/indigestion).   03/26/2021 at Unknown time  . Magnesium Oxide 250 MG TABS Take 250 mg by mouth daily.    03/26/2021 at Unknown time  . Multiple Minerals-Vitamins (CAL MAG ZINC +D3 PO) Take 1 tablet by mouth daily.   03/26/2021 at Unknown time  . Multiple Vitamins-Minerals (CENTRUM SILVER 50+WOMEN) TABS Take 1 tablet by mouth daily.   03/26/2021 at Unknown time  . Potassium 99 MG TABS Take 99 mg by mouth daily.    03/26/2021 at Unknown time  . ibrutinib 420 MG TABS TAKE 1 TABLET (420 MG) BY MOUTH DAILY. (Patient not taking: Reported on 03/27/2021) 28 tablet 3 Not Taking at Unknown time  . nitrofurantoin, macrocrystal-monohydrate, (MACROBID) 100 MG capsule Take 100 mg by mouth 2 (two) times daily.     . tamoxifen (NOLVADEX) 20 MG tablet Take 1 tablet (20 mg total) by mouth daily. 90 tablet 3   . traMADol (ULTRAM) 50 MG tablet Take 1 tablet (50 mg total) by mouth every 4 (four) hours as needed. (Patient not taking: Reported on 03/27/2021) 12 tablet 0 Not Taking at Unknown time   Allergies  Allergen Reactions  . Oxycodone Other (See Comments)    Can't sleep, feels bad when taking.   Past Medical History:  Diagnosis Date  . Arthritis   . Breast cancer [facility]) 2007   right lumpectomy  . Breast cancer ([place]) 03/31/2017   16 mm invasive mammary carcinoma, left upper outer quadrant, T1c, N0, triple negative, histologic grade 3.  . Family  history of adverse reaction to anesthesia    mom n/v  . GERD (gastroesophageal reflux disease)   . Heart murmur   . Lumbar radiculitis   . Lumbar stenosis   . Personal history of chemotherapy 2018   Left  . Personal history of radiation therapy 2007   Right  . Personal history of radiation therapy 2018   Left   Past Surgical History:  Procedure Laterality Date  . ABDOMINAL HYSTERECTOMY     total  . BACK SURGERY     lumbar  . BREAST BIOPSY Bilateral 03/31/2017   bilat u/s core INVASIVE MAMMARY CARCINOMA  . BREAST BIOPSY Right 03/31/2017   DENSE FIBROSIS AND SHEETS OF MACROPHAGES  . BREAST EXCISIONAL BIOPSY Left 2018  . BREAST LUMPECTOMY Right 2007  . BREAST LUMPECTOMY Left 04/23/2017  . BREAST LUMPECTOMY Left 04/23/2017   Procedure: BREAST LUMPECTOMY WITH EXCISION OF SENTINEL NODE;  Surgeon: Robert Bellow, MD;  Whole breast radiation ORS;  Service: General;  Laterality: Left;  . BREAST SURGERY Right    Wide excision  . COLONOSCOPY    . EYE SURGERY     cataracts bil  . FRACTURE SURGERY Left 2015 or 2016  . SIMPLE MASTECTOMY WITH AXILLARY SENTINEL NODE BIOPSY Left 02/25/2021   Procedure: SIMPLE MASTECTOMY WITH AXILLARY SENTINEL NODE BIOPSY;  Surgeon: Robert Bellow, MD;  Location: [facility] ORS;  Service: General;  Laterality: Left;  . TUMOR EXCISION  2001   Social History   Socioeconomic History  . Marital status: Widowed    Spouse name: Not on file  . Number of children: Not on file  . Years of education: Not on file  . Highest education level: Not on file  Occupational History  . Not on file  Tobacco Use  . Smoking status: Former Smoker    Packs/day: 0.25    Years: 10.00    Pack years: 2.50    Types: Cigarettes    Quit date: 1970    Years since quitting: 52.3  . Smokeless tobacco: Never Used  Vaping Use  . Vaping Use: Never used  Substance and Sexual Activity  . Alcohol use: Not Currently  . Drug use: No  . Sexual activity: Not on file  Other Topics  Concern  . Not on file  Social History Narrative  . Not on file   Social Determinants of Health   Financial Resource Strain: Not on file  Food Insecurity: Not on file  Transportation Needs: Not on file  Physical Activity: Not on file  Stress: Not on file  Social Connections: Not on file  Intimate Partner Violence: Not on file   Social History   Social History Narrative  . Not on file     ROS: Negative.     PE: HEENT: Negative. Lungs: Clear. Cardio: RR.   Assessment/Plan:  Proceed with planned seroma drainage and removal of necrotic tissue.   Forest Gleason [facility] 03/27/2021

## 2021-03-27 NOTE — Anesthesia Preprocedure Evaluation (Addendum)
Anesthesia Evaluation  Patient identified by MRN, date of birth, ID band Patient awake    Reviewed: Allergy & Precautions, H&P , NPO status , Patient's Chart, lab work & pertinent test results  History of Anesthesia Complications Negative for: history of anesthetic complications  Airway Mallampati: I  TM Distance: >3 FB     Dental  (+) Teeth Intact   Pulmonary neg sleep apnea, neg COPD, former smoker,    breath sounds clear to auscultation       Cardiovascular (-) angina(-) Past MI and (-) Cardiac Stents negative cardio ROS  (-) dysrhythmias  Rhythm:regular Rate:Normal     Neuro/Psych negative neurological ROS  negative psych ROS   GI/Hepatic Neg liver ROS, GERD  Controlled,  Endo/Other  negative endocrine ROS  Renal/GU      Musculoskeletal   Abdominal   Peds  Hematology negative hematology ROS (+)   Anesthesia Other Findings Hematoma at mastectomy site  Past Medical History: No date: Arthritis 2007: Breast cancer ([place])     Comment:  right lumpectomy 03/31/2017: Breast cancer ([place])     Comment:  16 mm invasive mammary carcinoma, left upper outer               quadrant, T1c, N0, triple negative, histologic grade 3. No date: Family history of adverse reaction to anesthesia     Comment:  mom n/v No date: GERD (gastroesophageal reflux disease) No date: Heart murmur No date: Lumbar radiculitis No date: Lumbar stenosis 2018: Personal history of chemotherapy     Comment:  Left 2007: Personal history of radiation therapy     Comment:  Right 2018: Personal history of radiation therapy     Comment:  Left  Past Surgical History: No date: ABDOMINAL HYSTERECTOMY     Comment:  total No date: BACK SURGERY     Comment:  lumbar 03/31/2017: BREAST BIOPSY; Bilateral     Comment:  bilat u/s core INVASIVE MAMMARY CARCINOMA 03/31/2017: BREAST BIOPSY; Right     Comment:  DENSE FIBROSIS AND SHEETS OF  MACROPHAGES 2018: BREAST EXCISIONAL BIOPSY; Left 2007: BREAST LUMPECTOMY; Right 04/23/2017: BREAST LUMPECTOMY; Left 04/23/2017: BREAST LUMPECTOMY; Left     Comment:  Procedure: BREAST LUMPECTOMY WITH EXCISION OF SENTINEL               NODE;  Surgeon: Robert Bellow, MD;  Whole breast               radiation ORS;  Service: General;  Laterality: Left; No date: BREAST SURGERY; Right     Comment:  Wide excision No date: COLONOSCOPY No date: EYE SURGERY     Comment:  cataracts bil 2015 or 2016: FRACTURE SURGERY; Left 02/25/2021: SIMPLE MASTECTOMY WITH AXILLARY SENTINEL NODE BIOPSY; Left     Comment:  Procedure: SIMPLE MASTECTOMY WITH AXILLARY SENTINEL NODE              BIOPSY;  Surgeon: Robert Bellow, MD;  Location: [facility]              ORS;  Service: General;  Laterality: Left; 2001: TUMOR EXCISION     Reproductive/Obstetrics negative OB ROS                            Anesthesia Physical Anesthesia Plan  ASA: II  Anesthesia Plan: General LMA   Post-op Pain Management:    Induction:   PONV Risk Score and Plan: Ondansetron, Treatment may vary due to age  or medical condition, Propofol infusion and TIVA  Airway Management Planned: Simple Face Mask  Additional Equipment:   Intra-op Plan:   Post-operative Plan:   Informed Consent: I have reviewed the patients History and Physical, chart, labs and discussed the procedure including the risks, benefits and alternatives for the proposed anesthesia with the patient or authorized representative who has indicated his/her understanding and acceptance.     Dental Advisory Given  Plan Discussed with: Anesthesiologist, CRNA and Surgeon  Anesthesia Plan Comments:        Anesthesia Quick Evaluation

## 2021-03-27 NOTE — Anesthesia Procedure Notes (Addendum)
Procedure Name: General with mask airway Performed by: Kelton Pillar, CRNA Pre-anesthesia Checklist: Patient identified, Suction available, Patient being monitored and Emergency Drugs available Patient Re-evaluated:Patient Re-evaluated prior to induction Oxygen Delivery Method: Simple face mask Induction Type: IV induction Ventilation: Oral airway inserted - appropriate to patient size Placement Confirmation: positive ETCO2 and CO2 detector Dental Injury: Teeth and Oropharynx as per pre-operative assessment

## 2021-03-27 NOTE — Op Note (Signed)
Preoperative diagnosis: Recurrent seroma postmastectomy, focal skin necrosis.  Postoperative diagnosis: Same.  Operative procedure: Evacuation of seroma, debridement of wound.  Replacement of JP drain, application Prevena wound VAC.  Operating surgeon: Hervey Ard, MD.  Anesthesia: General.  Estimated blood loss: 10 cc.  Clinical note: This 87 year old woman had previously been treated for left breast cancer and developed recurrent disease.  Mastectomy was completed on February 25, 2021.  Initially healing was unremarkable and the drain was removed.  On subsequent visit she had evidence of a seroma which was aspirated on several occasions.  Between her last 2 visits she developed a focal area of necrosis of the wound approximately 4 cm in length and 1 cm width as well as a large seroma.  It was like to bring her to the operating room for debridement.  She had Ancef prior to the procedure and SCD stockings for DVT prevention.  Operative note: The patient underwent general anesthesia and tolerated this well.  The left chest wall was cleansed with Betadine solution and draped.  The area of necrosis was opened and approximately 400 cc of odorless seroma fluid evacuated.  What had appeared to be a hematoma preoperative ultrasound above the central portion of the flap was actually very thickened and inflamed fat.  This was sharply debrided.  The area was then debrided with a gauze sponge and irrigated with saline.  Good hemostasis was noted.  A 15 [place] Blake drain was brought out through the inferior medial flap and anchored into position with a 3-0 nylon suture.  The necrotic skin was debrided sharply and then the wound approximated with a deep layer of running 2-0 Vicryl suture.  The skin was closed with staples.  A Prevena wound dressing was applied and good vacuum seal noted.  The Blake drain was placed to self suction.  Fluff gauze followed by a compressive wrap was applied.  The patient tolerated  the procedure well and was taken to the PACU in stable condition.

## 2021-03-28 ENCOUNTER — Encounter: Payer: Self-pay | Admitting: General Surgery

## 2021-04-01 LAB — AEROBIC/ANAEROBIC CULTURE W GRAM STAIN (SURGICAL/DEEP WOUND)

## 2021-04-03 LAB — SURGICAL PATHOLOGY

## 2021-04-19 NOTE — Progress Notes (Signed)
[place]  [phone] [phone]  ID: Megan Santana OB: 04-Jan-1934  MR#: [id number]  EHU#:[id number]  Patient Care Team: Lynnell Jude, MD as PCP - General (Family Medicine) Rico Junker, RN as Oncology Nurse Navigator Lloyd Huger, MD as Consulting Physician (Oncology) Clemmie Krill Lynnell Jude, MD as Referring Physician (Family Medicine) Bary Castilla Forest Gleason, MD (General Surgery) Noreene Filbert, MD as Referring Physician (Radiation Oncology)   CHIEF COMPLAINT:  CLL, q13 deletion.  DCIS left breast  INTERVAL HISTORY: Patient returns to clinic today for further evaluation and reinitiation of treatment.  Her breast wound has healed and she feels back to her baseline.  She has been holding [place] and has not yet started tamoxifen.  She has no neurologic complaints. She denies any recent fevers or illnesses.  She denies any night sweats or unintentional weight loss.  She has no chest pain, shortness of breath, cough, or hemoptysis.  She denies any nausea, vomiting, constipation, or diarrhea. She has no urinary complaints.  Patient offers no specific complaints today.  REVIEW OF SYSTEMS:   Review of Systems  Constitutional: Negative.  Negative for fever, malaise/fatigue and weight loss.  Respiratory: Negative.  Negative for cough, hemoptysis and shortness of breath.   Cardiovascular: Negative.  Negative for chest pain and leg swelling.  Gastrointestinal: Negative.  Negative for abdominal pain and constipation.  Genitourinary: Negative.  Negative for dysuria and flank pain.  Musculoskeletal: Negative.  Negative for back pain and joint pain.  Skin: Negative.  Negative for rash.  Neurological: Negative.  Negative for sensory change, focal weakness, weakness and headaches.  Psychiatric/Behavioral: Negative.  The patient is not nervous/anxious and does not have insomnia.     As per HPI. Otherwise, a complete review of systems is negative.  PAST  MEDICAL HISTORY: Past Medical History:  Diagnosis Date  . Arthritis   . Breast cancer [facility]) 2007   right lumpectomy  . Breast cancer ([place]) 03/31/2017   16 mm invasive mammary carcinoma, left upper outer quadrant, T1c, N0, triple negative, histologic grade 3.  . Family history of adverse reaction to anesthesia    mom n/v  . GERD (gastroesophageal reflux disease)   . Heart murmur   . Lumbar radiculitis   . Lumbar stenosis   . Personal history of chemotherapy 2018   Left  . Personal history of radiation therapy 2007   Right  . Personal history of radiation therapy 2018   Left    PAST SURGICAL HISTORY: Past Surgical History:  Procedure Laterality Date  . ABDOMINAL HYSTERECTOMY     total  . BACK SURGERY     lumbar  . BREAST BIOPSY Bilateral 03/31/2017   bilat u/s core INVASIVE MAMMARY CARCINOMA  . BREAST BIOPSY Right 03/31/2017   DENSE FIBROSIS AND SHEETS OF MACROPHAGES  . BREAST EXCISIONAL BIOPSY Left 2018  . BREAST LUMPECTOMY Right 2007  . BREAST LUMPECTOMY Left 04/23/2017  . BREAST LUMPECTOMY Left 04/23/2017   Procedure: BREAST LUMPECTOMY WITH EXCISION OF SENTINEL NODE;  Surgeon: Robert Bellow, MD;  Whole breast radiation ORS;  Service: General;  Laterality: Left;  . BREAST SURGERY Right    Wide excision  . COLONOSCOPY    . EYE SURGERY     cataracts bil  . FRACTURE SURGERY Left 2015 or 2016  . INCISION AND DRAINAGE ABSCESS Left 03/27/2021   Procedure: debridement left chest wall and seroma drainage;  Surgeon: Robert Bellow, MD;  Location: [facility] ORS;  Service: General;  Laterality:  Left;  . SIMPLE MASTECTOMY WITH AXILLARY SENTINEL NODE BIOPSY Left 02/25/2021   Procedure: SIMPLE MASTECTOMY WITH AXILLARY SENTINEL NODE BIOPSY;  Surgeon: Robert Bellow, MD;  Location: [facility] ORS;  Service: General;  Laterality: Left;  . TUMOR EXCISION  2001    FAMILY HISTORY: Family History  Problem Relation Age of Onset  . Breast cancer Sister 71  . Lung cancer Mother 12        mets to breast  . Lung cancer Father 81  . Heart attack Brother   . Breast cancer Maternal Aunt        age unknown  . Stroke Sister 56    ADVANCED DIRECTIVES (Y/N):  N  HEALTH MAINTENANCE: Social History   Tobacco Use  . Smoking status: Former Smoker    Packs/day: 0.25    Years: 10.00    Pack years: 2.50    Types: Cigarettes    Quit date: 1970    Years since quitting: 52.3  . Smokeless tobacco: Never Used  Vaping Use  . Vaping Use: Never used  Substance Use Topics  . Alcohol use: Not Currently  . Drug use: No     Colonoscopy:  PAP:  Bone density:  Lipid panel:  Allergies  Allergen Reactions  . Oxycodone Other (See Comments)    Can't sleep, feels bad when taking.    Current Outpatient Medications  Medication Sig Dispense Refill  . acetaminophen (TYLENOL) 500 MG tablet Take 1,000 mg by mouth every 6 (six) hours as needed (for pain.).    Marland Kitchen calcium carbonate (TUMS EX) 750 MG chewable tablet Chew 1-2 tablets by mouth 3 (three) times daily as needed (for heartburn/indigestion).    . Magnesium Oxide 250 MG TABS Take 250 mg by mouth daily.     . Multiple Minerals-Vitamins (CAL MAG ZINC +D3 PO) Take 1 tablet by mouth daily.    . Multiple Vitamins-Minerals (CENTRUM SILVER 50+WOMEN) TABS Take 1 tablet by mouth daily.    . Potassium 99 MG TABS Take 99 mg by mouth daily.     Marland Kitchen ibrutinib 420 MG TABS TAKE 1 TABLET (420 MG) BY MOUTH DAILY. (Patient not taking: Reported on 03/27/2021) 28 tablet 3  . nitrofurantoin, macrocrystal-monohydrate, (MACROBID) 100 MG capsule Take 100 mg by mouth 2 (two) times daily.    . tamoxifen (NOLVADEX) 20 MG tablet Take 1 tablet (20 mg total) by mouth daily. 90 tablet 3   No current facility-administered medications for this visit.    OBJECTIVE: Vitals:   04/25/21 1415  BP: 122/80  Pulse: 92  Temp: 98 F (36.7 C)  SpO2: 98%     Body mass index is 28.09 kg/m.    ECOG FS:0 - Asymptomatic  General: Well-developed, well-nourished, no acute  distress. Eyes: Pink conjunctiva, anicteric sclera. HEENT: Normocephalic, moist mucous membranes. Breasts: Left mastectomy. Lungs: No audible wheezing or coughing. Heart: Regular rate and rhythm. Abdomen: Soft, nontender, no obvious distention. Musculoskeletal: No edema, cyanosis, or clubbing. Neuro: Alert, answering all questions appropriately. Cranial nerves grossly intact. Skin: No rashes or petechiae noted. Psych: Normal affect.  LAB RESULTS:  Lab Results  Component Value Date   NA 138 04/25/2021   K 4.6 04/25/2021   CL 105 04/25/2021   CO2 22 04/25/2021   GLUCOSE 120 (H) 04/25/2021   BUN 20 04/25/2021   CREATININE 1.15 (H) 04/25/2021   CALCIUM 9.1 04/25/2021   PROT 7.0 04/25/2021   ALBUMIN 4.1 04/25/2021   AST 22 04/25/2021   ALT 18 04/25/2021  ALKPHOS 85 04/25/2021   BILITOT 0.4 04/25/2021   GFRNONAA 47 (L) 04/25/2021   GFRAA >60 09/10/2020    Lab Results  Component Value Date   WBC 19.4 (H) 04/25/2021   NEUTROABS 6.0 04/25/2021   HGB 12.6 04/25/2021   HCT 37.7 04/25/2021   MCV 84.7 04/25/2021   PLT 244 04/25/2021     STUDIES: No results found.  ASSESSMENT:  CLL, q13 deletion.  DCIS left breast  PLAN:    1. CLL, 13 deletion.  CT scan results from February 04, 2021 reviewed independently and reported as above with essentially resolution of extensive lymphadenopathy throughout the chest, abdomen, pelvis.  White blood cell count has trended up slightly to 19.4.  Patient initiated 420 mg Imbruvica in October 2021 after a reaction to Rituxan.  Treatment was discontinued temporarily surrounding her surgery.  She has been instructed to reinitiate Imbruvica today.  Patient will have video assisted telemedicine visit in 6 weeks and then follow-up visit with imaging in 3 months.   2.  Left breast DCIS: Patient underwent total mastectomy on February 25, 2021 confirming diagnosis.  She does not require adjuvant XRT.  Patient will initiate tamoxifen today.  Continue  treatment for a total of 5 years completing in May 2027.  Follow-up as above.   3.  History of pathologic stage IB triple negative invasive carcinoma of the upper outer quadrant of the left breast: Patient's previous breast cancer was in her right breast and was ER/PR positive. This was likely a second primary.  Patient completed 4 cycles of Taxotere and Cytoxan August 06, 2017.  Also completed adjuvant XRT.  An aromatase inhibitor would not be of any benefit given the ER/PR status of her tumor.   3. 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 Oncotype score 17: Originally diagnosed in 2008. Patient completed 5 years of Arimidex in approximately August 2013. Biopsy of the right breast only revealed fat necrosis at the site of her previous MammoSite.  Mammogram and possible biopsy as above. 4. Genetic testing: Patient has a sister and mother both with breast cancer. 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 5.  Flank pain: Resolved. 6.  Anxiety: Chronic and unchanged.  Continue Xanax as needed. 7.  Mastectomy wound: Resolved.  Reinitiate Imbruvica as above.  Patient expressed understanding and was in agreement with this plan. She also understands that She can call clinic at any time with any questions, concerns, or complaints.   Cancer Staging CLL (chronic lymphocytic leukemia) ([place]) Staging form: Chronic Lymphocytic Leukemia / Small Lymphocytic Lymphoma, AJCC 8th Edition - Clinical stage from 03/14/2021: Modified Rai Stage I (Modified Rai risk: Intermediate, Lymphocytosis: Present, Adenopathy: Present, Organomegaly: Absent, Anemia: Absent, Thrombocytopenia: Absent) - Signed by Lloyd Huger, MD on 03/14/2021 Stage prefix: Initial diagnosis  Carcinoma of upper-outer quadrant of left breast in female, estrogen receptor negative ([place]) Staging form: Breast, AJCC 8th Edition - Clinical stage  from 04/08/2017: Stage IB (cT1c, cN0, cM0, G3, ER: Negative, PR: Negative, HER2: Negative) - Signed by Lloyd Huger, MD on 04/08/2017 Histologic grading system: 3 grade system Laterality: Right - Pathologic stage from 05/11/2017: Stage IB (pT1c, pN0, cM0, G3, ER: Negative, PR: Negative, HER2: Negative) - Signed by Lloyd Huger, MD on 05/11/2017 Neoadjuvant therapy: No Histologic grading system: 3 grade system Laterality: Left  Ductal carcinoma in situ (DCIS) of left breast Staging form: Breast, AJCC 8th  Edition - Clinical stage from 03/14/2021: Stage 0 (cTis (DCIS), cN0, cM0, ER+, PR+, HER2-) - Signed by Lloyd Huger, MD on 03/14/2021 Nuclear grade: Patrecia Pour, MD   04/25/2021 7:01 PM

## 2021-04-23 ENCOUNTER — Other Ambulatory Visit (HOSPITAL_COMMUNITY): Payer: Self-pay

## 2021-04-25 ENCOUNTER — Encounter: Payer: Self-pay | Admitting: Oncology

## 2021-04-25 ENCOUNTER — Inpatient Hospital Stay: Payer: Medicare HMO | Attending: Oncology

## 2021-04-25 ENCOUNTER — Ambulatory Visit: Payer: Medicare HMO | Admitting: Pharmacist

## 2021-04-25 ENCOUNTER — Inpatient Hospital Stay (HOSPITAL_BASED_OUTPATIENT_CLINIC_OR_DEPARTMENT_OTHER): Payer: Medicare HMO | Admitting: Oncology

## 2021-04-25 ENCOUNTER — Other Ambulatory Visit: Payer: Self-pay

## 2021-04-25 VITALS — BP 122/80 | HR 92 | Temp 98.0°F | Ht 62.0 in | Wt 153.6 lb

## 2021-04-25 DIAGNOSIS — C9110 Chronic lymphocytic leukemia of B-cell type not having achieved remission: Secondary | ICD-10-CM | POA: Diagnosis not present

## 2021-04-25 DIAGNOSIS — Z923 Personal history of irradiation: Secondary | ICD-10-CM | POA: Insufficient documentation

## 2021-04-25 DIAGNOSIS — Z79811 Long term (current) use of aromatase inhibitors: Secondary | ICD-10-CM | POA: Diagnosis not present

## 2021-04-25 DIAGNOSIS — Z170 Estrogen receptor positive status [ER+]: Secondary | ICD-10-CM | POA: Insufficient documentation

## 2021-04-25 DIAGNOSIS — Z9221 Personal history of antineoplastic chemotherapy: Secondary | ICD-10-CM | POA: Diagnosis not present

## 2021-04-25 DIAGNOSIS — Z87891 Personal history of nicotine dependence: Secondary | ICD-10-CM | POA: Diagnosis not present

## 2021-04-25 DIAGNOSIS — C50412 Malignant neoplasm of upper-outer quadrant of left female breast: Secondary | ICD-10-CM | POA: Insufficient documentation

## 2021-04-25 LAB — CBC WITH DIFFERENTIAL/PLATELET
Abs Immature Granulocytes: 0.32 10*3/uL — ABNORMAL HIGH (ref 0.00–0.07)
Basophils Absolute: 0.1 10*3/uL (ref 0.0–0.1)
Basophils Relative: 1 %
Eosinophils Absolute: 0.3 10*3/uL (ref 0.0–0.5)
Eosinophils Relative: 2 %
HCT: 37.7 % (ref 36.0–46.0)
Hemoglobin: 12.6 g/dL (ref 12.0–15.0)
Immature Granulocytes: 2 %
Lymphocytes Relative: 19 %
Lymphs Abs: 3.6 10*3/uL (ref 0.7–4.0)
MCH: 28.3 pg (ref 26.0–34.0)
MCHC: 33.4 g/dL (ref 30.0–36.0)
MCV: 84.7 fL (ref 80.0–100.0)
Monocytes Absolute: 9 10*3/uL — ABNORMAL HIGH (ref 0.1–1.0)
Monocytes Relative: 45 %
Neutro Abs: 6 10*3/uL (ref 1.7–7.7)
Neutrophils Relative %: 31 %
Platelets: 244 10*3/uL (ref 150–400)
RBC: 4.45 MIL/uL (ref 3.87–5.11)
RDW: 14.6 % (ref 11.5–15.5)
Smear Review: NORMAL
WBC: 19.4 10*3/uL — ABNORMAL HIGH (ref 4.0–10.5)
nRBC: 0 % (ref 0.0–0.2)

## 2021-04-25 LAB — COMPREHENSIVE METABOLIC PANEL
ALT: 18 U/L (ref 0–44)
AST: 22 U/L (ref 15–41)
Albumin: 4.1 g/dL (ref 3.5–5.0)
Alkaline Phosphatase: 85 U/L (ref 38–126)
Anion gap: 11 (ref 5–15)
BUN: 20 mg/dL (ref 8–23)
CO2: 22 mmol/L (ref 22–32)
Calcium: 9.1 mg/dL (ref 8.9–10.3)
Chloride: 105 mmol/L (ref 98–111)
Creatinine, Ser: 1.15 mg/dL — ABNORMAL HIGH (ref 0.44–1.00)
GFR, Estimated: 47 mL/min — ABNORMAL LOW (ref >60)
Glucose, Bld: 120 mg/dL — ABNORMAL HIGH (ref 70–99)
Potassium: 4.6 mmol/L (ref 3.5–5.1)
Sodium: 138 mmol/L (ref 135–145)
Total Bilirubin: 0.4 mg/dL (ref 0.3–1.2)
Total Protein: 7 g/dL (ref 6.5–8.1)

## 2021-04-25 LAB — PHOSPHORUS: Phosphorus: 3.8 mg/dL (ref 2.5–4.6)

## 2021-04-25 LAB — MAGNESIUM: Magnesium: 2.1 mg/dL (ref 1.7–2.4)

## 2021-05-21 ENCOUNTER — Other Ambulatory Visit (HOSPITAL_COMMUNITY): Payer: Self-pay

## 2021-05-21 MED FILL — Ibrutinib Tab 420 MG: ORAL | 28 days supply | Qty: 28 | Fill #0 | Status: CN

## 2021-05-21 MED FILL — Ibrutinib Tab 420 MG: ORAL | 28 days supply | Qty: 28 | Fill #0 | Status: AC

## 2021-05-29 ENCOUNTER — Other Ambulatory Visit (HOSPITAL_COMMUNITY): Payer: Self-pay

## 2021-06-08 NOTE — Progress Notes (Signed)
[name]  [phone]) [phone] Fax:(336) [phone]  ID: Megan Santana OB: 12-23-1933  MR#: [id number]  FBP#:[id number]  Patient Care Team: Lynnell Jude, MD as PCP - General (Family Medicine) Rico Junker, RN as Oncology Nurse Navigator Grayland Ormond, Kathlene November, MD as Consulting Physician (Oncology) Clemmie Krill Lynnell Jude, MD as Referring Physician (Family Medicine) Bary Castilla Forest Gleason, MD (General Surgery) Noreene Filbert, MD as Referring Physician (Radiation Oncology)  I connected with Megan Santana on 06/13/21 at  2:15 PM EDT by video enabled telemedicine visit and verified that I am speaking with the correct person using two identifiers.   I discussed the limitations, risks, security and privacy concerns of performing an evaluation and management service by telemedicine and the availability of in-person appointments. I also discussed with the patient that there may be a patient responsible charge related to this service. The patient expressed understanding and agreed to proceed.   Other persons participating in the visit and their role in the encounter: Patient, MD.  Patient's location: Home. Provider's location: Clinic.  CHIEF COMPLAINT:  CLL, q13 deletion.  DCIS left breast  INTERVAL HISTORY: Patient agreed to video assisted telemedicine visit for further evaluation and to assess her toleration of Imbruvica and tamoxifen.  She currently feels well and is asymptomatic.  She is tolerating her treatments without significant side effects. She has no neurologic complaints. She denies any recent fevers or illnesses.  She denies any night sweats or unintentional weight loss.  She has no chest pain, shortness of breath, cough, or hemoptysis.  She denies any nausea, vomiting, constipation, or diarrhea. She has no urinary complaints.  Patient feels at her baseline offers no specific complaints today.  REVIEW OF SYSTEMS:   Review of Systems  Constitutional: Negative.   Negative for fever, malaise/fatigue and weight loss.  Respiratory: Negative.  Negative for cough, hemoptysis and shortness of breath.   Cardiovascular: Negative.  Negative for chest pain and leg swelling.  Gastrointestinal: Negative.  Negative for abdominal pain and constipation.  Genitourinary: Negative.  Negative for dysuria and flank pain.  Musculoskeletal: Negative.  Negative for back pain and joint pain.  Skin: Negative.  Negative for rash.  Neurological: Negative.  Negative for sensory change, focal weakness, weakness and headaches.  Psychiatric/Behavioral: Negative.  The patient is not nervous/anxious and does not have insomnia.    As per HPI. Otherwise, a complete review of systems is negative.  PAST MEDICAL HISTORY: Past Medical History:  Diagnosis Date   Arthritis    Breast cancer ([place]) 2007   right lumpectomy   Breast cancer ([place]) 03/31/2017   16 mm invasive mammary carcinoma, left upper outer quadrant, T1c, N0, triple negative, histologic grade 3.   Family history of adverse reaction to anesthesia    mom n/v   GERD (gastroesophageal reflux disease)    Heart murmur    Lumbar radiculitis    Lumbar stenosis    Personal history of chemotherapy 2018   Left   Personal history of radiation therapy 2007   Right   Personal history of radiation therapy 2018   Left    PAST SURGICAL HISTORY: Past Surgical History:  Procedure Laterality Date   ABDOMINAL HYSTERECTOMY     total   BACK SURGERY     lumbar   BREAST BIOPSY Bilateral 03/31/2017   bilat u/s core INVASIVE MAMMARY CARCINOMA   BREAST BIOPSY Right 03/31/2017   DENSE FIBROSIS AND SHEETS OF MACROPHAGES   BREAST EXCISIONAL BIOPSY Left 2018   BREAST  LUMPECTOMY Right 2007   BREAST LUMPECTOMY Left 04/23/2017   BREAST LUMPECTOMY Left 04/23/2017   Procedure: BREAST LUMPECTOMY WITH EXCISION OF SENTINEL NODE;  Surgeon: Robert Bellow, MD;  Whole breast radiation ORS;  Service: General;  Laterality: Left;   BREAST  SURGERY Right    Wide excision   COLONOSCOPY     EYE SURGERY     cataracts bil   FRACTURE SURGERY Left 2015 or 2016   INCISION AND DRAINAGE ABSCESS Left 03/27/2021   Procedure: debridement left chest wall and seroma drainage;  Surgeon: Robert Bellow, MD;  Location: [facility] ORS;  Service: General;  Laterality: Left;   SIMPLE MASTECTOMY WITH AXILLARY SENTINEL NODE BIOPSY Left 02/25/2021   Procedure: SIMPLE MASTECTOMY WITH AXILLARY SENTINEL NODE BIOPSY;  Surgeon: Robert Bellow, MD;  Location: [facility] ORS;  Service: General;  Laterality: Left;   TUMOR EXCISION  2001    FAMILY HISTORY: Family History  Problem Relation Age of Onset   Breast cancer Sister 12   Lung cancer Mother 46       mets to breast   Lung cancer Father 43   Heart attack Brother    Breast cancer Maternal Aunt        age unknown   Stroke Sister 67    ADVANCED DIRECTIVES (Y/N):  N  HEALTH MAINTENANCE: Social History   Tobacco Use   Smoking status: Former    Packs/day: 0.25    Years: 10.00    Pack years: 2.50    Types: Cigarettes    Quit date: 1970    Years since quitting: 52.5   Smokeless tobacco: Never  Vaping Use   Vaping Use: Never used  Substance Use Topics   Alcohol use: Not Currently   Drug use: No     Colonoscopy:  PAP:  Bone density:  Lipid panel:  Allergies  Allergen Reactions   Oxycodone Other (See Comments)    Can't sleep, feels bad when taking.    Current Outpatient Medications  Medication Sig Dispense Refill   acetaminophen (TYLENOL) 500 MG tablet Take 1,000 mg by mouth every 6 (six) hours as needed (for pain.).     calcium carbonate (TUMS EX) 750 MG chewable tablet Chew 1-2 tablets by mouth 3 (three) times daily as needed (for heartburn/indigestion).     ibrutinib 420 MG TABS TAKE 1 TABLET (420 MG) BY MOUTH DAILY. 28 tablet 3   Magnesium Oxide 250 MG TABS Take 250 mg by mouth daily.      Multiple Minerals-Vitamins (CAL MAG ZINC +D3 PO) Take 1 tablet by mouth daily.      Multiple Vitamins-Minerals (CENTRUM SILVER 50+WOMEN) TABS Take 1 tablet by mouth daily.     Potassium 99 MG TABS Take 99 mg by mouth daily.      tamoxifen (NOLVADEX) 20 MG tablet Take 1 tablet (20 mg total) by mouth daily. 90 tablet 3   No current facility-administered medications for this visit.    OBJECTIVE: There were no vitals filed for this visit.    There is no height or weight on file to calculate BMI.    ECOG FS:0 - Asymptomatic  General: Well-developed, well-nourished, no acute distress. HEENT: Normocephalic. Neuro: Alert, answering all questions appropriately. Cranial nerves grossly intact. Psych: Normal affect.   LAB RESULTS:  Lab Results  Component Value Date   NA 137 06/10/2021   K 3.9 06/10/2021   CL 106 06/10/2021   CO2 24 06/10/2021   GLUCOSE 113 (H) 06/10/2021   BUN  14 06/10/2021   CREATININE 0.90 06/10/2021   CALCIUM 8.7 (L) 06/10/2021   PROT 5.9 (L) 06/10/2021   ALBUMIN 3.6 06/10/2021   AST 21 06/10/2021   ALT 16 06/10/2021   ALKPHOS 43 06/10/2021   BILITOT 0.6 06/10/2021   GFRNONAA >60 06/10/2021   GFRAA >60 09/10/2020    Lab Results  Component Value Date   WBC 8.4 06/10/2021   NEUTROABS 3.3 06/10/2021   HGB 10.7 (L) 06/10/2021   HCT 33.0 (L) 06/10/2021   MCV 89.7 06/10/2021   PLT 134 (L) 06/10/2021     STUDIES: No results found.  ASSESSMENT:  CLL, q13 deletion.  DCIS left breast  PLAN:    1. CLL, 13 deletion.  CT scan results from February 04, 2021 reviewed independently with essential resolution of extensive lymphadenopathy throughout the chest, abdomen, pelvis. Patient initiated 420 mg Imbruvica in October 2021 after a reaction to Rituxan.  Treatment was discontinued temporarily surrounding her breast surgery.  Patient is now back on Imbruvica and her white count has returned to normal.  Return to clinic in 3 months with repeat imaging and laboratory work and further evaluation.  2.  Left breast DCIS: Patient underwent total mastectomy  on February 25, 2021 confirming diagnosis.  She does not require adjuvant XRT.  Continue tamoxifen completing 5 years of treatment in May 2027.  Follow-up as above.   3.  History of pathologic stage IB triple negative invasive carcinoma of the upper outer quadrant of the left breast: Patient's previous breast cancer was in her right breast and was ER/PR positive. This was likely a second primary.  Patient completed 4 cycles of Taxotere and Cytoxan August 06, 2017.  Also completed adjuvant XRT.  An aromatase inhibitor would not be of any benefit given the ER/PR status of her tumor.   3. 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 Oncotype score 17: Originally diagnosed in 2008. Patient completed 5 years of Arimidex in approximately August 2013. Biopsy of the right breast only revealed fat necrosis at the site of her previous MammoSite.  Mammogram and possible biopsy as above. 4. Genetic testing: Patient has a sister and mother both with breast cancer. 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 5.  Anxiety: Chronic and unchanged.  Continue Xanax as needed. 6.  Anemia: Mild, monitor.  Patient's hemoglobin has decreased slightly to 10.7. 7.  Thrombocytopenia: Mild, monitor.  Patient's platelet count is 134 today.  I provided 20 minutes of face-to-face video visit time during this encounter which included chart review, counseling, and coordination of care as documented above.   Patient expressed understanding and was in agreement with this plan. She also understands that She can call clinic at any time with any questions, concerns, or complaints.   Cancer Staging CLL (chronic lymphocytic leukemia) ([place]) Staging form: Chronic Lymphocytic Leukemia / Small Lymphocytic Lymphoma, AJCC 8th Edition - Clinical stage from 03/14/2021: Modified Rai Stage I (Modified Rai risk: Intermediate, Lymphocytosis: Present,  Adenopathy: Present, Organomegaly: Absent, Anemia: Absent, Thrombocytopenia: Absent) - Signed by Lloyd Huger, MD on 03/14/2021 Stage prefix: Initial diagnosis  Carcinoma of upper-outer quadrant of left breast in female, estrogen receptor negative ([place]) Staging form: Breast, AJCC 8th Edition - Clinical stage from 04/08/2017: Stage IB (cT1c, cN0, cM0, G3, ER: Negative, PR: Negative, HER2: Negative) - Signed by Lloyd Huger, MD on 04/08/2017 Histologic grading system: 3 grade system Laterality: Right -  Pathologic stage from 05/11/2017: Stage IB (pT1c, pN0, cM0, G3, ER: Negative, PR: Negative, HER2: Negative) - Signed by Lloyd Huger, MD on 05/11/2017 Neoadjuvant therapy: No Histologic grading system: 3 grade system Laterality: Left  Ductal carcinoma in situ (DCIS) of left breast Staging form: Breast, AJCC 8th Edition - Clinical stage from 03/14/2021: Stage 0 (cTis (DCIS), cN0, cM0, ER+, PR+, HER2-) - Signed by Lloyd Huger, MD on 03/14/2021 Nuclear grade: Patrecia Pour, MD   06/13/2021 4:52 PM

## 2021-06-10 ENCOUNTER — Inpatient Hospital Stay: Payer: Medicare HMO | Attending: Oncology

## 2021-06-10 DIAGNOSIS — D649 Anemia, unspecified: Secondary | ICD-10-CM | POA: Diagnosis not present

## 2021-06-10 DIAGNOSIS — Z86000 Personal history of in-situ neoplasm of breast: Secondary | ICD-10-CM | POA: Insufficient documentation

## 2021-06-10 DIAGNOSIS — Z9221 Personal history of antineoplastic chemotherapy: Secondary | ICD-10-CM | POA: Diagnosis not present

## 2021-06-10 DIAGNOSIS — Z79899 Other long term (current) drug therapy: Secondary | ICD-10-CM | POA: Insufficient documentation

## 2021-06-10 DIAGNOSIS — Z923 Personal history of irradiation: Secondary | ICD-10-CM | POA: Insufficient documentation

## 2021-06-10 DIAGNOSIS — C9110 Chronic lymphocytic leukemia of B-cell type not having achieved remission: Secondary | ICD-10-CM | POA: Insufficient documentation

## 2021-06-10 DIAGNOSIS — Z853 Personal history of malignant neoplasm of breast: Secondary | ICD-10-CM | POA: Diagnosis not present

## 2021-06-10 LAB — CBC WITH DIFFERENTIAL/PLATELET
Abs Immature Granulocytes: 0.05 10*3/uL (ref 0.00–0.07)
Basophils Absolute: 0.1 10*3/uL (ref 0.0–0.1)
Basophils Relative: 1 %
Eosinophils Absolute: 0.3 10*3/uL (ref 0.0–0.5)
Eosinophils Relative: 3 %
HCT: 33 % — ABNORMAL LOW (ref 36.0–46.0)
Hemoglobin: 10.7 g/dL — ABNORMAL LOW (ref 12.0–15.0)
Immature Granulocytes: 1 %
Lymphocytes Relative: 51 %
Lymphs Abs: 4.3 10*3/uL — ABNORMAL HIGH (ref 0.7–4.0)
MCH: 29.1 pg (ref 26.0–34.0)
MCHC: 32.4 g/dL (ref 30.0–36.0)
MCV: 89.7 fL (ref 80.0–100.0)
Monocytes Absolute: 0.4 10*3/uL (ref 0.1–1.0)
Monocytes Relative: 5 %
Neutro Abs: 3.3 10*3/uL (ref 1.7–7.7)
Neutrophils Relative %: 39 %
Platelets: 134 10*3/uL — ABNORMAL LOW (ref 150–400)
RBC: 3.68 MIL/uL — ABNORMAL LOW (ref 3.87–5.11)
RDW: 14.6 % (ref 11.5–15.5)
WBC: 8.4 10*3/uL (ref 4.0–10.5)
nRBC: 0 % (ref 0.0–0.2)

## 2021-06-10 LAB — PHOSPHORUS: Phosphorus: 3.2 mg/dL (ref 2.5–4.6)

## 2021-06-10 LAB — COMPREHENSIVE METABOLIC PANEL
ALT: 16 U/L (ref 0–44)
AST: 21 U/L (ref 15–41)
Albumin: 3.6 g/dL (ref 3.5–5.0)
Alkaline Phosphatase: 43 U/L (ref 38–126)
Anion gap: 7 (ref 5–15)
BUN: 14 mg/dL (ref 8–23)
CO2: 24 mmol/L (ref 22–32)
Calcium: 8.7 mg/dL — ABNORMAL LOW (ref 8.9–10.3)
Chloride: 106 mmol/L (ref 98–111)
Creatinine, Ser: 0.9 mg/dL (ref 0.44–1.00)
GFR, Estimated: >60 mL/min (ref >60)
Glucose, Bld: 113 mg/dL — ABNORMAL HIGH (ref 70–99)
Potassium: 3.9 mmol/L (ref 3.5–5.1)
Sodium: 137 mmol/L (ref 135–145)
Total Bilirubin: 0.6 mg/dL (ref 0.3–1.2)
Total Protein: 5.9 g/dL — ABNORMAL LOW (ref 6.5–8.1)

## 2021-06-10 LAB — MAGNESIUM: Magnesium: 1.9 mg/dL (ref 1.7–2.4)

## 2021-06-11 ENCOUNTER — Inpatient Hospital Stay (HOSPITAL_BASED_OUTPATIENT_CLINIC_OR_DEPARTMENT_OTHER): Payer: Medicare HMO | Admitting: Oncology

## 2021-06-11 ENCOUNTER — Encounter: Payer: Self-pay | Admitting: Oncology

## 2021-06-11 DIAGNOSIS — C9110 Chronic lymphocytic leukemia of B-cell type not having achieved remission: Secondary | ICD-10-CM

## 2021-06-19 ENCOUNTER — Other Ambulatory Visit (HOSPITAL_COMMUNITY): Payer: Self-pay

## 2021-06-19 MED FILL — Ibrutinib Tab 420 MG: ORAL | 28 days supply | Qty: 28 | Fill #1 | Status: AC

## 2021-06-26 ENCOUNTER — Other Ambulatory Visit (HOSPITAL_COMMUNITY): Payer: Self-pay

## 2021-07-22 ENCOUNTER — Telehealth: Payer: Self-pay

## 2021-07-22 ENCOUNTER — Other Ambulatory Visit (HOSPITAL_COMMUNITY): Payer: Self-pay

## 2021-07-23 ENCOUNTER — Other Ambulatory Visit (HOSPITAL_COMMUNITY): Payer: Self-pay

## 2021-07-23 MED FILL — Ibrutinib Tab 420 MG: ORAL | 28 days supply | Qty: 28 | Fill #2 | Status: AC

## 2021-07-24 ENCOUNTER — Other Ambulatory Visit (HOSPITAL_COMMUNITY): Payer: Self-pay

## 2021-07-25 ENCOUNTER — Ambulatory Visit: Payer: Medicare HMO

## 2021-07-25 ENCOUNTER — Other Ambulatory Visit (HOSPITAL_COMMUNITY): Payer: Self-pay

## 2021-07-27 NOTE — Progress Notes (Signed)
[place]  [phone] [phone]  ID: Megan Santana OB: [birth year]  MR#: [id number]  YQI#:[id number]  Patient Care Team: Lynnell Jude, MD as PCP - General (Family Medicine) Rico Junker, RN as Oncology Nurse Navigator Lloyd Huger, MD as Consulting Physician (Oncology) Clemmie Krill Lynnell Jude, MD as Referring Physician (Family Medicine) Bary Castilla, Forest Gleason, MD (General Surgery) Noreene Filbert, MD as Referring Physician (Radiation Oncology)   CHIEF COMPLAINT:  CLL, q13 deletion.  DCIS left breast  INTERVAL HISTORY: Patient returns to clinic today for repeat laboratory work and further evaluation.  She recently has been having issues with a "enlarged clitoris", and urinary retention.  These are causing a significant amount of discomfort, but have improved recently.  She otherwise feels well.  She is tolerating Imbruvica and tamoxifen without significant side effects. She has no neurologic complaints. She denies any recent fevers or illnesses.  She denies any night sweats or unintentional weight loss.  She has no chest pain, shortness of breath, cough, or hemoptysis.  She denies any nausea, vomiting, constipation, or diarrhea. She has no urinary complaints.  Patient offers no further specific complaints today.    REVIEW OF SYSTEMS:   Review of Systems  Constitutional: Negative.  Negative for fever, malaise/fatigue and weight loss.  Respiratory: Negative.  Negative for cough, hemoptysis and shortness of breath.   Cardiovascular: Negative.  Negative for chest pain and leg swelling.  Gastrointestinal: Negative.  Negative for abdominal pain and constipation.  Genitourinary: Negative.  Negative for dysuria and flank pain.  Musculoskeletal: Negative.  Negative for back pain and joint pain.  Skin: Negative.  Negative for rash.  Neurological: Negative.  Negative for sensory change, focal weakness, weakness and headaches.  Psychiatric/Behavioral:  Negative.  The patient is not nervous/anxious and does not have insomnia.    As per HPI. Otherwise, a complete review of systems is negative.  PAST MEDICAL HISTORY: Past Medical History:  Diagnosis Date   Arthritis    Breast cancer ([place]) 2007   right lumpectomy   Breast cancer ([place]) 03/31/2017   16 mm invasive mammary carcinoma, left upper outer quadrant, T1c, N0, triple negative, histologic grade 3.   Family history of adverse reaction to anesthesia    mom n/v   GERD (gastroesophageal reflux disease)    Heart murmur    Lumbar radiculitis    Lumbar stenosis    Personal history of chemotherapy 2018   Left   Personal history of radiation therapy 2007   Right   Personal history of radiation therapy 2018   Left    PAST SURGICAL HISTORY: Past Surgical History:  Procedure Laterality Date   ABDOMINAL HYSTERECTOMY     total   BACK SURGERY     lumbar   BREAST BIOPSY Bilateral 03/31/2017   bilat u/s core INVASIVE MAMMARY CARCINOMA   BREAST BIOPSY Right 03/31/2017   DENSE FIBROSIS AND SHEETS OF MACROPHAGES   BREAST EXCISIONAL BIOPSY Left 2018   BREAST LUMPECTOMY Right 2007   BREAST LUMPECTOMY Left 04/23/2017   BREAST LUMPECTOMY Left 04/23/2017   Procedure: BREAST LUMPECTOMY WITH EXCISION OF SENTINEL NODE;  Surgeon: Robert Bellow, MD;  Whole breast radiation ORS;  Service: General;  Laterality: Left;   BREAST SURGERY Right    Wide excision   COLONOSCOPY     EYE SURGERY     cataracts bil   FRACTURE SURGERY Left 2015 or 2016   INCISION AND DRAINAGE ABSCESS Left 03/27/2021   Procedure: debridement left chest  wall and seroma drainage;  Surgeon: Robert Bellow, MD;  Location: [facility] ORS;  Service: General;  Laterality: Left;   SIMPLE MASTECTOMY WITH AXILLARY SENTINEL NODE BIOPSY Left 02/25/2021   Procedure: SIMPLE MASTECTOMY WITH AXILLARY SENTINEL NODE BIOPSY;  Surgeon: Robert Bellow, MD;  Location: [facility] ORS;  Service: General;  Laterality: Left;   TUMOR EXCISION  2001     FAMILY HISTORY: Family History  Problem Relation Age of Onset   Breast cancer Sister 73   Lung cancer Mother 79       mets to breast   Lung cancer Father 69   Heart attack Brother    Breast cancer Maternal Aunt        age unknown   Stroke Sister 95    ADVANCED DIRECTIVES (Y/N):  N  HEALTH MAINTENANCE: Social History   Tobacco Use   Smoking status: Former    Packs/day: 0.25    Years: 10.00    Pack years: 2.50    Types: Cigarettes    Quit date: 1970    Years since quitting: 52.6   Smokeless tobacco: Never  Vaping Use   Vaping Use: Never used  Substance Use Topics   Alcohol use: Not Currently   Drug use: No     Colonoscopy:  PAP:  Bone density:  Lipid panel:  Allergies  Allergen Reactions   Oxycodone Other (See Comments)    Can't sleep, feels bad when taking.    Current Outpatient Medications  Medication Sig Dispense Refill   acetaminophen (TYLENOL) 500 MG tablet Take 1,000 mg by mouth every 6 (six) hours as needed (for pain.).     albuterol (VENTOLIN HFA) 108 (90 Base) MCG/ACT inhaler Inhale 2 puffs into the lungs every 4 (four) hours as needed.     amoxicillin-clavulanate (AUGMENTIN) 875-125 MG tablet SMARTSIG:1 Tablet(s) By Mouth Every 12 Hours     calcium carbonate (TUMS EX) 750 MG chewable tablet Chew 1-2 tablets by mouth 3 (three) times daily as needed (for heartburn/indigestion).     ibrutinib 420 MG TABS TAKE 1 TABLET (420 MG) BY MOUTH DAILY. 28 tablet 3   Magnesium Oxide 250 MG TABS Take 250 mg by mouth daily.      Multiple Minerals-Vitamins (CAL MAG ZINC +D3 PO) Take 1 tablet by mouth daily.     Multiple Vitamins-Minerals (CENTRUM SILVER 50+WOMEN) TABS Take 1 tablet by mouth daily.     oxybutynin (DITROPAN) 5 MG tablet Take 5 mg by mouth 3 (three) times daily.     phenazopyridine (PYRIDIUM) 200 MG tablet Take 200 mg by mouth 3 (three) times daily as needed.     Potassium 99 MG TABS Take 99 mg by mouth daily.      tamoxifen (NOLVADEX) 20 MG tablet  Take 1 tablet (20 mg total) by mouth daily. 90 tablet 3   No current facility-administered medications for this visit.    OBJECTIVE: Vitals:   07/30/21 1309  BP: 131/65  Pulse: 67  Resp: 18  Temp: 98 F (36.7 C)  SpO2: 99%      Body mass index is 28.35 kg/m.    ECOG FS:0 - Asymptomatic  General: Well-developed, well-nourished, no acute distress. Eyes: Pink conjunctiva, anicteric sclera. HEENT: Normocephalic, moist mucous membranes. Lungs: No audible wheezing or coughing. Heart: Regular rate and rhythm. Abdomen: Soft, nontender, no obvious distention. Musculoskeletal: No edema, cyanosis, or clubbing. Neuro: Alert, answering all questions appropriately. Cranial nerves grossly intact. Skin: No rashes or petechiae noted. Psych: Normal affect.  LAB RESULTS:  Lab Results  Component Value Date   NA 138 07/30/2021   K 4.2 07/30/2021   CL 105 07/30/2021   CO2 24 07/30/2021   GLUCOSE 95 07/30/2021   BUN 17 07/30/2021   CREATININE 1.00 07/30/2021   CALCIUM 9.3 07/30/2021   PROT 6.4 (L) 07/30/2021   ALBUMIN 3.8 07/30/2021   AST 29 07/30/2021   ALT 19 07/30/2021   ALKPHOS 41 07/30/2021   BILITOT 0.6 07/30/2021   GFRNONAA 56 (L) 07/30/2021   GFRAA >60 09/10/2020    Lab Results  Component Value Date   WBC 10.6 (H) 07/30/2021   NEUTROABS 3.2 07/30/2021   HGB 11.5 (L) 07/30/2021   HCT 34.5 (L) 07/30/2021   MCV 89.8 07/30/2021   PLT 134 (L) 07/30/2021     STUDIES: CT CHEST ABDOMEN PELVIS W CONTRAST  Result Date: 07/29/2021 CLINICAL DATA:  History of CLL, currently on trial drug therapy and chemo, restaging/follow-up scan EXAM: CT CHEST, ABDOMEN, AND PELVIS WITH CONTRAST TECHNIQUE: Multidetector CT imaging of the chest, abdomen and pelvis was performed following the standard protocol during bolus administration of intravenous contrast. CONTRAST:  9m OMNIPAQUE IOHEXOL 350 MG/ML SOLN COMPARISON:  Multiple priors including CT February 04, 2021 FINDINGS: CT CHEST  FINDINGS Cardiovascular: Aortic and branch vessel atherosclerosis without aneurysmal dilation. No central pulmonary embolus. Calcifications of the aortic valve and mitral annulus. Coronary artery calcifications. Normal size heart. No significant pericardial effusion/thickening. No suspicious intracardiac filling defect. Mediastinum/Nodes: No pathologically enlarged supraclavicular, mediastinal, hilar or axillary lymph nodes. Previously indexed high right paratracheal lymph node measures 6 mm on image 10/2, previously 7 mm. Previously indexed right paratracheal lymph node measures 7 mm on image 18/2 previously 10 mm. No suspicious thyroid nodularity. Small hiatal hernia. Lungs/Pleura: Similar biapical pleuroparenchymal scarring. Bibasilar atelectasis. No suspicious pulmonary nodules or masses. No pleural effusion. No pneumothorax. Musculoskeletal: Postsurgical change in the bilateral breasts with similar appearance of the chronic fluid collection in the lateral right breast which demonstrates peripheral calcifications and measures up to 3.7 cm. No enlarging breast masses or suspicious chest wall lesions. Thoracic spondylosis. No aggressive lytic or blastic lesion of bone. Similar appearance of the mild superior endplate compression deformity at T11. CT ABDOMEN PELVIS FINDINGS Hepatobiliary: No suspicious hepatic lesion. Gallbladder is unremarkable. No biliary ductal dilation. Pancreas: Unremarkable. No pancreatic ductal dilatation or surrounding inflammatory changes. Spleen: No splenomegaly.  No suspicious splenic lesions. Adrenals/Urinary Tract: Bilateral adrenal glands are unremarkable. No hydronephrosis. Bilateral tiny renal cortical and sinus cysts are stable. No solid enhancing renal masses. Urinary bladder is unremarkable. Stomach/Bowel: Radiopaque enteric contrast traverses the rectum. Small Richter type ventral hernia containing a nonobstructed portion of small bowel on image 77/2. No pathologic dilation of  small or large bowel and no evidence of acute inflammation. Vascular/Lymphatic: Aortic and branch vessel atherosclerosis without abdominal aortic aneurysm. No pathologically enlarged abdominal or pelvic lymph nodes. Previously indexed small portacaval lymph node measures 7 mm on image 61/2 previously 9 mm. Reproductive: Status post hysterectomy. No adnexal masses. Other: No abdominopelvic ascites. Similar pelvic floor laxity with a moderate cystocele and possible rectocele. Musculoskeletal: No aggressive lytic or blastic lesion of bone. S shaped thoracolumbar curvature with mild spondylosis. Moderate left-sided foraminal narrowing is present at L3 and L4. Degenerative changes bilateral hips and SI joints. IMPRESSION: 1. Stable examination without evidence of new or progressive pathologically enlarged lymph nodes in the chest, abdomen, or pelvis. No splenomegaly. 2. Postsurgical changes in the bilateral breasts without specific findings of recurrence or  metastatic breast cancer. 3. Small Richter type ventral hernia containing a nonobstructed portion of small bowel. 4.  Aortic Atherosclerosis (ICD10-I70.0). Electronically Signed   By: Dahlia Bailiff M.D.   On: 07/29/2021 13:33    ASSESSMENT:  CLL, q13 deletion.  DCIS left breast  PLAN:    1. CLL, q13 deletion: CT scan results from February 04, 2021 reviewed independently with essential resolution of extensive lymphadenopathy throughout the chest, abdomen, pelvis. Patient initiated 420 mg Imbruvica in October 2021 after a reaction to Rituxan.  Treatment was discontinued temporarily surrounding her breast surgery.  Patient is now back on Imbruvica and her white count is now essentially within normal limits.  Repeat imaging on July 29, 2021 reviewed independently and reported as above significant improvement of patient's lymphadenopathy in her chest, abdomen, and pelvis.  She has no splenomegaly.  Continue Imbruvica as prescribed.  Return to clinic in 3 months  with repeat laboratory work and further evaluation.  Appreciate clinical pharmacy input.  2.  Left breast DCIS: Patient underwent total mastectomy on February 25, 2021 confirming diagnosis.  She does not require adjuvant XRT.  Continue tamoxifen for total 5 years completing treatment in May 2027.  3.  History of pathologic stage IB triple negative invasive carcinoma of the upper outer quadrant of the left breast: Patient's previous breast cancer was in her right breast and was ER/PR positive. This was likely a second primary.  Patient completed 4 cycles of Taxotere and Cytoxan August 06, 2017.  Also completed adjuvant XRT.  An aromatase inhibitor would not be of any benefit given the ER/PR status of her tumor.   3. 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 Oncotype score 17: Originally diagnosed in 2008. Patient completed 5 years of Arimidex in approximately August 2013. Biopsy of the right breast only revealed fat necrosis at the site of her previous MammoSite.  Mammogram and possible biopsy as above. 4. Genetic testing: Patient has a sister and mother both with breast cancer. 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 5.  Anxiety: Chronic and unchanged.  Continue Xanax as needed. 6.  Anemia: Improving.  Patient's hemoglobin is 11.5 today. 7.  Thrombocytopenia: Chronic and unchanged.  Patient's platelets are stable at 134. 8.  Urinary retention: Patient reports requiring catheterization x2.  Have recommended urology referral, but patient has declined. 9.  "Enlarged clitoris": Recommended gynecology evaluation which patient has also declined.  Continue follow-up with primary care as scheduled.   Patient expressed understanding and was in agreement with this plan. She also understands that She can call clinic at any time with any questions, concerns, or complaints.   Cancer Staging CLL (chronic  lymphocytic leukemia) ([place]) Staging form: Chronic Lymphocytic Leukemia / Small Lymphocytic Lymphoma, AJCC 8th Edition - Clinical stage from 03/14/2021: Modified Rai Stage I (Modified Rai risk: Intermediate, Lymphocytosis: Present, Adenopathy: Present, Organomegaly: Absent, Anemia: Absent, Thrombocytopenia: Absent) - Signed by Lloyd Huger, MD on 03/14/2021 Stage prefix: Initial diagnosis  Carcinoma of upper-outer quadrant of left breast in female, estrogen receptor negative ([place]) Staging form: Breast, AJCC 8th Edition - Clinical stage from 04/08/2017: Stage IB (cT1c, cN0, cM0, G3, ER-, PR-, HER2-) - Signed by Lloyd Huger, MD on 04/08/2017 Histologic grading system: 3 grade system Laterality: Right - Pathologic stage from 05/11/2017: Stage IB (pT1c, pN0, cM0, G3, ER-, PR-, HER2-) - Signed by Lloyd Huger, MD on 05/11/2017 Neoadjuvant therapy:  No Histologic grading system: 3 grade system Laterality: Left  Ductal carcinoma in situ (DCIS) of left breast Staging form: Breast, AJCC 8th Edition - Clinical stage from 03/14/2021: Stage 0 (cTis (DCIS), cN0, cM0, ER+, PR+, HER2-) - Signed by Lloyd Huger, MD on 03/14/2021 Nuclear grade: Patrecia Pour, MD   07/31/2021 6:29 AM

## 2021-07-29 ENCOUNTER — Other Ambulatory Visit: Payer: Self-pay

## 2021-07-29 ENCOUNTER — Ambulatory Visit
Admission: RE | Admit: 2021-07-29 | Discharge: 2021-07-29 | Disposition: A | Discharge status: Home / Self Care | Payer: Medicare HMO | Source: Ambulatory Visit | Attending: Oncology | Admitting: Oncology

## 2021-07-29 DIAGNOSIS — C9110 Chronic lymphocytic leukemia of B-cell type not having achieved remission: Secondary | ICD-10-CM | POA: Insufficient documentation

## 2021-07-29 LAB — POCT I-STAT CREATININE: Creatinine, Ser: 1 mg/dL (ref 0.44–1.00)

## 2021-07-29 MED ORDER — IOHEXOL 350 MG/ML SOLN
85.0000 mL | Freq: Once | INTRAVENOUS | Status: AC | PRN
  Administered 2021-07-29: 85 mL via INTRAVENOUS

## 2021-07-30 ENCOUNTER — Inpatient Hospital Stay: Payer: Medicare HMO | Attending: Oncology

## 2021-07-30 ENCOUNTER — Inpatient Hospital Stay: Payer: Medicare HMO | Admitting: Oncology

## 2021-07-30 VITALS — BP 131/65 | HR 67 | Temp 98.0°F | Resp 18 | Wt 155.0 lb

## 2021-07-30 DIAGNOSIS — C50911 Malignant neoplasm of unspecified site of right female breast: Secondary | ICD-10-CM | POA: Diagnosis not present

## 2021-07-30 DIAGNOSIS — Z79899 Other long term (current) drug therapy: Secondary | ICD-10-CM | POA: Insufficient documentation

## 2021-07-30 DIAGNOSIS — D649 Anemia, unspecified: Secondary | ICD-10-CM | POA: Diagnosis not present

## 2021-07-30 DIAGNOSIS — C9110 Chronic lymphocytic leukemia of B-cell type not having achieved remission: Secondary | ICD-10-CM

## 2021-07-30 DIAGNOSIS — Z171 Estrogen receptor negative status [ER-]: Secondary | ICD-10-CM | POA: Diagnosis not present

## 2021-07-30 DIAGNOSIS — C50412 Malignant neoplasm of upper-outer quadrant of left female breast: Secondary | ICD-10-CM | POA: Insufficient documentation

## 2021-07-30 DIAGNOSIS — Z79811 Long term (current) use of aromatase inhibitors: Secondary | ICD-10-CM | POA: Diagnosis not present

## 2021-07-30 DIAGNOSIS — D0512 Intraductal carcinoma in situ of left breast: Secondary | ICD-10-CM

## 2021-07-30 DIAGNOSIS — Z170 Estrogen receptor positive status [ER+]: Secondary | ICD-10-CM | POA: Insufficient documentation

## 2021-07-30 DIAGNOSIS — Z923 Personal history of irradiation: Secondary | ICD-10-CM | POA: Diagnosis not present

## 2021-07-30 LAB — CBC WITH DIFFERENTIAL/PLATELET
Abs Immature Granulocytes: 0.31 10*3/uL — ABNORMAL HIGH (ref 0.00–0.07)
Basophils Absolute: 0.1 10*3/uL (ref 0.0–0.1)
Basophils Relative: 1 %
Eosinophils Absolute: 0.3 10*3/uL (ref 0.0–0.5)
Eosinophils Relative: 3 %
HCT: 34.5 % — ABNORMAL LOW (ref 36.0–46.0)
Hemoglobin: 11.5 g/dL — ABNORMAL LOW (ref 12.0–15.0)
Immature Granulocytes: 3 %
Lymphocytes Relative: 56 %
Lymphs Abs: 6 10*3/uL — ABNORMAL HIGH (ref 0.7–4.0)
MCH: 29.9 pg (ref 26.0–34.0)
MCHC: 33.3 g/dL (ref 30.0–36.0)
MCV: 89.8 fL (ref 80.0–100.0)
Monocytes Absolute: 0.7 10*3/uL (ref 0.1–1.0)
Monocytes Relative: 6 %
Neutro Abs: 3.2 10*3/uL (ref 1.7–7.7)
Neutrophils Relative %: 31 %
Platelets: 134 10*3/uL — ABNORMAL LOW (ref 150–400)
RBC: 3.84 MIL/uL — ABNORMAL LOW (ref 3.87–5.11)
RDW: 13.2 % (ref 11.5–15.5)
Smear Review: DECREASED
WBC: 10.6 10*3/uL — ABNORMAL HIGH (ref 4.0–10.5)
nRBC: 0 % (ref 0.0–0.2)

## 2021-07-30 LAB — COMPREHENSIVE METABOLIC PANEL
ALT: 19 U/L (ref 0–44)
AST: 29 U/L (ref 15–41)
Albumin: 3.8 g/dL (ref 3.5–5.0)
Alkaline Phosphatase: 41 U/L (ref 38–126)
Anion gap: 9 (ref 5–15)
BUN: 17 mg/dL (ref 8–23)
CO2: 24 mmol/L (ref 22–32)
Calcium: 9.3 mg/dL (ref 8.9–10.3)
Chloride: 105 mmol/L (ref 98–111)
Creatinine, Ser: 1 mg/dL (ref 0.44–1.00)
GFR, Estimated: 56 mL/min — ABNORMAL LOW (ref >60)
Glucose, Bld: 95 mg/dL (ref 70–99)
Potassium: 4.2 mmol/L (ref 3.5–5.1)
Sodium: 138 mmol/L (ref 135–145)
Total Bilirubin: 0.6 mg/dL (ref 0.3–1.2)
Total Protein: 6.4 g/dL — ABNORMAL LOW (ref 6.5–8.1)

## 2021-07-30 LAB — MAGNESIUM: Magnesium: 1.9 mg/dL (ref 1.7–2.4)

## 2021-07-30 LAB — PHOSPHORUS: Phosphorus: 3.3 mg/dL (ref 2.5–4.6)

## 2021-07-30 NOTE — Progress Notes (Signed)
Patient reports issues with "enlarged clitoris" for which she has starting some new medications. She also reports diarrhea over the last 3 days which has improved some, she thinks this may be due to the amoxicillin.

## 2021-08-01 ENCOUNTER — Telehealth: Payer: Self-pay | Admitting: Pharmacy Technician

## 2021-08-01 NOTE — Telephone Encounter (Signed)
Oral Oncology Patient Advocate Encounter   Was successful in renewing patient a $9300 grant from Patient [facility]) to provide copayment coverage for Imbruvica.  This will keep the out of pocket expense at $0.    The billing information is as follows and has been shared with [place].   Member ID: [id number] Group ID: [id number] RxBin: [id number] Dates of Eligibility: 08/23/21 through 08/22/22  Fund:  [facility] [place] Phone [phone] Fax [phone] 08/01/2021 11:47 AM

## 2021-08-02 ENCOUNTER — Other Ambulatory Visit (HOSPITAL_COMMUNITY): Payer: Self-pay

## 2021-08-07 ENCOUNTER — Telehealth: Payer: Self-pay | Admitting: Family Medicine

## 2021-08-07 NOTE — Telephone Encounter (Signed)
Received a referral from [place] for prolapse bladder. I called the patient to schedule and she declined. Let her know that Dr. Matilde Sprang is the only MD that treats this and he is booked out for NP appts until October. She declined to schedule at this time. Left message with the PCP that they needed to refer her out to another provider.   Sharyn Lull

## 2021-08-14 ENCOUNTER — Telehealth: Payer: Self-pay | Admitting: *Deleted

## 2021-08-14 ENCOUNTER — Other Ambulatory Visit (HOSPITAL_COMMUNITY): Payer: Self-pay

## 2021-08-14 DIAGNOSIS — R339 Retention of urine, unspecified: Secondary | ICD-10-CM

## 2021-08-14 DIAGNOSIS — C9110 Chronic lymphocytic leukemia of B-cell type not having achieved remission: Secondary | ICD-10-CM

## 2021-08-14 DIAGNOSIS — D0512 Intraductal carcinoma in situ of left breast: Secondary | ICD-10-CM

## 2021-08-14 MED FILL — Ibrutinib Tab 420 MG: ORAL | 28 days supply | Qty: 28 | Fill #3 | Status: AC

## 2021-08-14 NOTE — Telephone Encounter (Signed)
Urology referral entered. 

## 2021-08-14 NOTE — Telephone Encounter (Signed)
Patient called stating at her last visit, Dr Grayland Ormond told her that she needed to see Urology regarding her current problem feeling like something is stuck up her urinary tract jabbing her and feeling like she has to urinate constantly. Her PCP Dr Clemmie Krill has tried getting patient appointment, but lacks the relationship with doctor to get an earlier appointment than mid October. Patient is requesting that Dr Grayland Ormond refer her to Urology for an appointment ASAP. Please advise

## 2021-08-16 ENCOUNTER — Emergency Department
Admission: EM | Admit: 2021-08-16 | Discharge: 2021-08-17 | Disposition: A | Discharge status: Home / Self Care | Payer: Medicare HMO | Attending: Emergency Medicine | Admitting: Emergency Medicine

## 2021-08-16 ENCOUNTER — Other Ambulatory Visit: Payer: Self-pay

## 2021-08-16 DIAGNOSIS — N760 Acute vaginitis: Secondary | ICD-10-CM | POA: Diagnosis not present

## 2021-08-16 DIAGNOSIS — N3090 Cystitis, unspecified without hematuria: Secondary | ICD-10-CM | POA: Diagnosis not present

## 2021-08-16 DIAGNOSIS — B9689 Other specified bacterial agents as the cause of diseases classified elsewhere: Secondary | ICD-10-CM | POA: Diagnosis not present

## 2021-08-16 DIAGNOSIS — Z87891 Personal history of nicotine dependence: Secondary | ICD-10-CM | POA: Insufficient documentation

## 2021-08-16 DIAGNOSIS — N8110 Cystocele, unspecified: Secondary | ICD-10-CM

## 2021-08-16 DIAGNOSIS — Z853 Personal history of malignant neoplasm of breast: Secondary | ICD-10-CM | POA: Diagnosis not present

## 2021-08-16 DIAGNOSIS — K219 Gastro-esophageal reflux disease without esophagitis: Secondary | ICD-10-CM | POA: Diagnosis not present

## 2021-08-16 DIAGNOSIS — R102 Pelvic and perineal pain: Secondary | ICD-10-CM | POA: Diagnosis present

## 2021-08-16 DIAGNOSIS — N819 Female genital prolapse, unspecified: Secondary | ICD-10-CM | POA: Diagnosis not present

## 2021-08-16 LAB — COMPREHENSIVE METABOLIC PANEL
ALT: 30 U/L (ref 0–44)
AST: 22 U/L (ref 15–41)
Albumin: 3.9 g/dL (ref 3.5–5.0)
Alkaline Phosphatase: 50 U/L (ref 38–126)
Anion gap: 6 (ref 5–15)
BUN: 18 mg/dL (ref 8–23)
CO2: 24 mmol/L (ref 22–32)
Calcium: 9 mg/dL (ref 8.9–10.3)
Chloride: 109 mmol/L (ref 98–111)
Creatinine, Ser: 1.07 mg/dL — ABNORMAL HIGH (ref 0.44–1.00)
GFR, Estimated: 51 mL/min — ABNORMAL LOW (ref >60)
Glucose, Bld: 91 mg/dL (ref 70–99)
Potassium: 4 mmol/L (ref 3.5–5.1)
Sodium: 139 mmol/L (ref 135–145)
Total Bilirubin: 0.5 mg/dL (ref 0.3–1.2)
Total Protein: 6.8 g/dL (ref 6.5–8.1)

## 2021-08-16 LAB — URINALYSIS, ROUTINE W REFLEX MICROSCOPIC
Bilirubin Urine: NEGATIVE
Glucose, UA: NEGATIVE mg/dL
Ketones, ur: NEGATIVE mg/dL
Nitrite: NEGATIVE
Protein, ur: >300 mg/dL — AB
RBC / HPF: >50 RBC/hpf — ABNORMAL HIGH (ref 0–5)
Specific Gravity, Urine: 1.02 (ref 1.005–1.030)
WBC, UA: >50 WBC/hpf — ABNORMAL HIGH (ref 0–5)
pH: 8.5 — ABNORMAL HIGH (ref 5.0–8.0)

## 2021-08-16 LAB — CBC WITH DIFFERENTIAL/PLATELET
Abs Immature Granulocytes: 0.11 10*3/uL — ABNORMAL HIGH (ref 0.00–0.07)
Basophils Absolute: 0.1 10*3/uL (ref 0.0–0.1)
Basophils Relative: 1 %
Eosinophils Absolute: 0.2 10*3/uL (ref 0.0–0.5)
Eosinophils Relative: 2 %
HCT: 34.5 % — ABNORMAL LOW (ref 36.0–46.0)
Hemoglobin: 11.8 g/dL — ABNORMAL LOW (ref 12.0–15.0)
Immature Granulocytes: 1 %
Lymphocytes Relative: 36 %
Lymphs Abs: 3.9 10*3/uL (ref 0.7–4.0)
MCH: 30.9 pg (ref 26.0–34.0)
MCHC: 34.2 g/dL (ref 30.0–36.0)
MCV: 90.3 fL (ref 80.0–100.0)
Monocytes Absolute: 0.7 10*3/uL (ref 0.1–1.0)
Monocytes Relative: 6 %
Neutro Abs: 6 10*3/uL (ref 1.7–7.7)
Neutrophils Relative %: 54 %
Platelets: 174 10*3/uL (ref 150–400)
RBC: 3.82 MIL/uL — ABNORMAL LOW (ref 3.87–5.11)
RDW: 12.8 % (ref 11.5–15.5)
WBC: 11 10*3/uL — ABNORMAL HIGH (ref 4.0–10.5)
nRBC: 0 % (ref 0.0–0.2)

## 2021-08-16 NOTE — ED Triage Notes (Signed)
C/o past month of vaginal white discharge, clitorial swelling, intermittent urinary retention. States has been going to pmd frequently, has appt with urology next month for same. Denies fever.

## 2021-08-16 NOTE — ED Notes (Signed)
Urine cup provided, pt states cannot urinate at this time

## 2021-08-16 NOTE — ED Notes (Signed)
Care transferred, report received from [place], [place]

## 2021-08-16 NOTE — ED Provider Notes (Signed)
[facility] Emergency Department Provider Note ____________________________________________   Event Date/Time   First MD Initiated Contact with Patient 08/16/21 2300     (approximate)  I have reviewed the triage vital signs and the nursing notes.   HISTORY  Chief Complaint Vaginitis    HPI Megan Santana is a 87 y.o. female with PMH as noted below who presents with dysuria over the last month, associated with hesitancy and some retention when she tries to urinate, but then incontinence at times when she is getting up from sitting down.  Her urine is cloudy and has some sediment in it.  She also feels pain in her lower abdomen and pelvic area.  In addition the patient has had multiple episodes where she feels swelling in her vaginal or clitoral area and feels a mass protruding.  This often happens when she urinates.  She states that it is sometimes associated with orange jellylike discharge and itching.  She denies any bleeding.  She was on a course of amoxicillin about a month ago but did not have relief at that time.  She has seen her primary care doctor about it but been unable to get an appointment with a urologist.  Past Medical History:  Diagnosis Date   Arthritis    Breast cancer ([place]) 2007   right lumpectomy   Breast cancer ([place]) 03/31/2017   16 mm invasive mammary carcinoma, left upper outer quadrant, T1c, N0, triple negative, histologic grade 3.   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   CLL (chronic lymphocytic leukemia) ([place]) 02/10/2020   Goals of care,  counseling/discussion 05/11/2017   Carcinoma of upper-outer quadrant of left breast in female, estrogen receptor negative ([place]) 04/08/2017   Lumbar stenosis with neurogenic claudication 01/30/2015    Past Surgical History:  Procedure Laterality Date   ABDOMINAL HYSTERECTOMY     total   BACK SURGERY     lumbar   BREAST BIOPSY Bilateral 03/31/2017   bilat u/s core INVASIVE MAMMARY CARCINOMA   BREAST BIOPSY Right 03/31/2017   DENSE FIBROSIS AND SHEETS OF MACROPHAGES   BREAST EXCISIONAL BIOPSY Left 2018   BREAST LUMPECTOMY Right 2007   BREAST LUMPECTOMY Left 04/23/2017   BREAST LUMPECTOMY Left 04/23/2017   Procedure: BREAST LUMPECTOMY WITH EXCISION OF SENTINEL NODE;  Surgeon: Robert Bellow, MD;  Whole breast radiation ORS;  Service: General;  Laterality: Left;   BREAST SURGERY Right    Wide excision   COLONOSCOPY     EYE SURGERY     cataracts bil   FRACTURE SURGERY Left 2015 or 2016   INCISION AND DRAINAGE ABSCESS Left 03/27/2021   Procedure: debridement left chest wall and seroma drainage;  Surgeon: Robert Bellow, MD;  Location: [facility] ORS;  Service: General;  Laterality: Left;   SIMPLE MASTECTOMY WITH AXILLARY SENTINEL NODE BIOPSY Left 02/25/2021   Procedure: SIMPLE MASTECTOMY WITH AXILLARY SENTINEL NODE BIOPSY;  Surgeon: Robert Bellow, MD;  Location: [facility] ORS;  Service: General;  Laterality: Left;  TUMOR EXCISION  2001    Prior to Admission medications   Medication Sig Start Date End Date Taking? Authorizing Provider  cephALEXin (KEFLEX) 500 MG capsule Take 1 capsule (500 mg total) by mouth 2 (two) times daily for 7 days. 08/17/21 08/24/21 Yes Arta Silence, MD  metroNIDAZOLE (FLAGYL) 500 MG tablet Take 1 tablet (500 mg total) by mouth 2 (two) times daily for 7 days. 08/17/21 08/24/21 Yes Arta Silence, MD  acetaminophen (TYLENOL) 500 MG tablet Take 1,000 mg by mouth every 6 (six) hours as needed (for pain.).    [provider]  albuterol (VENTOLIN HFA)  108 (90 Base) MCG/ACT inhaler Inhale 2 puffs into the lungs every 4 (four) hours as needed. 06/11/21   [provider]  calcium carbonate (TUMS EX) 750 MG chewable tablet Chew 1-2 tablets by mouth 3 (three) times daily as needed (for heartburn/indigestion).    [provider]  ibrutinib 420 MG TABS TAKE 1 TABLET (420 MG) BY MOUTH DAILY. 02/26/21 02/26/22  Lloyd Huger, MD  Magnesium Oxide 250 MG TABS Take 250 mg by mouth daily.     [provider]  Multiple Minerals-Vitamins (CAL MAG ZINC +D3 PO) Take 1 tablet by mouth daily.    [provider]  Multiple Vitamins-Minerals (CENTRUM SILVER 50+WOMEN) TABS Take 1 tablet by mouth daily.    [provider]  oxybutynin (DITROPAN) 5 MG tablet Take 5 mg by mouth 3 (three) times daily. 07/23/21   [provider]  phenazopyridine (PYRIDIUM) 200 MG tablet Take 200 mg by mouth 3 (three) times daily as needed. 07/26/21   [provider]  Potassium 99 MG TABS Take 99 mg by mouth daily.     [provider]  tamoxifen (NOLVADEX) 20 MG tablet Take 1 tablet (20 mg total) by mouth daily. 03/27/21   Lloyd Huger, MD    Allergies Oxycodone  Family History  Problem Relation Age of Onset   Breast cancer Sister 36   Lung cancer Mother 57       mets to breast   Lung cancer Father 6   Heart attack Brother    Breast cancer Maternal Aunt        age unknown   Stroke Sister 20    Social History Social History   Tobacco Use   Smoking status: Former    Packs/day: 0.25    Years: 10.00    Pack years: 2.50    Types: Cigarettes    Quit date: 1970    Years since quitting: 52.7   Smokeless tobacco: Never  Vaping Use   Vaping Use: Never used  Substance Use Topics   Alcohol use: Not Currently   Drug use: No    Review of Systems  Constitutional: No fever. Eyes: No redness. ENT: No sore throat. Cardiovascular: Denies chest pain. Respiratory: Denies shortness of  breath. Gastrointestinal: No vomiting.  Positive for diarrhea.  Genitourinary: Positive for dysuria.  Musculoskeletal: Negative for back pain. Skin: Negative for rash. Neurological: Negative for headache.   ____________________________________________   PHYSICAL EXAM:  VITAL SIGNS: ED Triage Vitals  Enc Vitals Group     BP 08/16/21 1912 (!) 173/79     Pulse Rate 08/16/21 1912 (!) 58     Resp 08/16/21 1912 16     Temp 08/16/21 1912 98.2 F (36.8 C)     Temp Source 08/16/21 1912 Oral     SpO2 08/16/21 1912 100 %     Weight 08/16/21 1911 154 lb  15.7 oz (70.3 kg)     Height 08/16/21 1911 '5\' 2"'$  (1.575 m)     Head Circumference --      Peak Flow --      Pain Score 08/16/21 1911 0     Pain Loc --      Pain Edu? --      Excl. in [place]? --     Constitutional: Alert and oriented. Well appearing for age and in no acute distress. Eyes: Conjunctivae are normal.  Head: Atraumatic. Nose: No congestion/rhinnorhea. Mouth/Throat: Mucous membranes are moist.   Neck: Normal range of motion.  Cardiovascular: Good peripheral circulation. Respiratory: Normal respiratory effort.  No retractions. Gastrointestinal: Soft with minimal suprapubic tenderness.  No distention.  Genitourinary: Normal external genitalia.  No palpable masses or swelling.  No prolapse.  No vaginal discharge or blood.  No significant tenderness. Musculoskeletal: Extremities warm and well perfused.  Neurologic:  Normal speech and language. No gross focal neurologic deficits are appreciated.  Skin:  Skin is warm and dry. No rash noted. Psychiatric: Mood and affect are normal. Speech and behavior are normal.  ____________________________________________   LABS (all labs ordered are listed, but only abnormal results are displayed)  Labs Reviewed  WET PREP, GENITAL - Abnormal; Notable for the following components:      Result Value   Clue Cells Wet Prep HPF POC PRESENT (*)    WBC, Wet Prep HPF POC FEW (*)    All other  components within normal limits  CBC WITH DIFFERENTIAL/PLATELET - Abnormal; Notable for the following components:   WBC 11.0 (*)    RBC 3.82 (*)    Hemoglobin 11.8 (*)    HCT 34.5 (*)    Abs Immature Granulocytes 0.11 (*)    All other components within normal limits  COMPREHENSIVE METABOLIC PANEL - Abnormal; Notable for the following components:   Creatinine, Ser 1.07 (*)    GFR, Estimated 51 (*)    All other components within normal limits  URINALYSIS, ROUTINE W REFLEX MICROSCOPIC - Abnormal; Notable for the following components:   Color, Urine YELLOW (*)    APPearance CLOUDY (*)    pH 8.5 (*)    Hgb urine dipstick MODERATE (*)    Protein, ur >300 (*)    Leukocytes,Ua LARGE (*)    RBC / HPF >50 (*)    WBC, UA >50 (*)    Bacteria, UA FEW (*)    All other components within normal limits   ____________________________________________  EKG   ____________________________________________  RADIOLOGY    ____________________________________________   PROCEDURES  Procedure(s) performed: No  Procedures  Critical Care performed: No ____________________________________________   INITIAL IMPRESSION / ASSESSMENT AND PLAN / ED COURSE  Pertinent labs & imaging results that were available during my care of the patient were reviewed by me and considered in my medical decision making (see chart for details).   87 year old female with PMH as noted above presents with multiple urinary and vaginal symptoms over approximately the last month.  She has had dysuria, both retention and incontinence, suprapubic pain, and cloudy urine.  In addition she has had the sensation of a swelling or something protruding in her vagina which is intermittent and associated with going to the bathroom.  I reviewed the past medical records in [place].  She is on imbruvica and tamoxifen for CLL.  On exam, the patient is well-appearing.  Her vital signs are normal.  Abdomen is soft with minimal suprapubic  discomfort.  Gynecologic exam is  overall unremarkable with no significant tenderness, no swelling or palpable masses, and no bleeding or discharge on speculum exam.  Labs obtained from triage are unremarkable except for mild leukocytosis.  The urinalysis has significant RBCs, WBCs, some bacteria and leukocytes.  Overall the urinary symptoms are most consistent with age-related etiologies such as urge incontinence/overflow incontinence, likely complicated by acute UTI/cystitis given the abnormal urinalysis and appearance of the urine as well as the suprapubic pain.  The presence of an intermittent palpable mass is most consistent with intermittent vaginal prolapse (patient is status post hysterectomy).  I do not see any significant discharge or evidence of acute cervicitis or vaginitis but we have sent a wet prep.  If the wet prep is normal, plan will be discharged home with antibiotics for UTI and both gynecology and urology follow-up.  ----------------------------------------- 12:37 AM on 08/17/2021 -----------------------------------------  Wet prep is positive for clue cells I will treat both for UTI with Keflex and BV with Flagyl.  I consulted Dr. Marcelline Mates from OB/GYN who agrees with the current management and will help facilitate follow-up.  The patient is stable for discharge home.  I counseled her on the results of the work-up and the plan of care.  Return precautions given, and she expresses understanding.   ____________________________________________   FINAL CLINICAL IMPRESSION(S) / ED DIAGNOSES  Final diagnoses:  Cystitis  Vaginal prolapse  Bacterial vaginosis      NEW MEDICATIONS STARTED DURING THIS VISIT:  New Prescriptions   CEPHALEXIN (KEFLEX) 500 MG CAPSULE    Take 1 capsule (500 mg total) by mouth 2 (two) times daily for 7 days.   METRONIDAZOLE (FLAGYL) 500 MG TABLET    Take 1 tablet (500 mg total) by mouth 2 (two) times daily for 7 days.     Note:  This document  was prepared using Dragon voice recognition software and may include unintentional dictation errors.    Arta Silence, MD 08/17/21 [phone]

## 2021-08-17 LAB — WET PREP, GENITAL
Sperm: NONE SEEN
Trich, Wet Prep: NONE SEEN
Yeast Wet Prep HPF POC: NONE SEEN

## 2021-08-17 MED ORDER — CEPHALEXIN 500 MG PO CAPS: 500.0000 mg | ORAL_CAPSULE | Freq: Two times a day (BID) | ORAL | 0 refills | Status: AC

## 2021-08-17 MED ORDER — METRONIDAZOLE 500 MG PO TABS: 500.0000 mg | ORAL_TABLET | Freq: Two times a day (BID) | ORAL | 0 refills | Status: AC

## 2021-08-17 NOTE — Discharge Instructions (Addendum)
Take the antibiotics as prescribed and finish the full 7-day course of both.  Follow-up with Dr. Marcelline Mates from gynecology.  Follow-up with urology next month as planned.  We suspect that you are having an intermittent vaginal prolapse.  In addition you likely have a bladder infection and bacterial vaginosis.  Return to the ER for new, worsening, or persistent severe swelling or pain, bleeding, discharge, fever, complete inability to urinate, or any other new or worsening symptoms that concern you.

## 2021-08-20 NOTE — Telephone Encounter (Signed)
Message left at [facility] for an update on status of referral.

## 2021-08-21 ENCOUNTER — Other Ambulatory Visit (HOSPITAL_COMMUNITY): Payer: Self-pay

## 2021-08-21 NOTE — Telephone Encounter (Signed)
Pt has been scheduled an appointment with [facility].

## 2021-09-10 ENCOUNTER — Other Ambulatory Visit: Payer: Self-pay

## 2021-09-10 ENCOUNTER — Ambulatory Visit: Payer: Medicare HMO | Admitting: Obstetrics and Gynecology

## 2021-09-10 ENCOUNTER — Emergency Department
Admission: EM | Admit: 2021-09-10 | Discharge: 2021-09-10 | Disposition: A | Discharge status: Home / Self Care | Payer: Medicare HMO | Attending: Emergency Medicine | Admitting: Emergency Medicine

## 2021-09-10 ENCOUNTER — Encounter: Payer: Self-pay | Admitting: Obstetrics and Gynecology

## 2021-09-10 ENCOUNTER — Other Ambulatory Visit: Payer: Self-pay | Admitting: Oncology

## 2021-09-10 ENCOUNTER — Other Ambulatory Visit: Payer: Self-pay | Admitting: General Surgery

## 2021-09-10 ENCOUNTER — Other Ambulatory Visit (HOSPITAL_COMMUNITY): Payer: Self-pay

## 2021-09-10 VITALS — BP 155/55 | HR 60 | Resp 16 | Ht 62.0 in | Wt 156.0 lb

## 2021-09-10 DIAGNOSIS — N390 Urinary tract infection, site not specified: Secondary | ICD-10-CM | POA: Diagnosis not present

## 2021-09-10 DIAGNOSIS — B9689 Other specified bacterial agents as the cause of diseases classified elsewhere: Secondary | ICD-10-CM | POA: Diagnosis not present

## 2021-09-10 DIAGNOSIS — R338 Other retention of urine: Secondary | ICD-10-CM

## 2021-09-10 DIAGNOSIS — Z853 Personal history of malignant neoplasm of breast: Secondary | ICD-10-CM | POA: Diagnosis not present

## 2021-09-10 DIAGNOSIS — Z87891 Personal history of nicotine dependence: Secondary | ICD-10-CM | POA: Diagnosis not present

## 2021-09-10 DIAGNOSIS — R339 Retention of urine, unspecified: Secondary | ICD-10-CM

## 2021-09-10 DIAGNOSIS — N3941 Urge incontinence: Secondary | ICD-10-CM | POA: Diagnosis not present

## 2021-09-10 DIAGNOSIS — Z1231 Encounter for screening mammogram for malignant neoplasm of breast: Secondary | ICD-10-CM

## 2021-09-10 DIAGNOSIS — N8111 Cystocele, midline: Secondary | ICD-10-CM | POA: Diagnosis not present

## 2021-09-10 DIAGNOSIS — C9110 Chronic lymphocytic leukemia of B-cell type not having achieved remission: Secondary | ICD-10-CM

## 2021-09-10 LAB — CBC
HCT: 36 % (ref 36.0–46.0)
Hemoglobin: 11.8 g/dL — ABNORMAL LOW (ref 12.0–15.0)
MCH: 29.7 pg (ref 26.0–34.0)
MCHC: 32.8 g/dL (ref 30.0–36.0)
MCV: 90.7 fL (ref 80.0–100.0)
Platelets: 128 10*3/uL — ABNORMAL LOW (ref 150–400)
RBC: 3.97 MIL/uL (ref 3.87–5.11)
RDW: 13 % (ref 11.5–15.5)
WBC: 6.3 10*3/uL (ref 4.0–10.5)
nRBC: 0 % (ref 0.0–0.2)

## 2021-09-10 LAB — COMPREHENSIVE METABOLIC PANEL
ALT: 19 U/L (ref 0–44)
AST: 20 U/L (ref 15–41)
Albumin: 3.8 g/dL (ref 3.5–5.0)
Alkaline Phosphatase: 40 U/L (ref 38–126)
Anion gap: 5 (ref 5–15)
BUN: 15 mg/dL (ref 8–23)
CO2: 26 mmol/L (ref 22–32)
Calcium: 9.3 mg/dL (ref 8.9–10.3)
Chloride: 109 mmol/L (ref 98–111)
Creatinine, Ser: 0.85 mg/dL (ref 0.44–1.00)
GFR, Estimated: >60 mL/min (ref >60)
Glucose, Bld: 88 mg/dL (ref 70–99)
Potassium: 4.7 mmol/L (ref 3.5–5.1)
Sodium: 140 mmol/L (ref 135–145)
Total Bilirubin: 0.7 mg/dL (ref 0.3–1.2)
Total Protein: 6.5 g/dL (ref 6.5–8.1)

## 2021-09-10 LAB — URINALYSIS, ROUTINE W REFLEX MICROSCOPIC
Bilirubin Urine: NEGATIVE
Glucose, UA: NEGATIVE mg/dL
Hgb urine dipstick: NEGATIVE
Ketones, ur: NEGATIVE mg/dL
Nitrite: POSITIVE — AB
Protein, ur: NEGATIVE mg/dL
Specific Gravity, Urine: 1.012 (ref 1.005–1.030)
WBC, UA: >50 WBC/hpf — ABNORMAL HIGH (ref 0–5)
pH: 5 (ref 5.0–8.0)

## 2021-09-10 MED ORDER — CEPHALEXIN 500 MG PO CAPS
500.0000 mg | ORAL_CAPSULE | Freq: Once | ORAL | Status: AC
  Administered 2021-09-10: 500 mg via ORAL
  Filled 2021-09-10: qty 1

## 2021-09-10 MED ORDER — CEPHALEXIN 500 MG PO CAPS: 500.0000 mg | ORAL_CAPSULE | Freq: Four times a day (QID) | ORAL | 0 refills | Status: DC

## 2021-09-10 MED ORDER — IMBRUVICA 420 MG PO TABS
ORAL_TABLET | ORAL | 3 refills | Status: DC
  Filled 2021-09-17: qty 28, 28d supply, fill #0
  Filled 2021-10-08: qty 28, 28d supply, fill #1
  Filled 2021-11-13: qty 28, 28d supply, fill #2
  Filled 2021-12-05: qty 28, 28d supply, fill #3

## 2021-09-10 NOTE — ED Triage Notes (Signed)
Pt presents to ED with /co of urinary retention. Pt states urology apt on Monday but states [facility] sent pt here for eval.   Pt states she has not urinated since 1000 this morning but states minimal oral intake. Pt states HX of this.

## 2021-09-10 NOTE — ED Notes (Signed)
Bladder scan 486 mL.

## 2021-09-10 NOTE — ED Provider Notes (Signed)
Emergency Medicine Provider Triage Evaluation Note  Megan Santana , a 87 y.o. female  was evaluated in triage.  Pt complains of urinary retention since 10 AM.  Patient has been complaining of vaginal pain off and on for the past several weeks and has a follow-up appointment with urology on Monday with concern for possible prolapsed bladder.  She denies suprapubic pain.  She states that she has had some vaginal burning but denies dysuria or vaginal pruritus.  Patient was referred by PCP for Foley placement till patient can be seen by urology on Monday.  Review of Systems  Positive: Patient has vaginal pain and urinary retention. Negative: No suprapubic pain, low back pain or CVA tenderness.  Physical Exam  BP (!) 160/88 (BP Location: Right Arm)   Pulse (!) 54   Temp 98.4 F (36.9 C) (Oral)   Resp 18   SpO2 95%  Gen:   Awake, no distress   Resp:  Normal effort  MSK:   Moves extremities without difficulty  Other:  No suprapubic pain or CVA tenderness.  Medical Decision Making  Medically screening exam initiated at 5:07 PM.  Appropriate orders placed.  Megan Santana was informed that the remainder of the evaluation will be completed by another provider, this initial triage assessment does not replace that evaluation, and the importance of remaining in the ED until their evaluation is complete.  Patient okay for Flex.  Will obtain basic labs/urinalysis and will wait for Flex room to place Foley catheter.   Vallarie Mare Teague, PA-C 09/10/21 1711    Delman Kitten, MD 09/10/21 [phone]

## 2021-09-10 NOTE — ED Provider Notes (Signed)
[facility]  ____________________________________________   Event Date/Time   First MD Initiated Contact with Patient 09/10/21 1724     (approximate)  I have reviewed the triage vital signs and the nursing notes.   HISTORY  Chief Complaint No chief complaint on file.    HPI Megan Santana is a 87 y.o. female with past medical history of urinary retention, cystocele, urge incontinence who presents with urinary retention.  Patient has had issues with urinary tension for the last month.  She was seen in the ED at the beginning of the month was diagnosed with a UTI, ultimately did not have a Foley placed.  Apparently prior to this she had a Foley placed in the gynecology office but it was too painful and was ultimately removed.  Her ability to urinate fluctuates, some days is very difficult others more easily.  She often has incontinence as well.  Today she has not been able to urinate since 10 AM.  She had an appointment with Jeannie Fend with gynecology who recommended she come to the emergency department for evaluation from possible Foley catheter placement.  Patient does endorse lower abdominal fullness and feeling like she needs to go but is unable to.  Also has dysuria and burning.  She denies fevers or chills or back pain.         Past Medical History:  Diagnosis Date   Arthritis    Breast cancer ([place]) 2007   right lumpectomy   Breast cancer ([place]) 03/31/2017   16 mm invasive mammary carcinoma, left upper outer quadrant, T1c, N0, triple negative, histologic grade 3.   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   CLL (chronic lymphocytic leukemia) ([place]) 02/10/2020   Goals of care, counseling/discussion 05/11/2017   Carcinoma of upper-outer quadrant of left breast in female, estrogen receptor negative ([place]) 04/08/2017   Lumbar stenosis with neurogenic claudication 01/30/2015    Past Surgical History:  Procedure Laterality Date   ABDOMINAL HYSTERECTOMY     total   BACK SURGERY     lumbar   BREAST BIOPSY Bilateral 03/31/2017   bilat u/s core INVASIVE MAMMARY CARCINOMA   BREAST BIOPSY Right 03/31/2017   DENSE FIBROSIS AND SHEETS OF MACROPHAGES   BREAST EXCISIONAL BIOPSY Left 2018   BREAST LUMPECTOMY Right 2007   BREAST LUMPECTOMY Left 04/23/2017   BREAST LUMPECTOMY Left 04/23/2017   Procedure: BREAST LUMPECTOMY WITH EXCISION OF SENTINEL NODE;  Surgeon: Robert Bellow, MD;  Whole breast radiation ORS;  Service: General;  Laterality: Left;   BREAST SURGERY Right    Wide excision   COLONOSCOPY     EYE SURGERY     cataracts bil   FRACTURE SURGERY Left 2015 or 2016   INCISION AND DRAINAGE ABSCESS Left 03/27/2021   Procedure: debridement left chest wall and seroma drainage;  Surgeon: Robert Bellow, MD;  Location: [facility] ORS;  Service: General;  Laterality: Left;   SIMPLE MASTECTOMY WITH AXILLARY SENTINEL NODE BIOPSY Left 02/25/2021   Procedure: SIMPLE  MASTECTOMY WITH AXILLARY SENTINEL NODE BIOPSY;  Surgeon: Robert Bellow, MD;  Location: [facility] ORS;  Service: General;  Laterality: Left;   TUMOR EXCISION  2001    Prior to Admission medications   Medication Sig Start Date End Date Taking? Authorizing Provider  cephALEXin (KEFLEX) 500 MG capsule Take 1 capsule (500 mg total) by mouth 4 (four) times daily for 10 days. 09/10/21 09/20/21 Yes Rada Hay, MD  acetaminophen (TYLENOL) 500 MG tablet Take 1,000 mg by mouth every 6 (six) hours as needed (for pain.).    [provider]  albuterol (VENTOLIN HFA) 108 (90 Base) MCG/ACT  inhaler Inhale 2 puffs into the lungs every 4 (four) hours as needed. 06/11/21   [provider]  calcium carbonate (TUMS EX) 750 MG chewable tablet Chew 1-2 tablets by mouth 3 (three) times daily as needed (for heartburn/indigestion).    [provider]  ibrutinib (IMBRUVICA) 420 MG TABS TAKE 1 TABLET (420 MG) BY MOUTH DAILY. 09/10/21 09/10/22  Lloyd Huger, MD  Magnesium Oxide 250 MG TABS Take 250 mg by mouth daily.     [provider]  Multiple Minerals-Vitamins (CAL MAG ZINC +D3 PO) Take 1 tablet by mouth daily.    [provider]  Multiple Vitamins-Minerals (CENTRUM SILVER 50+WOMEN) TABS Take 1 tablet by mouth daily.    [provider]  oxybutynin (DITROPAN) 5 MG tablet Take 5 mg by mouth 3 (three) times daily. 07/23/21   [provider]  phenazopyridine (PYRIDIUM) 200 MG tablet Take 200 mg by mouth 3 (three) times daily as needed. 07/26/21   [provider]  Potassium 99 MG TABS Take 99 mg by mouth daily.     [provider]  tamoxifen (NOLVADEX) 20 MG tablet Take 1 tablet (20 mg total) by mouth daily. 03/27/21   Lloyd Huger, MD    Allergies Oxycodone  Family History  Problem Relation Age of Onset   Breast cancer Sister 26   Lung cancer Mother 66       mets to breast   Lung cancer Father 77   Heart attack Brother    Breast cancer Maternal Aunt        age unknown   Stroke Sister 27    Social History Social History   Tobacco Use   Smoking status: Former    Packs/day: 0.25    Years: 10.00    Pack years: 2.50    Types: Cigarettes    Quit date: 1970    Years since quitting: 52.7   Smokeless tobacco: Never  Vaping Use   Vaping Use: Never used  Substance Use Topics   Alcohol use: Not Currently   Drug use: No    Review of Systems   Review of Systems  Constitutional:  Negative for chills and fever.  Respiratory:  Negative for shortness of breath.   Gastrointestinal:  Positive for abdominal  pain.  Genitourinary:  Positive for difficulty urinating and dysuria. Negative for frequency.  Musculoskeletal:  Negative for back pain.  All other systems reviewed and are negative.  Physical Exam Updated Vital Signs BP (!) 160/88 (BP Location: Right Arm)   Pulse (!) 54   Temp 98.4 F (36.9 C) (Oral)   Resp 18   Ht 5\' 2"  (1.575 m)   Wt 70.7 kg   SpO2 95%   BMI 28.51 kg/m   Physical Exam Vitals and nursing note reviewed.  Constitutional:      General: She is not in acute  distress.    Appearance: Normal appearance.  HENT:     Head: Normocephalic and atraumatic.  Eyes:     General: No scleral icterus.    Conjunctiva/sclera: Conjunctivae normal.  Pulmonary:     Effort: Pulmonary effort is normal. No respiratory distress.     Breath sounds: No stridor.  Abdominal:     General: Abdomen is flat.     Palpations: Abdomen is soft.     Comments: Mild suprapubic tenderness   Musculoskeletal:        General: No deformity or signs of injury.     Cervical back: Normal range of motion.  Skin:    General: Skin is dry.     Coloration: Skin is not jaundiced or pale.  Neurological:     General: No focal deficit present.     Mental Status: She is alert and oriented to person, place, and time. Mental status is at baseline.     Comments: Hard of hearing  Psychiatric:        Mood and Affect: Mood normal.        Behavior: Behavior normal.     LABS (all labs ordered are listed, but only abnormal results are displayed)  Labs Reviewed  URINALYSIS, ROUTINE W REFLEX MICROSCOPIC - Abnormal; Notable for the following components:      Result Value   Color, Urine YELLOW (*)    APPearance CLOUDY (*)    Nitrite POSITIVE (*)    Leukocytes,Ua LARGE (*)    WBC, UA >50 (*)    Bacteria, UA RARE (*)    All other components within normal limits  CBC - Abnormal; Notable for the following components:   Hemoglobin 11.8 (*)    Platelets 128 (*)    All other components within normal limits   URINE CULTURE  COMPREHENSIVE METABOLIC PANEL   ____________________________________________  EKG  N/a ____________________________________________  RADIOLOGY Almeta Monas, personally viewed and evaluated these images (plain radiographs) as part of my medical decision making, as well as reviewing the written report by the radiologist.  ED MD interpretation:  n/a    ____________________________________________   PROCEDURES  Procedure(s) performed (including Critical Care):  Procedures   ____________________________________________   INITIAL IMPRESSION / ASSESSMENT AND PLAN / ED COURSE     87 year old female with history of cystocele who presents with urinary retention.  Unable to urinate since 10 AM today.  This has been an ongoing issue for her but has never lasted this long.  On exam she is well-appearing.  Does have some suprapubic tenderness but otherwise abdomen is benign.  Bladder scan performed in triage shows a bladder volume around 500 mL.  Discussed at length with the patient and her daughter that she really needs to have a Foley placed.  Patient was reluctant but agreeable.  We will send a UA to evaluate for infection.  Her renal function is normal today.  We will need to follow-up with urology and she has an appointment already scheduled for Monday.  Foley was placed with return of about 400 cc of urine.  Patient's UA is consistent with UTI, positive nitrates greater than 50 WBCs.  Will treat for complicated UTI with Keflex.  Patient was very uncomfortable status post Foley placement as she was before when she had a Foley placed in the office and insisted that we remove the Foley.  I discussed my concern for recurrent retention and need to return to the emergency department, renal failure and potential sepsis related  to UTI.  The patient expressed understanding of these risks and made the decision to have the Foley removed.  Discussed return precautions for  fevers and inability to urinate.  She has follow-up with urology on Monday.  Clinical Course as of 09/10/21 1941  Tue Sep 10, 2021  1842 Leukocytes,Ua(!): LARGE [KM]  1842 Nitrite(!): POSITIVE [KM]  1842 WBC, UA(!): >50 [KM]    Clinical Course User Index [KM] Rada Hay, MD     ____________________________________________   FINAL CLINICAL IMPRESSION(S) / ED DIAGNOSES  Final diagnoses:  Urinary tract infection without hematuria, site unspecified  Urinary retention     ED Discharge Orders          Ordered    cephALEXin (KEFLEX) 500 MG capsule  4 times daily        09/10/21 1933             Note:  This document was prepared using Dragon voice recognition software and may include unintentional dictation errors.    Rada Hay, MD 09/10/21 [phone]

## 2021-09-10 NOTE — Progress Notes (Signed)
HPI:      [name] is a 87 y.o. G3P3 who LMP was No LMP recorded. Patient has had a hysterectomy.  Subjective:   She presents today with the immediate problem of acute urinary retention.  She has been unable to urinate all day.  She says her bladder feels full and she has uncomfortable but something "pushing up inside her". She was recently seen in the emergency department diagnosed with a large cystocele, UTI and BV.  She has completed a course of antibiotics but continues to struggle on a daily basis with bladder emptying.  She has an appointment on Monday with urology. She saw Dr. Bary Santana today for an umbilical hernia. Patient states that her urinary retention has recently become worse.  She occasionally notices something bulging from the vagina but at other times it is gone.  She states that she also occasionally has complete emptying of her bladder, sudden urinary incontinence without notice. Of significant note patient has previously had an abdominal hysterectomy. She has CLL and has recently undergone a mastectomy and is on tamoxifen.    Hx: The following portions of the patient's history were reviewed and updated as appropriate:             She  has a past medical history of Arthritis, Breast cancer ([place]) (2007), Breast cancer ([place]) (03/31/2017), Family history of adverse reaction to anesthesia, GERD (gastroesophageal reflux disease), Heart murmur, Lumbar radiculitis, Lumbar stenosis, Personal history of chemotherapy (2018), Personal history of radiation therapy (2007), and Personal history of radiation therapy (2018). She does not have any pertinent problems on file. She  has a past surgical history that includes Abdominal hysterectomy; Breast surgery (Right); Back surgery; Eye surgery; Fracture surgery (Left, 2015 or 2016); Tumor excision (2001); Breast lumpectomy (Right, 2007); Breast lumpectomy (Left, 04/23/2017); Breast lumpectomy (Left, 04/23/2017); Breast biopsy (Bilateral,  03/31/2017); Breast biopsy (Right, 03/31/2017); Breast excisional biopsy (Left, 2018); Colonoscopy; Simple mastectomy with axillary sentinel node biopsy (Left, 02/25/2021); and Incision and drainage abscess (Left, 03/27/2021). Her family history includes Breast cancer in her maternal aunt; Breast cancer (age of onset: 25) in her sister; Heart attack in her brother; Lung cancer (age of onset: 29) in her father; Lung cancer (age of onset: 49) in her mother; Stroke (age of onset: 49) in her sister. She  reports that she quit smoking about 52 years ago. Her smoking use included cigarettes. She has a 2.50 pack-year smoking history. She has never used smokeless tobacco. She reports that she does not currently use alcohol. She reports that she does not use drugs. She has a current medication list which includes the following prescription(s): acetaminophen, albuterol, calcium carbonate, imbruvica, magnesium oxide, multiple minerals-vitamins, centrum silver 50+women, oxybutynin, phenazopyridine, potassium, and tamoxifen. She is allergic to oxycodone.       Review of Systems:  Review of Systems  Constitutional: Denied constitutional symptoms, night sweats, recent illness, fatigue, fever, insomnia and weight loss.  Eyes: Denied eye symptoms, eye pain, photophobia, vision change and visual disturbance.  Ears/Nose/Throat/Neck: Denied ear, nose, throat or neck symptoms, hearing loss, nasal discharge, sinus congestion and sore throat.  Cardiovascular: Denied cardiovascular symptoms, arrhythmia, chest pain/pressure, edema, exercise intolerance, orthopnea and palpitations.  Respiratory: Denied pulmonary symptoms, asthma, pleuritic pain, productive sputum, cough, dyspnea and wheezing.  Gastrointestinal: Denied, gastro-esophageal reflux, melena, nausea and vomiting.  Genitourinary: See HPI for additional information.  Musculoskeletal: Denied musculoskeletal symptoms, stiffness, swelling, muscle weakness and myalgia.   Dermatologic: Denied dermatology symptoms, rash and scar.  Neurologic: Denied neurology symptoms, dizziness, headache, neck pain and syncope.  Psychiatric: Denied psychiatric symptoms, anxiety and depression.  Endocrine: Denied endocrine symptoms including hot flashes and night sweats.   Meds:   Current Outpatient Medications on File Prior to Visit  Medication Sig Dispense Refill   acetaminophen (TYLENOL) 500 MG tablet Take 1,000 mg by mouth every 6 (six) hours as needed (for pain.).     albuterol (VENTOLIN HFA) 108 (90 Base) MCG/ACT inhaler Inhale 2 puffs into the lungs every 4 (four) hours as needed.     calcium carbonate (TUMS EX) 750 MG chewable tablet Chew 1-2 tablets by mouth 3 (three) times daily as needed (for heartburn/indigestion).     Magnesium Oxide 250 MG TABS Take 250 mg by mouth daily.      Multiple Minerals-Vitamins (CAL MAG ZINC +D3 PO) Take 1 tablet by mouth daily.     Multiple Vitamins-Minerals (CENTRUM SILVER 50+WOMEN) TABS Take 1 tablet by mouth daily.     oxybutynin (DITROPAN) 5 MG tablet Take 5 mg by mouth 3 (three) times daily.     phenazopyridine (PYRIDIUM) 200 MG tablet Take 200 mg by mouth 3 (three) times daily as needed.     Potassium 99 MG TABS Take 99 mg by mouth daily.      tamoxifen (NOLVADEX) 20 MG tablet Take 1 tablet (20 mg total) by mouth daily. 90 tablet 3   No current facility-administered medications on file prior to visit.      Objective:     Vitals:   09/10/21 1538  BP: (!) 155/55  Pulse: 60  Resp: 16   Filed Weights   09/10/21 1538  Weight: 156 lb (70.8 kg)              Patient unable to void.          Assessment:    G3P3 Patient Active Problem List   Diagnosis Date Noted   Ductal carcinoma in situ (DCIS) of left breast 03/14/2021   CLL (chronic lymphocytic leukemia) ([place]) 02/10/2020   Goals of care, counseling/discussion 05/11/2017   Carcinoma of upper-outer quadrant of left breast in female, estrogen receptor negative  ([place]) 04/08/2017   Lumbar stenosis with neurogenic claudication 01/30/2015     1. Acute urinary retention   2. Cystocele, midline   3. Urge incontinence of urine     Her immediate problem is acute urinary retention.  She has been unable to urinate today.  I suspect this is a function of her large cystocele as well as possible UTI.  Patient was recently tried at home with a urinary catheter but could not tolerate the catheter because of pain.   Plan:            1.  Discussed this in detail with the patient and her daughter.  I recommend they present to the emergency department for treatment of acute urinary retention and consultation with urology today.  Whether or not that needs catheterization with appropriate medication or catheterization with retention of the catheter will be up to urology.  2.  To keep urology appointment on Monday  3.  Once the acute situation has calmed down would consider discussing possible pelvic reconstructive surgery to fix the cystocele/pelvic prolapse.  Called emergency department and spoke with Dr. Jacqualine Code. Orders No orders of the defined types were placed in this encounter.   No orders of the defined types were placed in this encounter.     F/U  No follow-ups on file. I spent  33 minutes involved in the care of this patient preparing to see the patient by obtaining and reviewing her medical history (including labs, imaging tests and prior procedures), documenting clinical information in the electronic health record (EHR), counseling and coordinating care plans, writing and sending prescriptions, ordering tests or procedures and in direct communicating with the patient and medical staff discussing pertinent items from her history and physical exam.  Finis Bud, M.D. 09/10/2021 4:25 PM

## 2021-09-10 NOTE — ED Provider Notes (Signed)
Patient called in by OBGYN (Dr. Jeannie Fend)  Advised patient is at the clinic and he is referring to the ER for 'urinary retention' felt due to a large known cystoceole.  Recommends urine culture, foley cath, urology referral. Would ask that foley or urinary catheterization be attempted at ER to help alleviate symptoms as well, urgent.    Delman Kitten, MD 09/10/21 [phone]

## 2021-09-10 NOTE — Discharge Instructions (Signed)
Lease take the antibiotic 4 times daily for the next 10 days.  Please follow-up with urology as scheduled on Monday.  If you develop fevers or are unable to urinate, please return to the emergency department.

## 2021-09-12 LAB — URINE CULTURE
Culture: >=100000 — AB
Special Requests: NORMAL

## 2021-09-13 NOTE — Progress Notes (Signed)
Urine culture resulted >100,000 colonies ENTEROBACTER AEROGENES Resistant to cefazolin Patient discharged on cephalexin  Discussed with ED  provider Dr Delman Kitten Patient still experiencing UTI symptoms  Will d/c kefelx Start cefdinir 300mg  po BID x 10 day (prescription called into prefer pharmacy [facility])  Patent already scheduled for urologist f/u on Monday Oct/3

## 2021-09-16 ENCOUNTER — Ambulatory Visit: Payer: Medicare HMO | Admitting: Urology

## 2021-09-16 ENCOUNTER — Other Ambulatory Visit: Payer: Self-pay

## 2021-09-16 VITALS — BP 162/84 | HR 67 | Ht 62.0 in | Wt 155.0 lb

## 2021-09-16 DIAGNOSIS — R339 Retention of urine, unspecified: Secondary | ICD-10-CM

## 2021-09-16 DIAGNOSIS — N8110 Cystocele, unspecified: Secondary | ICD-10-CM

## 2021-09-16 DIAGNOSIS — N390 Urinary tract infection, site not specified: Secondary | ICD-10-CM

## 2021-09-16 LAB — BLADDER SCAN AMB NON-IMAGING: Scan Result: 451

## 2021-09-16 MED ORDER — TRIMETHOPRIM 100 MG PO TABS
100.0000 mg | ORAL_TABLET | Freq: Every day | ORAL | 11 refills | Status: DC
Start: 2021-09-16 — End: 2021-11-25

## 2021-09-16 NOTE — Progress Notes (Signed)
In and Out Catheterization  Patient is present today for a I & O catheterization due to urinary retention. Patient was cleaned and prepped in a sterile fashion with betadine . A 16FR cath was inserted no complications were noted , 430 ml of urine return was noted, urine was dark yellow in color. Bladder was drained  And catheter was removed with out difficulty.    Preformed by: Kerman Passey, RMA  Follow up/ Additional notes: as directed.

## 2021-09-16 NOTE — Progress Notes (Signed)
09/16/2021 3:24 PM   Megan Santana 1934/08/15 [id number]  Referring provider: Lloyd Huger, MD [place],  [place] [address]  Chief Complaint  Patient presents with   Urinary Retention    HPI: I was consulted to assess the patient for prolapse and incomplete bladder emptying.  At the end of September she went to the emergency room and was catheterized for 400 mL.  She has been told she has a cystocele and has had a hysterectomy.  She was catheterized for 400 mL.  She was given Keflex.  She has been treated 2 or 3 times in the last several weeks or a few months with antibiotics.  She is currently on Omnicef and had a positive culture  I do believe she feels vaginal bulging and it can spontaneously reduce some.  Her flow is sometimes good sometimes poor.  Sometimes she feels empty and other times she does not.  She has had an in and out catheterization 2 or 3 times.  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  She can go from a sitting to standing position and leak.  She does not cough and sneeze and denies stress incontinence and bedwetting. she can feel urgency and then cannot urinate.  She was a bit nonspecific but I think generally wears 1 or 2 pads a day but in the last 2 months it is more often but variable.  She has had back surgery.  She is alternating constipation and diarrhea.  She has had radiation and chemotherapy and is chronic lymphocytic lymphoma.  No new nodules on recent CT scan.  CT scan August 01, 2021 demonstrated no hydronephrosis  In March she had this year she was diagnosed and had surgery for breast cancer left side.  Then she had an infection.  She said to surgery and does not want more surgery  She states he felt uncomfortable especially when she stands near the umbilicus.   PMH: Past Medical History:   Diagnosis Date   Arthritis    Breast cancer ([place]) 2007   right lumpectomy   Breast cancer ([place]) 03/31/2017   16 mm invasive mammary carcinoma, left upper outer quadrant, T1c, N0, triple negative, histologic grade 3.   Family history of adverse reaction to anesthesia    mom n/v   GERD (gastroesophageal reflux disease)    Heart murmur    Lumbar radiculitis    Lumbar stenosis    Personal history of chemotherapy 2018   Left   Personal history of radiation therapy 2007   Right   Personal history of radiation therapy 2018   Left    Surgical History: Past Surgical History:  Procedure Laterality Date   ABDOMINAL HYSTERECTOMY     total   BACK SURGERY     lumbar   BREAST BIOPSY Bilateral 03/31/2017   bilat u/s core INVASIVE MAMMARY CARCINOMA   BREAST BIOPSY Right 03/31/2017   DENSE FIBROSIS AND SHEETS OF MACROPHAGES   BREAST EXCISIONAL BIOPSY Left 2018   BREAST LUMPECTOMY Right 2007   BREAST LUMPECTOMY Left 04/23/2017   BREAST LUMPECTOMY Left 04/23/2017   Procedure: BREAST LUMPECTOMY WITH EXCISION OF SENTINEL NODE;  Surgeon: Robert Bellow, MD;  Whole breast radiation ORS;  Service: General;  Laterality: Left;   BREAST SURGERY Right    Wide excision  COLONOSCOPY     EYE SURGERY     cataracts bil   FRACTURE SURGERY Left 2015 or 2016   INCISION AND DRAINAGE ABSCESS Left 03/27/2021   Procedure: debridement left chest wall and seroma drainage;  Surgeon: Robert Bellow, MD;  Location: [facility] ORS;  Service: General;  Laterality: Left;   SIMPLE MASTECTOMY WITH AXILLARY SENTINEL NODE BIOPSY Left 02/25/2021   Procedure: SIMPLE MASTECTOMY WITH AXILLARY SENTINEL NODE BIOPSY;  Surgeon: Robert Bellow, MD;  Location: [facility] ORS;  Service: General;  Laterality: Left;   TUMOR EXCISION  2001    Home Medications:  Allergies as of 09/16/2021       Reactions   Oxycodone Other (See Comments)   Can't sleep, feels bad when taking.        Medication List        Accurate as of  September 16, 2021  3:24 PM. If you have any questions, ask your nurse or doctor.          STOP taking these medications    cephALEXin 500 MG capsule Commonly known as: KEFLEX Stopped by: [name], MD       TAKE these medications    acetaminophen 500 MG tablet Commonly known as: TYLENOL Take 1,000 mg by mouth every 6 (six) hours as needed (for pain.).   albuterol 108 (90 Base) MCG/ACT inhaler Commonly known as: VENTOLIN HFA Inhale 2 puffs into the lungs every 4 (four) hours as needed.   CAL MAG ZINC +D3 PO Take 1 tablet by mouth daily.   calcium carbonate 750 MG chewable tablet Commonly known as: TUMS EX Chew 1-2 tablets by mouth 3 (three) times daily as needed (for heartburn/indigestion).   cefdinir 300 MG capsule Commonly known as: OMNICEF SMARTSIG:1 Capsule(s) By Mouth   Centrum Silver 50+Women Tabs Take 1 tablet by mouth daily.   Imbruvica 420 MG Tabs Generic drug: ibrutinib TAKE 1 TABLET (420 MG) BY MOUTH DAILY.   Magnesium Oxide 250 MG Tabs Take 250 mg by mouth daily.   oxybutynin 5 MG tablet Commonly known as: DITROPAN Take 5 mg by mouth 3 (three) times daily.   phenazopyridine 200 MG tablet Commonly known as: PYRIDIUM Take 200 mg by mouth 3 (three) times daily as needed.   Potassium 99 MG Tabs Take 99 mg by mouth daily.   tamoxifen 20 MG tablet Commonly known as: NOLVADEX Take 1 tablet (20 mg total) by mouth daily.        Allergies:  Allergies  Allergen Reactions   Oxycodone Other (See Comments)    Can't sleep, feels bad when taking.    Family History: Family History  Problem Relation Age of Onset   Breast cancer Sister 31   Lung cancer Mother 69       mets to breast   Lung cancer Father 37   Heart attack Brother    Breast cancer Maternal Aunt        age unknown   Stroke Sister 30    Social History:  reports that she quit smoking about 52 years ago. Her smoking use included cigarettes. She has a 2.50 pack-year smoking  history. She has never used smokeless tobacco. She reports that she does not currently use alcohol. She reports that she does not use drugs.  ROS:  Physical Exam: BP (!) 162/84   Pulse 67   Ht 5\' 2"  (1.575 m)   Wt 70.3 kg   BMI 28.35 kg/m   Constitutional:  Alert and oriented, No acute distress. HEENT: [place] AT, moist mucus membranes.  Trachea midline, no masses. Cardiovascular: No clubbing, cyanosis, or edema. Respiratory: Normal respiratory effort, no increased work of breathing. GI: Abdomen is soft, nontender, nondistended, no abdominal masses GU: On pelvic examination patient had a grade 3 cystocele with central defect that reached the introitus and went beyond 1 or 2 cm.  She had a long vaginal vault that was capacious.  I did not see the vaginal cuff.  The vaginal cuff descended from 8 or 9 cm to approximately 6 cm.  She had a diffuse mild grade 2 rectocele but minimal central defect.  She had some deficiency of the posterior fourchette.  When I felt for the ischial spine her length was longer than it.  She did not have stress incontinence but likely leaked a few drops from overactivity. Skin: No rashes, bruises or suspicious lesions. Lymph: No cervical or inguinal adenopathy. Neurologic: Grossly intact, no focal deficits, moving all 4 extremities. Psychiatric: Normal mood and affect.  Laboratory Data: Lab Results  Component Value Date   WBC 6.3 09/10/2021   HGB 11.8 (L) 09/10/2021   HCT 36.0 09/10/2021   MCV 90.7 09/10/2021   PLT 128 (L) 09/10/2021    Lab Results  Component Value Date   CREATININE 0.85 09/10/2021    No results found for: PSA  No results found for: TESTOSTERONE  No results found for: HGBA1C  Urinalysis    Component Value Date/Time   COLORURINE YELLOW (A) 09/10/2021 1819   APPEARANCEUR CLOUDY (A) 09/10/2021 1819   LABSPEC 1.012 09/10/2021 1819   PHURINE 5.0 09/10/2021 1819   GLUCOSEU  NEGATIVE 09/10/2021 1819   HGBUR NEGATIVE 09/10/2021 1819   BILIRUBINUR NEGATIVE 09/10/2021 1819   KETONESUR NEGATIVE 09/10/2021 1819   PROTEINUR NEGATIVE 09/10/2021 1819   NITRITE POSITIVE (A) 09/10/2021 1819   LEUKOCYTESUR LARGE (A) 09/10/2021 1819    Pertinent Imaging: Urine reviewed.  Urine sent for culture.  Chart reviewed.  She was scanned for 451 mL  Assessment & Plan: Patient has symptomatic prolapse.  She has elevated residuals.  She has urge incontinence and may have stress incontinence.  Her flow symptoms vary.  The role of urinary prophylaxis discussed.  The role of teaching clean intermittent catheterization discussed.  The role of urodynamics discussed.  Her inability to tolerate the catheter may have been from the infection.  Picture was drawn.  If she had surgery a robotic repair probably would be the best due to vaginal length.  Role of pessary due to age and medical comorbidities discussed.  Patient would like to be held without surgery.  Hopefully we can keep her urine sterile she will be much more comfortable.  I am not convinced that her umbilical discomfort improved when we catheterized her.  She does have an umbilical hernia apparently.  I called in trimethoprim 100 mg 3x11.  She does not want surgery at this stage and we will not be ordering urodynamics unless her treatment pathway changes.  She will see gynecology hopefully can be fitted for a pessary.  She understands that if she is comfortable on trimethoprim with high residuals that she does not need surgery and could be followed conservatively  1. Recurrent UTI  - Urinalysis, Complete - CULTURE, URINE COMPREHENSIVE  2. Urinary retention  -  BLADDER SCAN AMB NON-IMAGING   No follow-ups on file.  [name], MD  [facility] [address] [place], [place] [address] [phone]

## 2021-09-17 ENCOUNTER — Other Ambulatory Visit (HOSPITAL_COMMUNITY): Payer: Self-pay

## 2021-09-17 LAB — URINALYSIS, COMPLETE
Bilirubin, UA: NEGATIVE
Glucose, UA: NEGATIVE
Ketones, UA: NEGATIVE
Leukocytes,UA: NEGATIVE
Nitrite, UA: NEGATIVE
Protein,UA: NEGATIVE
RBC, UA: NEGATIVE
Specific Gravity, UA: 1.01 (ref 1.005–1.030)
Urobilinogen, Ur: 0.2 mg/dL (ref 0.2–1.0)
pH, UA: 6 (ref 5.0–7.5)

## 2021-09-17 LAB — MICROSCOPIC EXAMINATION
Bacteria, UA: NONE SEEN
Epithelial Cells (non renal): >10 /hpf — ABNORMAL HIGH (ref 0–10)
RBC, Urine: NONE SEEN /hpf (ref 0–2)

## 2021-09-18 ENCOUNTER — Other Ambulatory Visit: Payer: Self-pay

## 2021-09-18 ENCOUNTER — Ambulatory Visit: Payer: Medicare HMO | Admitting: Obstetrics & Gynecology

## 2021-09-18 ENCOUNTER — Encounter: Payer: Self-pay | Admitting: Obstetrics & Gynecology

## 2021-09-18 VITALS — BP 120/80 | Ht 63.0 in | Wt 156.0 lb

## 2021-09-18 DIAGNOSIS — N819 Female genital prolapse, unspecified: Secondary | ICD-10-CM | POA: Diagnosis not present

## 2021-09-18 DIAGNOSIS — N8110 Cystocele, unspecified: Secondary | ICD-10-CM

## 2021-09-18 DIAGNOSIS — N3946 Mixed incontinence: Secondary | ICD-10-CM | POA: Diagnosis not present

## 2021-09-18 DIAGNOSIS — N390 Urinary tract infection, site not specified: Secondary | ICD-10-CM | POA: Diagnosis not present

## 2021-09-18 DIAGNOSIS — N8111 Cystocele, midline: Secondary | ICD-10-CM

## 2021-09-18 DIAGNOSIS — Z87898 Personal history of other specified conditions: Secondary | ICD-10-CM

## 2021-09-18 NOTE — Progress Notes (Signed)
Cystocele/Rectocele Patient is a 87 yo G3P3 WF who complains of symptoms related to cystocele and vaginal prolapse . Problem started 2 months ago, with acute urinary retention and noted worsening pelvic organ prolapse.  Prior to that she has noted stress incontinence and increased urge and frequency to void, but not to the point of investigating therapy or options.  She has h/o 3 childbirths.  She has had a chronic cough.  She has not done much lifting over the last decade.  Symptoms include: prolapse of tissue with straining and urinary retention requiring cathaterizations at times, and recurrent UTI over the last few mos.  Symptoms have gradually worsened.   PMHx: She  has a past medical history of Arthritis, Breast cancer ([place]) (2007), Breast cancer ([place]) (03/31/2017), Family history of adverse reaction to anesthesia, GERD (gastroesophageal reflux disease), Heart murmur, Lumbar radiculitis, Lumbar stenosis, Personal history of chemotherapy (2018), Personal history of radiation therapy (2007), and Personal history of radiation therapy (2018). Also,  has a past surgical history that includes Abdominal hysterectomy; Breast surgery (Right); Back surgery; Eye surgery; Fracture surgery (Left, 2015 or 2016); Tumor excision (2001); Breast lumpectomy (Right, 2007); Breast lumpectomy (Left, 04/23/2017); Breast lumpectomy (Left, 04/23/2017); Breast biopsy (Bilateral, 03/31/2017); Breast biopsy (Right, 03/31/2017); Breast excisional biopsy (Left, 2018); Colonoscopy; Simple mastectomy with axillary sentinel node biopsy (Left, 02/25/2021); and Incision and drainage abscess (Left, 03/27/2021)., family history includes Breast cancer in her maternal aunt; Breast cancer (age of onset: 23) in her sister; Heart attack in her brother; Lung cancer (age of onset: 12) in her father; Lung cancer (age of onset: 89) in her mother; Stroke (age of onset: 57) in her sister.,  reports that she quit smoking about 52 years ago. Her smoking  use included cigarettes. She has a 2.50 pack-year smoking history. She has never used smokeless tobacco. She reports that she does not currently use alcohol. She reports that she does not use drugs.  She has a current medication list which includes the following prescription(s): acetaminophen, albuterol, calcium carbonate, cefdinir, imbruvica, magnesium oxide, multiple minerals-vitamins, centrum silver 50+women, oxybutynin, phenazopyridine, potassium, tamoxifen, and trimethoprim. Also, is allergic to oxycodone.  Review of Systems  Constitutional:  Negative for chills, fever and malaise/fatigue.  HENT:  Negative for congestion, sinus pain and sore throat.   Eyes:  Negative for blurred vision and pain.  Respiratory:  Negative for cough and wheezing.   Cardiovascular:  Negative for chest pain and leg swelling.  Gastrointestinal:  Positive for constipation and diarrhea. Negative for abdominal pain, heartburn, nausea and vomiting.  Genitourinary:  Positive for dysuria, frequency and urgency. Negative for hematuria.  Musculoskeletal:  Negative for back pain, joint pain, myalgias and neck pain.  Skin:  Negative for itching and rash.  Neurological:  Negative for dizziness, tremors and weakness.  Endo/Heme/Allergies:  Does not bruise/bleed easily.  Psychiatric/Behavioral:  Negative for depression. The patient is not nervous/anxious and does not have insomnia.    Objective: BP 120/80   Ht 5\' 3"  (1.6 m)   Wt 156 lb (70.8 kg)   BMI 27.63 kg/m  Physical Exam Constitutional:      General: She is not in acute distress.    Appearance: She is well-developed.  Genitourinary:     Bladder and urethral meatus normal.     Genitourinary Comments: Cuff intact/ no lesions  Absent uterus and cervix     Vaginal cuff intact.    No vaginal erythema or bleeding.     Apical vaginal prolapse present.  Mild vaginal atrophy present.    Vaginal exam comments: Mild distal prolapse, no eversion No vag erosions.       Right Adnexa: not tender and no mass present.    Left Adnexa: not tender and no mass present.    Cervix is absent.     Uterus is absent.     Bladder exam comments: Gr 3 Cystocele Active/mobile w valsalva >30 degree q tip test.     Pelvic exam was performed with patient in the lithotomy position.  HENT:     Head: Normocephalic and atraumatic.     Nose: Nose normal.  Abdominal:     General: There is no distension.     Palpations: Abdomen is soft.     Tenderness: There is no abdominal tenderness.  Musculoskeletal:        General: Normal range of motion.  Neurological:     Mental Status: She is alert and oriented to person, place, and time.     Cranial Nerves: No cranial nerve deficit.  Skin:    General: Skin is warm and dry.  Psychiatric:        Attention and Perception: Attention normal.        Mood and Affect: Mood normal.        Speech: Speech normal.        Behavior: Behavior normal.        Cognition and Memory: Cognition normal.        Judgment: Judgment normal.    ASSESSMENT/PLAN:   Problem List Items Addressed This Visit       Genitourinary   Mixed urinary incontinence due to female genital prolapse   Cystocele, midline - Primary   Vaginal prolapse   Other Visit Diagnoses     Recurrent UTI       History of urinary retention          Pessary Fitting Patient presents for a pessary fitting. She desires a pessary as her means of controlling her symptoms of prolapse and/or urinary incontinence. She understands the care needed for a pessary and desires to proceed. Alternative treatment options have been discussed at length and the patient voices an understanding of each option.   PROCEDURE: The patient was placed in dorsal lithotomy position. Examination confirmed prolapse. A #4 ring type pessary model was fitted without difficulty. The patient subsequently ambulated, voided and performed valsalva maneuvers without dislodging the pessary and without discomfort.   She did note LOU w cough w pessary in place. Care instructions were provided for future use of pessary. Pessary model removed and real pessary ordered.  Patient was discharged to home in stable condition.   Will order and place upon arrival.  Due to having GSI, will order w knob (incontinence ring).   Barnett Applebaum, MD, [facility], [facility] 09/18/2021  1:52 PM

## 2021-09-18 NOTE — Patient Instructions (Signed)
How to Use a Vaginal Pessary A vaginal pessary is a removable device that is placed into your vagina to support pelvic organs that droop. These organs include your uterus, bladder, and rectum. When your pelvic organs drop down into your vagina, it causes a condition called pelvic organ prolapse (POP). A pessary may be an alternative to surgery for women with POP. It may help women who leak urine when they strain or exercise (stress incontinence). This is a symptom of POP. A vaginal pessary may also be a temporary treatment for stress incontinence during pregnancy. There are several types of pessaries. All types are usually made of silicone. You can insert and remove some on your own. Other types must be inserted and removed by your health care provider at office visits. The reason you are using a pessary and the severity of your condition will determine which one is best for you. It is also important to find the right size. A pessary that is too small may fall out. A pessary that is too large may cause pain or discomfort. Your health care provider will do a physical exam to find the correct size and fit for your pessary. It may take several appointments to find the best fit for you. If you can be fit with the type of pessary that you can insert, remove, and clean yourself, your health care provider will teach you how to use your pessary at home. You may have checkups every few months. If you have the type of pessary that needs to be inserted and removed by your health care provider, you will have appointments every few months to have the pessary removed, cleaned, and replaced. What are the risks? When properly fitted and cared for, risks of using a vaginal pessary can be small. However, there can be problems that may include: Vaginal discharge. Vaginal bleeding. A bad smell coming from your vagina. Scraping of the skin inside your vagina. How to use your pessary Follow your health care provider's  instructions for using a pessary. These instructions may vary, depending on the type of pessary you have. To insert a pessary: Wash your hands with soap and water for at least 20 seconds. Squeeze or fold the pessary in half and lubricate the tip with a water-based lubricant. Insert the pessary into your vagina. It will unfold and provide support. To remove the pessary, gently tug it out of your vagina. You can remove the pessary every night or after several days. You can also remove it to have sex. How to care for your pessary If you have a pessary that you can remove: Clean your pessary with soap and water. Rinse well. Dry it completely before inserting it back into your vagina. Follow these instructions at home: Take over-the-counter and prescription medicines only as told by your health care provider. Your health care provider may prescribe an estrogen cream to moisten your vagina. Keep all follow-up visits. This is important. Contact a health care provider if: You feel any pain or discomfort when your pessary is in place. You continue to have stress incontinence. You have trouble keeping your pessary from falling out. You have an unusual vaginal discharge that is blood-tinged or smells bad. Summary A vaginal pessary is a removable device that is placed into your vagina to support pelvic organs that droop. This condition is called pelvic organ prolapse (POP). There are several types of pessaries. Some you can insert and remove on your own. Others must be inserted and removed  by your health care provider. The best type for you depends on the reason you are using a pessary and the severity of your condition. It is also important to find the right size. If you can use the type that you insert and remove on your own, your health care provider will teach you how to use it and schedule checkups every few months. If you have the type that needs to be inserted and removed by your health care  provider, you will have regular appointments to have your pessary removed, cleaned, and replaced. This information is not intended to replace advice given to you by your health care provider. Make sure you discuss any questions you have with your health care provider. Document Revised: 05/31/2020 Document Reviewed: 05/31/2020 Else[employer] Patient Education  [place].

## 2021-09-20 LAB — CULTURE, URINE COMPREHENSIVE

## 2021-09-23 ENCOUNTER — Telehealth: Payer: Self-pay

## 2021-09-23 ENCOUNTER — Other Ambulatory Visit (HOSPITAL_COMMUNITY): Payer: Self-pay

## 2021-09-23 MED ORDER — NITROFURANTOIN MONOHYD MACRO 100 MG PO CAPS
100.0000 mg | ORAL_CAPSULE | Freq: Two times a day (BID) | ORAL | 0 refills | Status: DC
  Filled 2021-09-23: qty 14, 7d supply, fill #0

## 2021-09-23 MED ORDER — NITROFURANTOIN MONOHYD MACRO 100 MG PO CAPS: 100.0000 mg | ORAL_CAPSULE | Freq: Two times a day (BID) | ORAL | 0 refills | Status: DC

## 2021-09-23 NOTE — Telephone Encounter (Signed)
Pt is currently taking omnicef. Should she continue that or should I call in [place].

## 2021-09-23 NOTE — Telephone Encounter (Addendum)
As per Dr.MacDairmid have pt DC omnicef and start Macrobid. Left detailed message for daughter. Macrodantin sent in to pharmacy.

## 2021-09-23 NOTE — Addendum Note (Signed)
Addended by: Alvera Novel on: 09/23/2021 03:14 PM   Modules accepted: Orders

## 2021-09-23 NOTE — Telephone Encounter (Signed)
-----   Message from Bjorn Loser, MD sent at 09/23/2021  8:15 AM EDT ----- Macrodantin 100 mg twice a day for 7 days Then go back on daily trimethoprim ----- Message ----- From: Alvera Novel, CMA Sent: 09/20/2021   1:00 PM EDT To: Bjorn Loser, MD   ----- Message ----- From: Interface, [facility] Lab Results In Sent: 09/17/2021   5:37 AM EDT To: [name]

## 2021-09-23 NOTE — Telephone Encounter (Signed)
Pt's daughter, [name] called back to let you know pt started Omnicef on 9/30 and only took one per day til 10/4, then realized it should have been 2 times per day, so that's why it wasn't adding up right.  She only has 2 pills left and she will be done.  She would like for you to give her a call back when you hear from dr.

## 2021-09-24 ENCOUNTER — Other Ambulatory Visit: Payer: Self-pay

## 2021-09-24 ENCOUNTER — Ambulatory Visit
Admission: RE | Admit: 2021-09-24 | Discharge: 2021-09-24 | Disposition: A | Discharge status: Home / Self Care | Payer: Medicare HMO | Source: Ambulatory Visit | Attending: General Surgery | Admitting: General Surgery

## 2021-09-24 DIAGNOSIS — Z1231 Encounter for screening mammogram for malignant neoplasm of breast: Secondary | ICD-10-CM | POA: Insufficient documentation

## 2021-09-26 ENCOUNTER — Encounter: Payer: Self-pay | Admitting: Obstetrics & Gynecology

## 2021-09-26 ENCOUNTER — Ambulatory Visit: Payer: Medicare HMO | Admitting: Obstetrics & Gynecology

## 2021-09-26 ENCOUNTER — Other Ambulatory Visit: Payer: Self-pay

## 2021-09-26 VITALS — BP 130/80 | Ht 62.0 in | Wt 156.0 lb

## 2021-09-26 DIAGNOSIS — R338 Other retention of urine: Secondary | ICD-10-CM

## 2021-09-26 DIAGNOSIS — N8111 Cystocele, midline: Secondary | ICD-10-CM | POA: Diagnosis not present

## 2021-09-26 DIAGNOSIS — N8110 Cystocele, unspecified: Secondary | ICD-10-CM | POA: Diagnosis not present

## 2021-09-26 DIAGNOSIS — N819 Female genital prolapse, unspecified: Secondary | ICD-10-CM

## 2021-09-26 DIAGNOSIS — N3946 Mixed incontinence: Secondary | ICD-10-CM

## 2021-09-26 NOTE — Progress Notes (Signed)
HPI:      [name] is a 87 y.o. G3P3 who presents today for her pessary placement related to her pelvic floor weakening.  Pt reports continued urinary retention w partial voids and relief.  Plan is for pessary to help relieve kinking and retention.  Pessary fitting suggested at #4 size.  As she had incontinence w cough w pessary trial, we ordered an incontinence ring.  PMHx: She  has a past medical history of Arthritis, Breast cancer ([place]) (2007), Breast cancer ([place]) (03/31/2017), Family history of adverse reaction to anesthesia, GERD (gastroesophageal reflux disease), Heart murmur, Lumbar radiculitis, Lumbar stenosis, Personal history of chemotherapy (2018), Personal history of radiation therapy (2007), and Personal history of radiation therapy (2018). Also,  has a past surgical history that includes Abdominal hysterectomy; Breast surgery (Right); Back surgery; Eye surgery; Fracture surgery (Left, 2015 or 2016); Tumor excision (2001); Breast lumpectomy (Right, 2007); Breast lumpectomy (Left, 04/23/2017); Breast lumpectomy (Left, 04/23/2017); Breast biopsy (Bilateral, 03/31/2017); Breast biopsy (Right, 03/31/2017); Breast excisional biopsy (Left, 2018); Colonoscopy; Simple mastectomy with axillary sentinel node biopsy (Left, 02/25/2021); Incision and drainage abscess (Left, 03/27/2021); and Mastectomy (Left, 02/24/2021)., family history includes Breast cancer in her maternal aunt; Breast cancer (age of onset: 40) in her sister; Heart attack in her brother; Lung cancer (age of onset: 35) in her father; Lung cancer (age of onset: 66) in her mother; Stroke (age of onset: 10) in her sister.,  reports that she quit smoking about 52 years ago. Her smoking use included cigarettes. She has a 2.50 pack-year smoking history. She has never used smokeless tobacco. She reports that she does not currently use alcohol. She reports that she does not use drugs.  She has a current medication list which includes the  following prescription(s): acetaminophen, albuterol, calcium carbonate, imbruvica, magnesium oxide, multiple minerals-vitamins, centrum silver 50+women, nitrofurantoin (macrocrystal-monohydrate), oxybutynin, phenazopyridine, potassium, tamoxifen, and trimethoprim. Also, is allergic to oxycodone.  Review of Systems  All other systems reviewed and are negative.  Objective: BP 130/80   Ht 5\' 2"  (1.575 m)   Wt 156 lb (70.8 kg)   BMI 28.53 kg/m  Physical Exam Constitutional:      General: She is not in acute distress.    Appearance: She is well-developed.  Genitourinary:     Bladder and urethral meatus normal.     Genitourinary Comments: Cuff intact/ no lesions  Absent uterus and cervix     Vaginal cuff intact.    No vaginal erythema or bleeding.     Apical vaginal prolapse present.    Mild vaginal atrophy present.     Right Adnexa: not tender and no mass present.    Left Adnexa: not tender and no mass present.    Cervix is absent.     Uterus is absent.     Bladder exam comments: Gr 3 cystocele.     Pelvic exam was performed with patient in the lithotomy position.  HENT:     Head: Normocephalic and atraumatic.     Nose: Nose normal.  Abdominal:     General: There is no distension.     Palpations: Abdomen is soft.     Tenderness: There is no abdominal tenderness.  Musculoskeletal:        General: Normal range of motion.  Neurological:     Mental Status: She is alert and oriented to person, place, and time.     Cranial Nerves: No cranial nerve deficit.  Skin:    General: Skin is  warm and dry.  Psychiatric:        Attention and Perception: Attention normal.        Mood and Affect: Mood normal.        Speech: Speech normal.        Behavior: Behavior normal.        Cognition and Memory: Cognition normal.        Judgment: Judgment normal.   A/P:   ICD-10-CM   1. Cystocele, midline  N81.11     2. Vaginal prolapse  N81.10     3. Mixed urinary incontinence due to female  genital prolapse  N39.46    N81.9     4. Acute urinary retention  R33.8     Incont Ring #4- expulsion Incont Ring #6-  tolerates well so far Instructions given for care. Concerning symptoms to observe for are counseled to patient. Follow up scheduled for 3 months.  A total of 20 minutes were spent face-to-face with the patient as well as preparation, review, communication, and documentation during this encounter.   Barnett Applebaum, MD, [facility], [facility] 09/26/2021  3:38 PM

## 2021-09-30 ENCOUNTER — Other Ambulatory Visit: Payer: Self-pay

## 2021-09-30 ENCOUNTER — Ambulatory Visit: Payer: Medicare HMO | Admitting: Obstetrics & Gynecology

## 2021-09-30 ENCOUNTER — Encounter: Payer: Self-pay | Admitting: Obstetrics & Gynecology

## 2021-09-30 VITALS — BP 120/80 | Ht 62.0 in | Wt 157.0 lb

## 2021-09-30 DIAGNOSIS — N819 Female genital prolapse, unspecified: Secondary | ICD-10-CM | POA: Diagnosis not present

## 2021-09-30 DIAGNOSIS — N8111 Cystocele, midline: Secondary | ICD-10-CM

## 2021-09-30 DIAGNOSIS — N3946 Mixed incontinence: Secondary | ICD-10-CM | POA: Diagnosis not present

## 2021-09-30 NOTE — Progress Notes (Signed)
  HPI:      [name] is a 87 y.o. G3P3 who presents today for her pessary concerns related to her pelvic floor weakening, an Incontinence Ring #6 was placed 3 days ago.  Pt reports tolerating the pessary well with no vaginal bleeding and no vaginal discharge, but she has had urinary incontinence all weekend; some back pain as well.  No dysuria or burning or odor w urine.\.  Symptoms of pelvic floor weakening have greatly improved, as she is having no urinary retention (Which was her biggest concern for getting the pessary).   PMHx: She  has a past medical history of Arthritis, Breast cancer ([place]) (2007), Breast cancer ([place]) (03/31/2017), Family history of adverse reaction to anesthesia, GERD (gastroesophageal reflux disease), Heart murmur, Lumbar radiculitis, Lumbar stenosis, Personal history of chemotherapy (2018), Personal history of radiation therapy (2007), and Personal history of radiation therapy (2018). Also,  has a past surgical history that includes Abdominal hysterectomy; Breast surgery (Right); Back surgery; Eye surgery; Fracture surgery (Left, 2015 or 2016); Tumor excision (2001); Breast lumpectomy (Right, 2007); Breast lumpectomy (Left, 04/23/2017); Breast lumpectomy (Left, 04/23/2017); Breast biopsy (Bilateral, 03/31/2017); Breast biopsy (Right, 03/31/2017); Breast excisional biopsy (Left, 2018); Colonoscopy; Simple mastectomy with axillary sentinel node biopsy (Left, 02/25/2021); Incision and drainage abscess (Left, 03/27/2021); and Mastectomy (Left, 02/24/2021)., family history includes Breast cancer in her maternal aunt; Breast cancer (age of onset: 16) in her sister; Heart attack in her brother; Lung cancer (age of onset: 82) in her father; Lung cancer (age of onset: 75) in her mother; Stroke (age of onset: 104) in her sister.,  reports that she quit smoking about 52 years ago. Her smoking use included cigarettes. She has a 2.50 pack-year smoking history. She has never used smokeless  tobacco. She reports that she does not currently use alcohol. She reports that she does not use drugs.  She has a current medication list which includes the following prescription(s): acetaminophen, albuterol, calcium carbonate, imbruvica, magnesium oxide, multiple minerals-vitamins, centrum silver 50+women, nitrofurantoin (macrocrystal-monohydrate), oxybutynin, phenazopyridine, potassium, tamoxifen, and trimethoprim. Also, is allergic to oxycodone.  Review of Systems  All other systems reviewed and are negative.  Objective: BP 120/80   Ht 5\' 2"  (1.575 m)   Wt 157 lb (71.2 kg)   BMI 28.72 kg/m  Physical Exam Constitutional:      General: She is not in acute distress.    Appearance: She is well-developed.  Musculoskeletal:        General: Normal range of motion.  Neurological:     Mental Status: She is alert and oriented to person, place, and time.  Skin:    General: Skin is warm and dry.  Vitals reviewed.   Pessary Care Pessary in proper alignment and seems optimal size (not too big nor small), knob for ring in proper place.  No vaginal bleeding or ulcerations.  A/P:1. Cystocele, midline 2. Mixed urinary incontinence due to female genital prolapse Cont pessary and monitor pain and incontinence Both may improve w time Retention problem has resolved, which was the more pressing diagnosis Can consider change to alternative type of pessary Surgery option as well (urology or urogyne) Bladder training skills discussed  A total of 20 minutes were spent face-to-face with the patient as well as preparation, review, communication, and documentation during this encounter.   Barnett Applebaum, MD, [facility], [facility] 09/30/2021  1:58 PM

## 2021-10-08 ENCOUNTER — Other Ambulatory Visit (HOSPITAL_COMMUNITY): Payer: Self-pay

## 2021-10-16 ENCOUNTER — Other Ambulatory Visit (HOSPITAL_COMMUNITY): Payer: Self-pay

## 2021-10-21 ENCOUNTER — Encounter: Payer: Self-pay | Admitting: Urology

## 2021-10-24 NOTE — Progress Notes (Signed)
Megan Santana  Telephone:([phone] Fax:(336) [phone]  ID: Elissa Lovett OB: 1934/02/25  MR#: [id number]  YKD#:[id number]  Patient Care Team: Lynnell Jude, MD as PCP - General (Family Medicine) Rico Junker, RN as Oncology Nurse Navigator Lloyd Huger, MD as Consulting Physician (Oncology) Clemmie Krill Lynnell Jude, MD as Referring Physician (Family Medicine) Bary Castilla, Forest Gleason, MD (General Surgery) Noreene Filbert, MD as Referring Physician (Radiation Oncology)   CHIEF COMPLAINT:  CLL, q13 deletion.  DCIS left breast  INTERVAL HISTORY: Patient returns to clinic today for repeat laboratory work and routine 18-monthevaluation.  Her urogynecology issues have significant improved.  She otherwise feels well.  She does not complain of any pain today.  She continues to tolerate Imbruvica and tamoxifen without significant side effects. She has no neurologic complaints. She denies any recent fevers or illnesses.  She denies any night sweats or unintentional weight loss.  She has no chest pain, shortness of breath, cough, or hemoptysis.  She denies any nausea, vomiting, constipation, or diarrhea. She has no urinary complaints.  Patient offers no further specific complaints today.  REVIEW OF SYSTEMS:   Review of Systems  Constitutional: Negative.  Negative for fever, malaise/fatigue and weight loss.  Respiratory: Negative.  Negative for cough, hemoptysis and shortness of breath.   Cardiovascular: Negative.  Negative for chest pain and leg swelling.  Gastrointestinal: Negative.  Negative for abdominal pain and constipation.  Genitourinary: Negative.  Negative for dysuria and flank pain.  Musculoskeletal: Negative.  Negative for back pain and joint pain.  Skin: Negative.  Negative for rash.  Neurological: Negative.  Negative for sensory change, focal weakness, weakness and headaches.  Psychiatric/Behavioral: Negative.  The patient is not nervous/anxious and does not have  insomnia.    As per HPI. Otherwise, a complete review of systems is negative.  PAST MEDICAL HISTORY: Past Medical History:  Diagnosis Date   Arthritis    [place]   [place]   [place]   16 mm invasive mammary carcinoma, left upper outer quadrant, T1c, N0, triple negative, histologic grade 3.   Family history of adverse reaction to anesthesia    mom n/v   GERD (gastroesophageal reflux disease)    Heart murmur    Lumbar radiculitis    Lumbar stenosis    Personal history of chemotherapy 2018   Left   Personal history of radiation therapy 2007   Right   Personal history of radiation therapy 2018   Left    PAST SURGICAL HISTORY: Past Surgical History:  Procedure Laterality Date   ABDOMINAL HYSTERECTOMY     total   BACK SURGERY     lumbar   BREAST BIOPSY Bilateral 03/31/2017   bilat u/s core INVASIVE MAMMARY CARCINOMA   BREAST BIOPSY Right 03/31/2017   DENSE FIBROSIS AND SHEETS OF MACROPHAGES   BREAST EXCISIONAL BIOPSY Left 2018   BREAST LUMPECTOMY Right 2007   BREAST LUMPECTOMY Left 04/23/2017   BREAST LUMPECTOMY Left 04/23/2017   Procedure: BREAST LUMPECTOMY WITH EXCISION OF SENTINEL NODE;  Surgeon: BRobert Bellow MD;  Whole breast radiation ORS;  Service: General;  Laterality: Left;   BREAST SURGERY Right    Wide excision   COLONOSCOPY     EYE SURGERY     cataracts bil   FRACTURE SURGERY Left 2015 or 2016   INCISION AND DRAINAGE ABSCESS Left 03/27/2021   Procedure: debridement left chest wall and seroma drainage;  Surgeon: BRobert Bellow MD;  Location: [facility] ORS;  Service: General;  Laterality: Left;   MASTECTOMY Left 02/24/2021   SIMPLE MASTECTOMY WITH AXILLARY SENTINEL NODE BIOPSY Left 02/25/2021   Procedure: SIMPLE MASTECTOMY WITH AXILLARY SENTINEL NODE BIOPSY;  Surgeon: Robert Bellow, MD;  Location: [facility] ORS;  Service: General;  Laterality: Left;   TUMOR EXCISION  2001    FAMILY HISTORY: Family  History  Problem Relation Age of Onset   Breast cancer Sister 53   Lung cancer Mother 108       mets to breast   Lung cancer Father 76   Heart attack Brother    Breast cancer Maternal Aunt        age unknown   Stroke Sister 43    ADVANCED DIRECTIVES (Y/N):  N  HEALTH MAINTENANCE: Social History   Tobacco Use   Smoking status: Former    Packs/day: 0.25    Years: 10.00    Pack years: 2.50    Types: Cigarettes    Quit date: 1970    Years since quitting: 52.9   Smokeless tobacco: Never  Vaping Use   Vaping Use: Never used  Substance Use Topics   Alcohol use: Not Currently   Drug use: No     Colonoscopy:  PAP:  Bone density:  Lipid panel:  Allergies  Allergen Reactions   Oxycodone Other (See Comments)    Can't sleep, feels bad when taking.    Current Outpatient Medications  Medication Sig Dispense Refill   acetaminophen (TYLENOL) 500 MG tablet Take 1,000 mg by mouth every 6 (six) hours as needed (for pain.).     albuterol (VENTOLIN HFA) 108 (90 Base) MCG/ACT inhaler Inhale 2 puffs into the lungs every 4 (four) hours as needed.     calcium carbonate (TUMS EX) 750 MG chewable tablet Chew 1-2 tablets by mouth 3 (three) times daily as needed (for heartburn/indigestion).     ibrutinib (IMBRUVICA) 420 MG TABS TAKE 1 TABLET (420 MG) BY MOUTH DAILY. 28 tablet 3   Magnesium Oxide 250 MG TABS Take 250 mg by mouth daily.      Multiple Minerals-Vitamins (CAL MAG ZINC +D3 PO) Take 1 tablet by mouth daily.     Multiple Vitamins-Minerals (CENTRUM SILVER 50+WOMEN) TABS Take 1 tablet by mouth daily.     nitrofurantoin, macrocrystal-monohydrate, (MACROBID) 100 MG capsule Take 1 capsule (100 mg total) by mouth every 12 (twelve) hours. 14 capsule 0   oxybutynin (DITROPAN) 5 MG tablet Take 5 mg by mouth 3 (three) times daily.     phenazopyridine (PYRIDIUM) 200 MG tablet Take 200 mg by mouth 3 (three) times daily as needed.     Potassium 99 MG TABS Take 99 mg by mouth daily.       tamoxifen (NOLVADEX) 20 MG tablet Take 1 tablet (20 mg total) by mouth daily. 90 tablet 3   trimethoprim (TRIMPEX) 100 MG tablet Take 1 tablet (100 mg total) by mouth daily. 30 tablet 11   No current facility-administered medications for this visit.    OBJECTIVE: Vitals:   10/31/21 1409  BP: 133/80  Pulse: 71  Resp: 20  Temp: 97.8 F (36.6 C)  SpO2: 100%      Body mass index is 28.94 kg/m.    ECOG FS:0 - Asymptomatic  General: Well-developed, well-nourished, no acute distress. Eyes: Pink conjunctiva, anicteric sclera. HEENT: Normocephalic, moist mucous membranes. Lungs: No audible wheezing or coughing. Heart: Regular rate and rhythm. Abdomen: Soft, nontender, no obvious distention. Musculoskeletal: No edema, cyanosis, or clubbing.  Neuro: Alert, answering all questions appropriately. Cranial nerves grossly intact. Skin: No rashes or petechiae noted. Psych: Normal affect.    LAB RESULTS:  Lab Results  Component Value Date   NA 136 10/31/2021   K 4.5 10/31/2021   CL 104 10/31/2021   CO2 24 10/31/2021   GLUCOSE 91 10/31/2021   BUN 22 10/31/2021   CREATININE 1.03 (H) 10/31/2021   CALCIUM 8.7 (L) 10/31/2021   PROT 6.6 10/31/2021   ALBUMIN 3.9 10/31/2021   AST 21 10/31/2021   ALT 18 10/31/2021   ALKPHOS 40 10/31/2021   BILITOT 0.2 (L) 10/31/2021   GFRNONAA 53 (L) 10/31/2021   GFRAA >60 09/10/2020    Lab Results  Component Value Date   WBC 9.0 10/31/2021   NEUTROABS 4.7 10/31/2021   HGB 11.8 (L) 10/31/2021   HCT 36.0 10/31/2021   MCV 90.2 10/31/2021   PLT 152 10/31/2021     STUDIES: No results found.  ASSESSMENT:  CLL, q13 deletion.  DCIS left breast  PLAN:    1. CLL, q13 deletion: CT scan results from February 04, 2021 reviewed independently with essential resolution of extensive lymphadenopathy throughout the chest, abdomen, pelvis. Patient initiated 420 mg Imbruvica in October 2021 after a reaction to Rituxan.  Treatment was discontinued temporarily  surrounding her breast surgery.  Patient is now back on Imbruvica and her white count is now within normal limits.  Repeat imaging on July 29, 2021 reviewed independently and reported as above significant improvement of patient's lymphadenopathy in her chest, abdomen, and pelvis.  She has no splenomegaly.  Continue Imbruvica as prescribed.  Return to clinic in 3 months with repeat laboratory work and further evaluation.  2.  Left breast DCIS: Patient underwent total mastectomy on February 25, 2021 confirming diagnosis.  She does not require adjuvant XRT.  Continue tamoxifen for total 5 years completing treatment in May 2027.  3.  History of pathologic stage IB triple negative invasive carcinoma of the upper outer quadrant of the left breast: Patient's previous breast cancer was in her right breast and was ER/PR positive. This was likely a second primary.  Patient completed 4 cycles of Taxotere and Cytoxan August 06, 2017.  Also completed adjuvant XRT.  An aromatase inhibitor would not be of any benefit given the ER/PR status of her tumor.   3. 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 Oncotype score 17: Originally diagnosed in 2008. Patient completed 5 years of Arimidex in approximately August 2013. Biopsy of the right breast only revealed fat necrosis at the site of her previous MammoSite.  Mammogram and possible biopsy as above. 4. Genetic testing: Patient has a sister and mother both with breast cancer. 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 5.  Anxiety: Chronic and unchanged.  Continue Xanax as needed. 6.  Anemia: Essentially resolved.  Patient's hemoglobin is 11.8. 7.  Thrombocytopenia: Resolved. 8.  Urogynecology: Continue treatment and follow-up per gynecology and urology.  Patient expressed understanding and was in agreement with this plan. She also understands that She can call clinic  at any time with any questions, concerns, or complaints.    Cancer Staging  CLL (chronic lymphocytic leukemia) ([place]) Staging form: Chronic Lymphocytic Leukemia / Small Lymphocytic Lymphoma, AJCC 8th Edition - Clinical stage from 03/14/2021: Modified Rai Stage I (Modified Rai risk: Intermediate, Lymphocytosis: Present, Adenopathy: Present, Organomegaly: Absent, Anemia: Absent, Thrombocytopenia: Absent) - Signed by Grayland Ormond,  Kathlene November, MD on 03/14/2021 Stage prefix: Initial diagnosis  Carcinoma of upper-outer quadrant of left breast in female, estrogen receptor negative ([place]) Staging form: Breast, AJCC 8th Edition - Clinical stage from 04/08/2017: Stage IB (cT1c, cN0, cM0, G3, ER-, PR-, HER2-) - Signed by Lloyd Huger, MD on 04/08/2017 Histologic grading system: 3 grade system Laterality: Right - Pathologic stage from 05/11/2017: Stage IB (pT1c, pN0, cM0, G3, ER-, PR-, HER2-) - Signed by Lloyd Huger, MD on 05/11/2017 Neoadjuvant therapy: No Histologic grading system: 3 grade system Laterality: Left  Ductal carcinoma in situ (DCIS) of left breast Staging form: Breast, AJCC 8th Edition - Clinical stage from 03/14/2021: Stage 0 (cTis (DCIS), cN0, cM0, ER+, PR+, HER2-) - Signed by Lloyd Huger, MD on 03/14/2021 Nuclear grade: Patrecia Pour, MD   11/01/2021 12:22 PM

## 2021-10-30 ENCOUNTER — Other Ambulatory Visit: Payer: Self-pay | Admitting: Emergency Medicine

## 2021-10-30 DIAGNOSIS — C9110 Chronic lymphocytic leukemia of B-cell type not having achieved remission: Secondary | ICD-10-CM

## 2021-10-31 ENCOUNTER — Inpatient Hospital Stay: Payer: Medicare HMO | Attending: Oncology

## 2021-10-31 ENCOUNTER — Other Ambulatory Visit: Payer: Self-pay

## 2021-10-31 ENCOUNTER — Encounter: Payer: Self-pay | Admitting: Oncology

## 2021-10-31 ENCOUNTER — Inpatient Hospital Stay (HOSPITAL_BASED_OUTPATIENT_CLINIC_OR_DEPARTMENT_OTHER): Payer: Medicare HMO | Admitting: Oncology

## 2021-10-31 VITALS — BP 133/80 | HR 71 | Temp 97.8°F | Resp 20 | Wt 158.2 lb

## 2021-10-31 DIAGNOSIS — F419 Anxiety disorder, unspecified: Secondary | ICD-10-CM | POA: Diagnosis not present

## 2021-10-31 DIAGNOSIS — Z79810 Long term (current) use of selective estrogen receptor modulators (SERMs): Secondary | ICD-10-CM | POA: Diagnosis not present

## 2021-10-31 DIAGNOSIS — C9110 Chronic lymphocytic leukemia of B-cell type not having achieved remission: Secondary | ICD-10-CM | POA: Insufficient documentation

## 2021-10-31 DIAGNOSIS — Z923 Personal history of irradiation: Secondary | ICD-10-CM | POA: Insufficient documentation

## 2021-10-31 DIAGNOSIS — Z853 Personal history of malignant neoplasm of breast: Secondary | ICD-10-CM | POA: Diagnosis not present

## 2021-10-31 DIAGNOSIS — D0512 Intraductal carcinoma in situ of left breast: Secondary | ICD-10-CM | POA: Insufficient documentation

## 2021-10-31 DIAGNOSIS — Z87891 Personal history of nicotine dependence: Secondary | ICD-10-CM | POA: Diagnosis not present

## 2021-10-31 DIAGNOSIS — Z8249 Family history of ischemic heart disease and other diseases of the circulatory system: Secondary | ICD-10-CM | POA: Diagnosis not present

## 2021-10-31 DIAGNOSIS — Z803 Family history of malignant neoplasm of breast: Secondary | ICD-10-CM | POA: Diagnosis not present

## 2021-10-31 DIAGNOSIS — Z801 Family history of malignant neoplasm of trachea, bronchus and lung: Secondary | ICD-10-CM | POA: Insufficient documentation

## 2021-10-31 DIAGNOSIS — Z9221 Personal history of antineoplastic chemotherapy: Secondary | ICD-10-CM | POA: Insufficient documentation

## 2021-10-31 DIAGNOSIS — Z79899 Other long term (current) drug therapy: Secondary | ICD-10-CM | POA: Diagnosis not present

## 2021-10-31 LAB — CBC WITH DIFFERENTIAL/PLATELET
Abs Immature Granulocytes: 0.09 10*3/uL — ABNORMAL HIGH (ref 0.00–0.07)
Basophils Absolute: 0.1 10*3/uL (ref 0.0–0.1)
Basophils Relative: 1 %
Eosinophils Absolute: 0.2 10*3/uL (ref 0.0–0.5)
Eosinophils Relative: 2 %
HCT: 36 % (ref 36.0–46.0)
Hemoglobin: 11.8 g/dL — ABNORMAL LOW (ref 12.0–15.0)
Immature Granulocytes: 1 %
Lymphocytes Relative: 37 %
Lymphs Abs: 3.3 10*3/uL (ref 0.7–4.0)
MCH: 29.6 pg (ref 26.0–34.0)
MCHC: 32.8 g/dL (ref 30.0–36.0)
MCV: 90.2 fL (ref 80.0–100.0)
Monocytes Absolute: 0.6 10*3/uL (ref 0.1–1.0)
Monocytes Relative: 7 %
Neutro Abs: 4.7 10*3/uL (ref 1.7–7.7)
Neutrophils Relative %: 52 %
Platelets: 152 10*3/uL (ref 150–400)
RBC: 3.99 MIL/uL (ref 3.87–5.11)
RDW: 13.2 % (ref 11.5–15.5)
WBC: 9 10*3/uL (ref 4.0–10.5)
nRBC: 0 % (ref 0.0–0.2)

## 2021-10-31 LAB — COMPREHENSIVE METABOLIC PANEL
ALT: 18 U/L (ref 0–44)
AST: 21 U/L (ref 15–41)
Albumin: 3.9 g/dL (ref 3.5–5.0)
Alkaline Phosphatase: 40 U/L (ref 38–126)
Anion gap: 8 (ref 5–15)
BUN: 22 mg/dL (ref 8–23)
CO2: 24 mmol/L (ref 22–32)
Calcium: 8.7 mg/dL — ABNORMAL LOW (ref 8.9–10.3)
Chloride: 104 mmol/L (ref 98–111)
Creatinine, Ser: 1.03 mg/dL — ABNORMAL HIGH (ref 0.44–1.00)
GFR, Estimated: 53 mL/min — ABNORMAL LOW (ref >60)
Glucose, Bld: 91 mg/dL (ref 70–99)
Potassium: 4.5 mmol/L (ref 3.5–5.1)
Sodium: 136 mmol/L (ref 135–145)
Total Bilirubin: 0.2 mg/dL — ABNORMAL LOW (ref 0.3–1.2)
Total Protein: 6.6 g/dL (ref 6.5–8.1)

## 2021-10-31 LAB — MAGNESIUM: Magnesium: 1.9 mg/dL (ref 1.7–2.4)

## 2021-11-11 ENCOUNTER — Other Ambulatory Visit (HOSPITAL_COMMUNITY): Payer: Self-pay

## 2021-11-13 ENCOUNTER — Other Ambulatory Visit (HOSPITAL_COMMUNITY): Payer: Self-pay

## 2021-11-15 NOTE — Telephone Encounter (Signed)
See previous note from 07/22/21, signing note

## 2021-11-18 ENCOUNTER — Ambulatory Visit: Payer: Medicare HMO | Admitting: Urology

## 2021-11-25 ENCOUNTER — Other Ambulatory Visit: Payer: Self-pay

## 2021-11-25 ENCOUNTER — Ambulatory Visit: Payer: Medicare HMO | Admitting: Urology

## 2021-11-25 ENCOUNTER — Encounter: Payer: Self-pay | Admitting: Urology

## 2021-11-25 VITALS — BP 159/57 | HR 50 | Ht 62.0 in | Wt 158.0 lb

## 2021-11-25 DIAGNOSIS — N390 Urinary tract infection, site not specified: Secondary | ICD-10-CM

## 2021-11-25 DIAGNOSIS — R339 Retention of urine, unspecified: Secondary | ICD-10-CM | POA: Diagnosis not present

## 2021-11-25 LAB — MICROSCOPIC EXAMINATION

## 2021-11-25 LAB — URINALYSIS, COMPLETE
Bilirubin, UA: NEGATIVE
Glucose, UA: NEGATIVE
Leukocytes,UA: NEGATIVE
Nitrite, UA: NEGATIVE
Protein,UA: NEGATIVE
RBC, UA: NEGATIVE
Specific Gravity, UA: 1.02 (ref 1.005–1.030)
Urobilinogen, Ur: 0.2 mg/dL (ref 0.2–1.0)
pH, UA: 5.5 (ref 5.0–7.5)

## 2021-11-25 LAB — BLADDER SCAN AMB NON-IMAGING: Scan Result: 10

## 2021-11-25 MED ORDER — TRIMETHOPRIM 100 MG PO TABS: 100.0000 mg | ORAL_TABLET | Freq: Every day | ORAL | 11 refills | Status: DC

## 2021-11-25 MED ORDER — MIRABEGRON ER 50 MG PO TB24: 50.0000 mg | ORAL_TABLET | Freq: Every day | ORAL | 11 refills | Status: DC

## 2021-11-25 NOTE — Progress Notes (Signed)
11/25/2021 10:11 AM   Megan Santana Jun 20, 1934 [id number]  Referring provider: Lynnell Jude, MD [address] [place],  [place] [address]  No chief complaint on file.   HPI: I was consulted to assess the patient for prolapse and incomplete bladder emptying.  At the end of September she went to the emergency room and was catheterized for 400 mL.  She has been told she has a cystocele and has had a hysterectomy.  She was catheterized for 400 mL.  She was given Keflex.  She has been treated 2 or 3 times in the last several weeks or a few months with antibiotics.  She is currently on Omnicef and had a positive culture  I do believe she feels vaginal bulging and it can spontaneously reduce some.  Her flow is sometimes good sometimes poor.  Sometimes she feels empty and other times she does not.   She has had an in and out catheterization 2 or 3 times.  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  She can go from a sitting to standing position and leak.  She does not cough and sneeze and denies stress incontinence and bedwetting. she can feel urgency and then cannot urinate.  She was a bit nonspecific but I think generally wears 1 or 2 pads a day but in the last 2 months it is more often but variable.  She has had back surgery.  She is alternating constipation and diarrhea.  She has had radiation and chemotherapy and is chronic lymphocytic lymphoma.  No new nodules on recent CT scan.  CT scan August 01, 2021 demonstrated no hydronephrosis   In March she had this year she was diagnosed and had surgery for breast cancer left side.  Then she had an infection.  She said to surgery and does not want more surgery   She states he felt uncomfortable especially when she stands near the umbilicus.  On pelvic examination patient had a grade 3 cystocele with central  defect that reached the introitus and went beyond 1 or 2 cm.  She had a long vaginal vault that was capacious.  I did not see the vaginal cuff.  The vaginal cuff descended from 8 or 9 cm to approximately 6 cm.  She had a diffuse mild grade 2 rectocele but minimal central defect.  She had some deficiency of the posterior fourchette.  When I felt for the ischial spine her length was longer than it.  She did not have stress incontinence but likely leaked a few drops from overactivity.  Patient has symptomatic prolapse.  She has elevated residuals.  She has urge incontinence and may have stress incontinence.  Her flow symptoms vary.  The role of urinary prophylaxis discussed.  The role of teaching clean intermittent catheterization discussed.  The role of urodynamics discussed.  Her inability to tolerate the catheter may have been from the infection.  Picture was drawn.  If she had surgery a robotic repair probably would be the best due to vaginal length.  Role of pessary due to age and medical comorbidities discussed.   Patient would like to be helped without surgery.  Hopefully we can keep her urine sterile she will be much more comfortable.  I am not convinced that her umbilical discomfort improved when we catheterized her.  She does have  an umbilical hernia apparently.  I called in trimethoprim 100 mg 3x11.  She does not want surgery at this stage and we will not be ordering urodynamics unless her treatment pathway changes.  She will see gynecology hopefully can be fitted for a pessary.  She understands that if she is comfortable on trimethoprim with high residuals that she does not need surgery and could be followed conservatively  Today On review of the medical record patient has been fitted with a pessary and was doing quite well.  She saw gynecology in October 2022 and her medical oncologist in November 2022.  She did have a positive culture i the last time I saw her.  She is feeling good and very  comfortable with the pessary.  Clinically not infected and on trimethoprim renewed.  Now she is leaking more.  She is not complaining a lot of urge incontinence but was having some foot on the floor syndrome improving.  She leaks with no warning.  She leaks when she coughs or sneezes.  No bedwetting.  She is using a double pad system and she can change it multiple times a day       PMH: Past Medical History:  Diagnosis Date   Arthritis    Breast cancer ([facility]) 2007   right lumpectomy   Breast cancer ([facility]) 03/31/2017   16 mm invasive mammary carcinoma, left upper outer quadrant, T1c, N0, triple negative, histologic grade 3.   Family history of adverse reaction to anesthesia    mom n/v   GERD (gastroesophageal reflux disease)    Heart murmur    Lumbar radiculitis    Lumbar stenosis    Personal history of chemotherapy 2018   Left   Personal history of radiation therapy 2007   Right   Personal history of radiation therapy 2018   Left    Surgical History: Past Surgical History:  Procedure Laterality Date   ABDOMINAL HYSTERECTOMY     total   BACK SURGERY     lumbar   BREAST BIOPSY Bilateral 03/31/2017   bilat u/s core INVASIVE MAMMARY CARCINOMA   BREAST BIOPSY Right 03/31/2017   DENSE FIBROSIS AND SHEETS OF MACROPHAGES   BREAST EXCISIONAL BIOPSY Left 2018   BREAST LUMPECTOMY Right 2007   BREAST LUMPECTOMY Left 04/23/2017   BREAST LUMPECTOMY Left 04/23/2017   Procedure: BREAST LUMPECTOMY WITH EXCISION OF SENTINEL NODE;  Surgeon: Robert Bellow, MD;  Whole breast radiation ORS;  Service: General;  Laterality: Left;   BREAST SURGERY Right    Wide excision   COLONOSCOPY     EYE SURGERY     cataracts bil   FRACTURE SURGERY Left 2015 or 2016   INCISION AND DRAINAGE ABSCESS Left 03/27/2021   Procedure: debridement left chest wall and seroma drainage;  Surgeon: Robert Bellow, MD;  Location: [facility] ORS;  Service: General;  Laterality: Left;   MASTECTOMY Left 02/24/2021    SIMPLE MASTECTOMY WITH AXILLARY SENTINEL NODE BIOPSY Left 02/25/2021   Procedure: SIMPLE MASTECTOMY WITH AXILLARY SENTINEL NODE BIOPSY;  Surgeon: Robert Bellow, MD;  Location: [facility] ORS;  Service: General;  Laterality: Left;   TUMOR EXCISION  2001    Home Medications:  Allergies as of 11/25/2021       Reactions   Oxycodone Other (See Comments)   Can't sleep, feels bad when taking.        Medication List        Accurate as of November 25, 2021 10:11 AM. If you have  any questions, ask your nurse or doctor.          acetaminophen 500 MG tablet Commonly known as: TYLENOL Take 1,000 mg by mouth every 6 (six) hours as needed (for pain.).   albuterol 108 (90 Base) MCG/ACT inhaler Commonly known as: VENTOLIN HFA Inhale 2 puffs into the lungs every 4 (four) hours as needed.   CAL MAG ZINC +D3 PO Take 1 tablet by mouth daily.   calcium carbonate 750 MG chewable tablet Commonly known as: TUMS EX Chew 1-2 tablets by mouth 3 (three) times daily as needed (for heartburn/indigestion).   Centrum Silver 50+Women Tabs Take 1 tablet by mouth daily.   Imbruvica 420 MG Tabs Generic drug: ibrutinib TAKE 1 TABLET (420 MG) BY MOUTH DAILY.   Magnesium Oxide 250 MG Tabs Take 250 mg by mouth daily.   nitrofurantoin (macrocrystal-monohydrate) 100 MG capsule Commonly known as: MACROBID Take 1 capsule (100 mg total) by mouth every 12 (twelve) hours.   oxybutynin 5 MG tablet Commonly known as: DITROPAN Take 5 mg by mouth 3 (three) times daily.   phenazopyridine 200 MG tablet Commonly known as: PYRIDIUM Take 200 mg by mouth 3 (three) times daily as needed.   Potassium 99 MG Tabs Take 99 mg by mouth daily.   tamoxifen 20 MG tablet Commonly known as: NOLVADEX Take 1 tablet (20 mg total) by mouth daily.   trimethoprim 100 MG tablet Commonly known as: TRIMPEX Take 1 tablet (100 mg total) by mouth daily.        Allergies:  Allergies  Allergen Reactions   Oxycodone Other  (See Comments)    Can't sleep, feels bad when taking.    Family History: Family History  Problem Relation Age of Onset   Breast cancer Sister 84   Lung cancer Mother 8       mets to breast   Lung cancer Father 16   Heart attack Brother    Breast cancer Maternal Aunt        age unknown   Stroke Sister 52    Social History:  reports that she quit smoking about 52 years ago. Her smoking use included cigarettes. She has a 2.50 pack-year smoking history. She has never used smokeless tobacco. She reports that she does not currently use alcohol. She reports that she does not use drugs.  ROS:                                        Physical Exam: There were no vitals taken for this visit.  Constitutional:  Alert and oriented, No acute distress. HEENT:  AT, moist mucus membranes.  Trachea midline, no masses.   Laboratory Data: Lab Results  Component Value Date   WBC 9.0 10/31/2021   HGB 11.8 (L) 10/31/2021   HCT 36.0 10/31/2021   MCV 90.2 10/31/2021   PLT 152 10/31/2021    Lab Results  Component Value Date   CREATININE 1.03 (H) 10/31/2021    No results found for: PSA  No results found for: TESTOSTERONE  No results found for: HGBA1C  Urinalysis    Component Value Date/Time   COLORURINE YELLOW (A) 09/10/2021 1819   APPEARANCEUR Clear 09/16/2021 1515   LABSPEC 1.012 09/10/2021 1819   PHURINE 5.0 09/10/2021 1819   GLUCOSEU Negative 09/16/2021 [place] 09/10/2021 1819   BILIRUBINUR Negative 09/16/2021 [place] 09/10/2021 1819  PROTEINUR Negative 09/16/2021 [place] 09/10/2021 1819   NITRITE Negative 09/16/2021 1515   NITRITE POSITIVE (A) 09/10/2021 1819   LEUKOCYTESUR Negative 09/16/2021 1515   LEUKOCYTESUR LARGE (A) 09/10/2021 1819    Pertinent Imaging:   Assessment & Plan: Patient was having overactive bladder and urge incontinence symptoms prior to pessary.  She now may have mixed  incontinence but is worse.  If we get a urine tail sent for culture.  Stay on trimethoprim.  Reassess in 7 weeks on Myrbetriq 50 mg samples and prescription.  Hopefully we can greatly improve her continence without needing to offer anything invasive.  Overall she is happy that she does not need a catheter and is a lot more comfortable  There are no diagnoses linked to this encounter.  No follow-ups on file.  [name], MD  [facility] [address], [place], [place] [address] [phone]

## 2021-11-25 NOTE — Addendum Note (Signed)
Addended by: Verlene Mayer A on: 11/25/2021 10:41 AM   Modules accepted: Orders

## 2021-11-25 NOTE — Addendum Note (Signed)
Addended by: Verlene Mayer A on: 11/25/2021 10:37 AM   Modules accepted: Orders

## 2021-11-30 LAB — CULTURE, URINE COMPREHENSIVE

## 2021-12-02 ENCOUNTER — Telehealth: Payer: Self-pay

## 2021-12-02 MED ORDER — CIPROFLOXACIN HCL 250 MG PO TABS: 250.0000 mg | ORAL_TABLET | Freq: Two times a day (BID) | ORAL | 0 refills | Status: AC

## 2021-12-02 NOTE — Telephone Encounter (Signed)
-----   Message from Bjorn Loser, MD sent at 12/02/2021  3:49 PM EST ----- Ciprofloxacin 250 mg twice a day for 7 days Then restart daily trimethoprim ----- Message ----- From: Alvera Novel, CMA Sent: 12/02/2021   8:19 AM EST To: Bjorn Loser, MD   ----- Message ----- From: Interface, [facility] Lab Results In Sent: 11/25/2021   4:36 PM EST To: [name]

## 2021-12-02 NOTE — Telephone Encounter (Signed)
Called all three numbers on file. Left detailed message. [facility] [place] . I did speak with Jordie,

## 2021-12-05 ENCOUNTER — Other Ambulatory Visit (HOSPITAL_COMMUNITY): Payer: Self-pay

## 2021-12-10 ENCOUNTER — Other Ambulatory Visit (HOSPITAL_COMMUNITY): Payer: Self-pay

## 2021-12-23 ENCOUNTER — Telehealth: Payer: Self-pay | Admitting: Urology

## 2021-12-23 MED ORDER — OXYBUTYNIN CHLORIDE ER 10 MG PO TB24: 10.0000 mg | ORAL_TABLET | Freq: Every day | ORAL | 11 refills | Status: DC

## 2021-12-23 NOTE — Telephone Encounter (Signed)
Sw daughter [name] . Medication called into pharmacy.

## 2021-12-23 NOTE — Telephone Encounter (Signed)
Pts daughter [name] called in and said the medication Myrbetriq is not working and that Dr. Matilde Sprang said if it does not work to let him know and they could try something different. She wants to know if she should get the Myrbetriq refilled another month and see if anything changes or get something different from Dr. Matilde Sprang. She would like a call back today if possible. Her number is [phone].

## 2022-01-03 ENCOUNTER — Other Ambulatory Visit (HOSPITAL_COMMUNITY): Payer: Self-pay

## 2022-01-03 ENCOUNTER — Other Ambulatory Visit: Payer: Self-pay | Admitting: Oncology

## 2022-01-03 DIAGNOSIS — C9110 Chronic lymphocytic leukemia of B-cell type not having achieved remission: Secondary | ICD-10-CM

## 2022-01-04 ENCOUNTER — Other Ambulatory Visit (HOSPITAL_COMMUNITY): Payer: Self-pay

## 2022-01-04 MED ORDER — IMBRUVICA 420 MG PO TABS
ORAL_TABLET | ORAL | 3 refills | Status: DC
  Filled 2022-01-04: qty 28, 28d supply, fill #0
  Filled 2022-02-06: qty 28, 28d supply, fill #1
  Filled 2022-02-28: qty 28, 28d supply, fill #2
  Filled 2022-03-28: qty 28, 28d supply, fill #3

## 2022-01-06 ENCOUNTER — Other Ambulatory Visit (HOSPITAL_COMMUNITY): Payer: Self-pay

## 2022-01-08 ENCOUNTER — Other Ambulatory Visit (HOSPITAL_COMMUNITY): Payer: Self-pay

## 2022-01-09 ENCOUNTER — Ambulatory Visit: Payer: Medicare HMO | Admitting: Obstetrics & Gynecology

## 2022-01-09 ENCOUNTER — Other Ambulatory Visit: Payer: Self-pay

## 2022-01-09 ENCOUNTER — Encounter: Payer: Self-pay | Admitting: Obstetrics & Gynecology

## 2022-01-09 VITALS — BP 140/80 | Ht 62.0 in | Wt 164.0 lb

## 2022-01-09 DIAGNOSIS — N3946 Mixed incontinence: Secondary | ICD-10-CM

## 2022-01-09 DIAGNOSIS — N8111 Cystocele, midline: Secondary | ICD-10-CM | POA: Diagnosis not present

## 2022-01-09 DIAGNOSIS — N819 Female genital prolapse, unspecified: Secondary | ICD-10-CM | POA: Diagnosis not present

## 2022-01-09 DIAGNOSIS — N8110 Cystocele, unspecified: Secondary | ICD-10-CM | POA: Diagnosis not present

## 2022-01-09 NOTE — Progress Notes (Signed)
HPI:      [name] is a 88 y.o. G3P3 who presents today for her pessary follow up and examination related to her pelvic floor weakening.  Pt reports tolerating the pessary well with  no vaginal bleeding and  no vaginal discharge.  Symptoms of pelvic floor weakening have greatly improved.  Has incontinence some but no urinary retention any longer. She is voiding and defecating without difficulty. She currently has a Research scientist (physical sciences) #6 pessary.  PMHx: She  has a past medical history of Arthritis, Breast cancer ([facility]) (2007), Breast cancer ([facility]) (03/31/2017), Family history of adverse reaction to anesthesia, GERD (gastroesophageal reflux disease), Heart murmur, Lumbar radiculitis, Lumbar stenosis, Personal history of chemotherapy (2018), Personal history of radiation therapy (2007), and Personal history of radiation therapy (2018). Also,  has a past surgical history that includes Abdominal hysterectomy; Breast surgery (Right); Back surgery; Eye surgery; Fracture surgery (Left, 2015 or 2016); Tumor excision (2001); Breast lumpectomy (Right, 2007); Breast lumpectomy (Left, 04/23/2017); Breast lumpectomy (Left, 04/23/2017); Breast biopsy (Bilateral, 03/31/2017); Breast biopsy (Right, 03/31/2017); Breast excisional biopsy (Left, 2018); Colonoscopy; Simple mastectomy with axillary sentinel node biopsy (Left, 02/25/2021); Incision and drainage abscess (Left, 03/27/2021); and Mastectomy (Left, 02/24/2021)., family history includes Breast cancer in her maternal aunt; Breast cancer (age of onset: 47) in her sister; Heart attack in her brother; Lung cancer (age of onset: 3) in her father; Lung cancer (age of onset: 59) in her mother; Stroke (age of onset: 15) in her sister.,  reports that she quit smoking about 53 years ago. Her smoking use included cigarettes. She has a 2.50 pack-year smoking history. She has never used smokeless tobacco. She reports that she does not currently use alcohol. She reports that she  does not use drugs.  She has a current medication list which includes the following prescription(s): acetaminophen, albuterol, calcium carbonate, imbruvica, magnesium oxide, multiple minerals-vitamins, centrum silver 50+women, oxybutynin, phenazopyridine, potassium, tamoxifen, and trimethoprim. Also, is allergic to oxycodone.  Review of Systems  All other systems reviewed and are negative.  Objective: BP 140/80    Ht 5\' 2"  (1.575 m)    Wt 164 lb (74.4 kg)    BMI 30.00 kg/m  Physical Exam Constitutional:      General: She is not in acute distress.    Appearance: She is well-developed.  Genitourinary:     Bladder and urethral meatus normal.     Genitourinary Comments: Cuff intact/ no lesions  Absent uterus and cervix     Vaginal cuff intact.    No vaginal erythema or bleeding.      Right Adnexa: not tender and no mass present.    Left Adnexa: not tender and no mass present.    Cervix is absent.     Uterus is absent.     Pelvic exam was performed with patient in the lithotomy position.  HENT:     Head: Normocephalic and atraumatic.     Nose: Nose normal.  Abdominal:     General: There is no distension.     Palpations: Abdomen is soft.     Tenderness: There is no abdominal tenderness.  Musculoskeletal:        General: Normal range of motion.  Neurological:     Mental Status: She is alert and oriented to person, place, and time.     Cranial Nerves: No cranial nerve deficit.  Skin:    General: Skin is warm and dry.  Psychiatric:        Attention  and Perception: Attention normal.        Mood and Affect: Mood normal.        Speech: Speech normal.        Behavior: Behavior normal.        Cognition and Memory: Cognition normal.        Judgment: Judgment normal.    Pessary Care Pessary removed and cleaned.  Vagina checked - without erosions - pessary replaced.  A/P:   ICD-10-CM   1. Cystocele, midline  N81.11     2. Mixed urinary incontinence due to female genital  prolapse  N39.46    N81.9     3. Vaginal prolapse  N81.10      Pessary was cleaned and replaced today. Instructions given for care. Concerning symptoms to observe for are counseled to patient. Follow up scheduled for 3 months.  A total of 20 minutes were spent face-to-face with the patient as well as preparation, review, communication, and documentation during this encounter.   Barnett Applebaum, MD, [facility], [facility] 01/09/2022  11:12 AM

## 2022-01-20 ENCOUNTER — Ambulatory Visit: Payer: Medicare HMO | Admitting: Urology

## 2022-01-20 ENCOUNTER — Other Ambulatory Visit: Payer: Self-pay

## 2022-01-20 VITALS — BP 156/91 | HR 60 | Ht 62.0 in | Wt 162.0 lb

## 2022-01-20 DIAGNOSIS — N390 Urinary tract infection, site not specified: Secondary | ICD-10-CM | POA: Diagnosis not present

## 2022-01-20 DIAGNOSIS — N3946 Mixed incontinence: Secondary | ICD-10-CM

## 2022-01-20 MED ORDER — GEMTESA 75 MG PO TABS: 75.0000 mg | ORAL_TABLET | Freq: Every day | ORAL | 11 refills | Status: AC

## 2022-01-20 NOTE — Progress Notes (Signed)
01/20/2022 12:57 PM   Megan Santana 27-Sep-1934 [id number]  Referring provider: Lynnell Jude, MD [address] [place],  [place] [address]  No chief complaint on file.   HPI: I was consulted to assess the patient for prolapse and incomplete bladder emptying.  At the end of September she went to the emergency room and was catheterized for 400 mL.  She has been told she has a cystocele and has had a hysterectomy.  She was catheterized for 400 mL.  She was given Keflex.  She has been treated 2 or 3 times in the last several weeks or a few months with antibiotics.  She is currently on Omnicef and had a positive culture  I do believe she feels vaginal bulging and it can spontaneously reduce some.  Her flow is sometimes good sometimes poor.  Sometimes she feels empty and other times she does not.   She has had an in and out catheterization 2 or 3 times.  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  She can go from a sitting to standing position and leak.  She does not cough and sneeze and denies stress incontinence and bedwetting. she can feel urgency and then cannot urinate.  She was a bit nonspecific but I think generally wears 1 or 2 pads a day but in the last 2 months it is more often but variable.  She has had back surgery.  She is alternating constipation and diarrhea.  She has had radiation and chemotherapy and is chronic lymphocytic lymphoma.  No new nodules on recent CT scan.  CT scan August 01, 2021 demonstrated no hydronephrosis   In March she had this year she was diagnosed and had surgery for breast cancer left side.  Then she had an infection.  She said to surgery and does not want more surgery   She states he felt uncomfortable especially when she stands near the umbilicus.   On pelvic examination patient had a grade 3 cystocele with central  defect that reached the introitus and went beyond 1 or 2 cm.  She had a long vaginal vault that was capacious.  I did not see the vaginal cuff.  The vaginal cuff descended from 8 or 9 cm to approximately 6 cm.  She had a diffuse mild grade 2 rectocele but minimal central defect.  She had some deficiency of the posterior fourchette.  When I felt for the ischial spine her length was longer than it.  She did not have stress incontinence but likely leaked a few drops from overactivity.   Patient has symptomatic prolapse.  She has elevated residuals.  She has urge incontinence and may have stress incontinence.  Her flow symptoms vary.  The role of urinary prophylaxis discussed.  The role of teaching clean intermittent catheterization discussed.  The role of urodynamics discussed.  Her inability to tolerate the catheter may have been from the infection.  Picture was drawn.  If she had surgery a robotic repair probably would be the best due to vaginal length.  Role of pessary due to age and medical comorbidities discussed.   Patient would like to be helped without surgery.  Hopefully we can keep her urine sterile she will be much more comfortable.  I am not convinced that her umbilical discomfort improved when we catheterized her.  She  does have an umbilical hernia apparently.  I called in trimethoprim 100 mg 3x11.  She does not want surgery at this stage and we will not be ordering urodynamics unless her treatment pathway changes.  She will see gynecology hopefully can be fitted for a pessary.  She understands that if she is comfortable on trimethoprim with high residuals that she does not need surgery and could be followed conservatively   Today On review of the medical record patient has been fitted with a pessary and was doing quite well.  She saw gynecology in October 2022 and her medical oncologist in November 2022.  She did have a positive culture i the last time I saw her.   She is feeling good and very  comfortable with the pessary.  Clinically not infected and on trimethoprim renewed.  Now she is leaking more.  She is not complaining a lot of urge incontinence but was having some foot on the floor syndrome improving.  She leaks with no warning.  She leaks when she coughs or sneezes.  No bedwetting.  She is using a double pad system and she can change it multiple times a day    Patient was having overactive bladder and urge incontinence symptoms prior to pessary.  She now may have mixed incontinence but is worse. Stay on trimethoprim.  Reassess in 7 weeks on Myrbetriq 50 mg samples and prescription.  Hopefully we can greatly improve her continence without needing to offer anything invasive.  Overall she is happy that she does not need a catheter and is a lot more comfortable  Today During last visit culture was positive.  Daughter had called in the Myrbetriq was not helping.  Patient continues to have mixed incontinence.  She leaks a lot when she goes from a sitting to standing position and does not necessarily feel any urgency.  She can still soaked 6 pads a day.  On oxybutynin and trimethoprim and clinically not infected.     PMH: Past Medical History:  Diagnosis Date   Arthritis    Breast cancer ([place]) 2007   right lumpectomy   Breast cancer ([place]) 03/31/2017   16 mm invasive mammary carcinoma, left upper outer quadrant, T1c, N0, triple negative, histologic grade 3.   Family history of adverse reaction to anesthesia    mom n/v   GERD (gastroesophageal reflux disease)    Heart murmur    Lumbar radiculitis    Lumbar stenosis    Personal history of chemotherapy 2018   Left   Personal history of radiation therapy 2007   Right   Personal history of radiation therapy 2018   Left    Surgical History: Past Surgical History:  Procedure Laterality Date   ABDOMINAL HYSTERECTOMY     total   BACK SURGERY     lumbar   BREAST BIOPSY Bilateral 03/31/2017   bilat u/s core INVASIVE MAMMARY  CARCINOMA   BREAST BIOPSY Right 03/31/2017   DENSE FIBROSIS AND SHEETS OF MACROPHAGES   BREAST EXCISIONAL BIOPSY Left 2018   BREAST LUMPECTOMY Right 2007   BREAST LUMPECTOMY Left 04/23/2017   BREAST LUMPECTOMY Left 04/23/2017   Procedure: BREAST LUMPECTOMY WITH EXCISION OF SENTINEL NODE;  Surgeon: Robert Bellow, MD;  Whole breast radiation ORS;  Service: General;  Laterality: Left;   BREAST SURGERY Right    Wide excision   COLONOSCOPY     EYE SURGERY     cataracts bil   FRACTURE SURGERY Left 2015 or 2016  INCISION AND DRAINAGE ABSCESS Left 03/27/2021   Procedure: debridement left chest wall and seroma drainage;  Surgeon: Robert Bellow, MD;  Location: [facility] ORS;  Service: General;  Laterality: Left;   MASTECTOMY Left 02/24/2021   SIMPLE MASTECTOMY WITH AXILLARY SENTINEL NODE BIOPSY Left 02/25/2021   Procedure: SIMPLE MASTECTOMY WITH AXILLARY SENTINEL NODE BIOPSY;  Surgeon: Robert Bellow, MD;  Location: [facility] ORS;  Service: General;  Laterality: Left;   TUMOR EXCISION  2001    Home Medications:  Allergies as of 01/20/2022       Reactions   Oxycodone Other (See Comments)   Can't sleep, feels bad when taking.        Medication List        Accurate as of January 20, 2022 12:57 PM. If you have any questions, ask your nurse or doctor.          acetaminophen 500 MG tablet Commonly known as: TYLENOL Take 1,000 mg by mouth every 6 (six) hours as needed (for pain.).   albuterol 108 (90 Base) MCG/ACT inhaler Commonly known as: VENTOLIN HFA Inhale 2 puffs into the lungs every 4 (four) hours as needed.   CAL MAG ZINC +D3 PO Take 1 tablet by mouth daily.   calcium carbonate 750 MG chewable tablet Commonly known as: TUMS EX Chew 1-2 tablets by mouth 3 (three) times daily as needed (for heartburn/indigestion).   Centrum Silver 50+Women Tabs Take 1 tablet by mouth daily.   Imbruvica 420 MG Tabs Generic drug: ibrutinib TAKE 1 TABLET (420 MG) BY MOUTH DAILY.    Magnesium Oxide 250 MG Tabs Take 250 mg by mouth daily.   oxybutynin 10 MG 24 hr tablet Commonly known as: DITROPAN-XL Take 1 tablet (10 mg total) by mouth daily.   phenazopyridine 200 MG tablet Commonly known as: PYRIDIUM Take 200 mg by mouth 3 (three) times daily as needed.   Potassium 99 MG Tabs Take 99 mg by mouth daily.   tamoxifen 20 MG tablet Commonly known as: NOLVADEX Take 1 tablet (20 mg total) by mouth daily.   trimethoprim 100 MG tablet Commonly known as: TRIMPEX Take 1 tablet (100 mg total) by mouth daily.        Allergies:  Allergies  Allergen Reactions   Oxycodone Other (See Comments)    Can't sleep, feels bad when taking.    Family History: Family History  Problem Relation Age of Onset   Breast cancer Sister 44   Lung cancer Mother 24       mets to breast   Lung cancer Father 41   Heart attack Brother    Breast cancer Maternal Aunt        age unknown   Stroke Sister 56    Social History:  reports that she quit smoking about 53 years ago. Her smoking use included cigarettes. She has a 2.50 pack-year smoking history. She has never used smokeless tobacco. She reports that she does not currently use alcohol. She reports that she does not use drugs.  ROS:                                        Physical Exam: There were no vitals taken for this visit.  Constitutional:  Alert and oriented, No acute distress. HEENT: [place] AT, moist mucus membranes.  Trachea midline, no masses.  Laboratory Data: Lab Results  Component Value Date  WBC 9.0 10/31/2021   HGB 11.8 (L) 10/31/2021   HCT 36.0 10/31/2021   MCV 90.2 10/31/2021   PLT 152 10/31/2021    Lab Results  Component Value Date   CREATININE 1.03 (H) 10/31/2021    No results found for: PSA  No results found for: TESTOSTERONE  No results found for: HGBA1C  Urinalysis    Component Value Date/Time   COLORURINE YELLOW (A) 09/10/2021 1819   APPEARANCEUR Clear  11/25/2021 1046   LABSPEC 1.012 09/10/2021 1819   PHURINE 5.0 09/10/2021 1819   GLUCOSEU Negative 11/25/2021 [place] 09/10/2021 1819   BILIRUBINUR Negative 11/25/2021 [place] 09/10/2021 1819   PROTEINUR Negative 11/25/2021 [place] 09/10/2021 1819   NITRITE Negative 11/25/2021 1046   NITRITE POSITIVE (A) 09/10/2021 1819   LEUKOCYTESUR Negative 11/25/2021 1046   LEUKOCYTESUR LARGE (A) 09/10/2021 1819    Pertinent Imaging:   Assessment & Plan: Urine sent for culture.  I have recommended urodynamics and return for cystoscopy and proceed accordingly.  Refractory overactive bladder treatments and a bulking agent may be options for her.  The role of Gemtesa discussed.  Stop oxybutynin.  Call if culture positive.  They might see Dr. Wannetta Sender in the future since her gynecologist locally who fit the pessary is moving.  They will cancel the urodynamics if the Gemtesa works well  There are no diagnoses linked to this encounter.  No follow-ups on file.  [name], MD  [facility] [address], [place], [place] [address] [phone]

## 2022-01-26 LAB — CULTURE, URINE COMPREHENSIVE

## 2022-01-27 ENCOUNTER — Telehealth: Payer: Self-pay | Admitting: *Deleted

## 2022-01-27 MED ORDER — CIPROFLOXACIN HCL 250 MG PO TABS: 250.0000 mg | ORAL_TABLET | Freq: Two times a day (BID) | ORAL | 0 refills | Status: DC

## 2022-01-27 NOTE — Telephone Encounter (Addendum)
Patient's daughter advised, sent in Cipro and given instructions to stop taking Trimethoprim then once completed Cipro start taking again. Voiced understanding.     ----- Message from Bjorn Loser, MD sent at 01/27/2022 11:06 AM EST ----- Please make sure I have not already treated this positive culture If I have not please send in ciprofloxacin 250 mg twice a day for 7 days  ----- Message ----- From: Shanon Ace, CMA Sent: 01/27/2022   8:47 AM EST To: Bjorn Loser, MD   ----- Message ----- From: Interface, [facility] Lab Results In Sent: 01/24/2022   7:38 AM EST To: [name]

## 2022-01-30 ENCOUNTER — Other Ambulatory Visit (HOSPITAL_COMMUNITY): Payer: Self-pay

## 2022-02-03 NOTE — Progress Notes (Signed)
[facility]  [phone] [phone]) [phone]  ID: Megan Santana OB: December 30, 1933  MR#: [id number]  PPJ#:[id number]  Patient Care Team: Lynnell Jude, MD as PCP - General (Family Medicine) Rico Junker, RN as Oncology Nurse Navigator Lloyd Huger, MD as Consulting Physician (Oncology) Clemmie Krill Lynnell Jude, MD as Referring Physician (Family Medicine) Bary Castilla, Forest Gleason, MD (General Surgery) Noreene Filbert, MD as Referring Physician (Radiation Oncology)   CHIEF COMPLAINT:  CLL, q13 deletion.  DCIS left breast  INTERVAL HISTORY: Patient returns to clinic today for repeat laboratory can routine 9-monthevaluation.  She is tolerating Imbruvica and tamoxifen well without significant side effects.  She currently feels well and is asymptomatic.  She has no neurologic complaints. She denies any recent fevers or illnesses.  She denies any night sweats or unintentional weight loss.  She has no chest pain, shortness of breath, cough, or hemoptysis.  She denies any nausea, vomiting, constipation, or diarrhea. She has no urinary complaints.  Patient offers no specific complaints today.  REVIEW OF SYSTEMS:   Review of Systems  Constitutional: Negative.  Negative for fever, malaise/fatigue and weight loss.  Respiratory: Negative.  Negative for cough, hemoptysis and shortness of breath.   Cardiovascular: Negative.  Negative for chest pain and leg swelling.  Gastrointestinal: Negative.  Negative for abdominal pain and constipation.  Genitourinary: Negative.  Negative for dysuria and flank pain.  Musculoskeletal: Negative.  Negative for back pain and joint pain.  Skin: Negative.  Negative for rash.  Neurological: Negative.  Negative for sensory change, focal weakness, weakness and headaches.  Psychiatric/Behavioral: Negative.  The patient is not nervous/anxious and does not have insomnia.    As per HPI. Otherwise, a complete review of systems is negative.  PAST  MEDICAL HISTORY: Past Medical History:  Diagnosis Date   Arthritis    [place]   right lum[place]omy   [place]ast [place] 03/[place]   16 mm invasive mammary carcinoma, left upper outer quadrant, T1c, N0, triple negative, histologic grade 3.   Family history of adverse reaction to anesthesia    mom n/v   GERD (gastroesophageal reflux disease)    Heart murmur    Lumbar radiculitis    Lumbar stenosis    Personal history of chemotherapy 2018   Left   Personal history of radiation therapy 2007   Right   Personal history of radiation therapy 2018   Left    PAST SURGICAL HISTORY: Past Surgical History:  Procedure Laterality Date   ABDOMINAL HYSTERECTOMY     total   BACK SURGERY     lumbar   BREAST BIOPSY Bilateral 03/31/2017   bilat u/s core INVASIVE MAMMARY CARCINOMA   BREAST BIOPSY Right 03/31/2017   DENSE FIBROSIS AND SHEETS OF MACROPHAGES   BREAST EXCISIONAL BIOPSY Left 2018   BREAST LUMPECTOMY Right 2007   BREAST LUMPECTOMY Left 04/23/2017   BREAST LUMPECTOMY Left 04/23/2017   Procedure: BREAST LUMPECTOMY WITH EXCISION OF SENTINEL NODE;  Surgeon: BRobert Bellow MD;  Whole breast radiation ORS;  Service: General;  Laterality: Left;   BREAST SURGERY Right    Wide excision   COLONOSCOPY     EYE SURGERY     cataracts bil   FRACTURE SURGERY Left 2015 or 2016   INCISION AND DRAINAGE ABSCESS Left 03/27/2021   Procedure: debridement left chest wall and seroma drainage;  Surgeon: BRobert Bellow MD;  Location: [facility] ORS;  Service: General;  Laterality: Left;   MASTECTOMY Left 02/24/2021  SIMPLE MASTECTOMY WITH AXILLARY SENTINEL NODE BIOPSY Left 02/25/2021   Procedure: SIMPLE MASTECTOMY WITH AXILLARY SENTINEL NODE BIOPSY;  Surgeon: Robert Bellow, MD;  Location: [facility] ORS;  Service: General;  Laterality: Left;   TUMOR EXCISION  2001    FAMILY HISTORY: Family History  Problem Relation Age of Onset   Breast cancer Sister 80   Lung cancer Mother 29        mets to breast   Lung cancer Father 32   Heart attack Brother    Breast cancer Maternal Aunt        age unknown   Stroke Sister 39    ADVANCED DIRECTIVES (Y/N):  N  HEALTH MAINTENANCE: Social History   Tobacco Use   Smoking status: Former    Packs/day: 0.25    Years: 10.00    Pack years: 2.50    Types: Cigarettes    Quit date: 1970    Years since quitting: 53.1   Smokeless tobacco: Never  Vaping Use   Vaping Use: Never used  Substance Use Topics   Alcohol use: Not Currently   Drug use: No     Colonoscopy:  PAP:  Bone density:  Lipid panel:  Allergies  Allergen Reactions   Oxycodone Other (See Comments)    Can't sleep, feels bad when taking.    Current Outpatient Medications  Medication Sig Dispense Refill   acetaminophen (TYLENOL) 500 MG tablet Take 1,000 mg by mouth every 6 (six) hours as needed (for pain.).     albuterol (VENTOLIN HFA) 108 (90 Base) MCG/ACT inhaler Inhale 2 puffs into the lungs every 4 (four) hours as needed.     calcium carbonate (TUMS EX) 750 MG chewable tablet Chew 1-2 tablets by mouth 3 (three) times daily as needed (for heartburn/indigestion).     ibrutinib (IMBRUVICA) 420 MG tablet TAKE 1 TABLET (420 MG) BY MOUTH DAILY. 28 tablet 3   Magnesium Oxide 250 MG TABS Take 250 mg by mouth daily.      Multiple Minerals-Vitamins (CAL MAG ZINC +D3 PO) Take 1 tablet by mouth daily.     Multiple Vitamins-Minerals (CENTRUM SILVER 50+WOMEN) TABS Take 1 tablet by mouth daily.     Potassium 99 MG TABS Take 99 mg by mouth daily.      tamoxifen (NOLVADEX) 20 MG tablet Take 1 tablet (20 mg total) by mouth daily. 90 tablet 3   trimethoprim (TRIMPEX) 100 MG tablet Take 1 tablet (100 mg total) by mouth daily. 30 tablet 11   Vibegron (GEMTESA) 75 MG TABS Take 75 mg by mouth daily. 30 tablet 11   No current facility-administered medications for this visit.    OBJECTIVE: Vitals:   02/04/22 1434  BP: 135/85  Pulse: 74  Resp: 18  Temp: 99 F (37.2  C)  SpO2: 99%      Body mass index is 30.18 kg/m.    ECOG FS:0 - Asymptomatic  General: Well-developed, well-nourished, no acute distress. Eyes: Pink conjunctiva, anicteric sclera. HEENT: Normocephalic, moist mucous membranes. Breast: Exam deferred today. Lungs: No audible wheezing or coughing. Heart: Regular rate and rhythm. Abdomen: Soft, nontender, no obvious distention. Musculoskeletal: No edema, cyanosis, or clubbing. Neuro: Alert, answering all questions appropriately. Cranial nerves grossly intact. Skin: No rashes or petechiae noted. Psych: Normal affect.  LAB RESULTS:  Lab Results  Component Value Date   NA 136 02/04/2022   K 4.4 02/04/2022   CL 106 02/04/2022   CO2 21 (L) 02/04/2022   GLUCOSE 131 (H) 02/04/2022  BUN 17 02/04/2022   CREATININE 1.10 (H) 02/04/2022   CALCIUM 8.8 (L) 02/04/2022   PROT 6.7 02/04/2022   ALBUMIN 4.0 02/04/2022   AST 22 02/04/2022   ALT 21 02/04/2022   ALKPHOS 39 02/04/2022   BILITOT 0.5 02/04/2022   GFRNONAA 49 (L) 02/04/2022   GFRAA >60 09/10/2020    Lab Results  Component Value Date   WBC 9.5 02/04/2022   NEUTROABS 5.0 02/04/2022   HGB 12.1 02/04/2022   HCT 36.0 02/04/2022   MCV 90.0 02/04/2022   PLT 155 02/04/2022     STUDIES: No results found.  ASSESSMENT:  CLL, q13 deletion.  DCIS left breast  PLAN:    1. CLL, q13 deletion: CT scan results from February 04, 2021 reviewed independently with essential resolution of extensive lymphadenopathy throughout the chest, abdomen, pelvis. Patient initiated 420 mg Imbruvica in October 2021 after a reaction to Rituxan.  Treatment was discontinued temporarily surrounding her breast surgery.  Patient is now back on Imbruvica and her white count is now within normal limits.  Repeat imaging on July 29, 2021 reviewed independently and reported as above significant improvement of patient's lymphadenopathy in her chest, abdomen, and pelvis.  She has no splenomegaly.  No further imaging  is needed unless there is suspicion of progression of disease.  Continue Imbruvica as prescribed.  Return to clinic in 3 months with repeat laboratory and further evaluation.   2.  Left breast DCIS: Patient underwent total mastectomy on February 25, 2021 confirming diagnosis.  She did not require adjuvant XRT.  Patient's most recent right unilateral screening mammogram was on September 24, 2021 and reported as BI-RADS 2.  Continue tamoxifen for total 5 years completing treatment in May 2027.  3.  History of pathologic stage IB triple negative invasive carcinoma of the upper outer quadrant of the left breast: Patient's previous breast cancer was in her right breast and was ER/PR positive. This was likely a second primary.  Patient completed 4 cycles of Taxotere and Cytoxan August 06, 2017.  Also completed adjuvant XRT.  An aromatase inhibitor would not be of any benefit given the ER/PR status of her tumor.   3. 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 Oncotype score 17: Originally diagnosed in 2008. Patient completed 5 years of Arimidex in approximately August 2013. Biopsy of the right breast only revealed fat necrosis at the site of her previous MammoSite.  Mammogram and possible biopsy as above. 4. Genetic testing: Patient has a sister and mother both with breast cancer. 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 5.  Anxiety: Chronic and unchanged.  Continue Xanax as needed. 6.  Anemia: Resolved.   7.  Thrombocytopenia: Resolved. 8.  Urogynecology: Significantly improved.  Continue treatment and follow-up per gynecology and urology.  Patient expressed understanding and was in agreement with this plan. She also understands that She can call clinic at any time with any questions, concerns, or complaints.    Cancer Staging  CLL (chronic lymphocytic leukemia) ([place]) Staging form: Chronic Lymphocytic  Leukemia / Small Lymphocytic Lymphoma, AJCC 8th Edition - Clinical stage from 03/14/2021: Modified Rai Stage I (Modified Rai risk: Intermediate, Lymphocytosis: Present, Adenopathy: Present, Organomegaly: Absent, Anemia: Absent, Thrombocytopenia: Absent) - Signed by Lloyd Huger, MD on 03/14/2021 Stage prefix: Initial diagnosis  Carcinoma of upper-outer quadrant of left breast in female, estrogen receptor negative ([place]) Staging form: Breast, AJCC 8th Edition -  Clinical stage from 04/08/2017: Stage IB (cT1c, cN0, cM0, G3, ER-, PR-, HER2-) - Signed by Lloyd Huger, MD on 04/08/2017 Histologic grading system: 3 grade system Laterality: Right - Pathologic stage from 05/11/2017: Stage IB (pT1c, pN0, cM0, G3, ER-, PR-, HER2-) - Signed by Lloyd Huger, MD on 05/11/2017 Neoadjuvant therapy: No Histologic grading system: 3 grade system Laterality: Left  Ductal carcinoma in situ (DCIS) of left breast Staging form: Breast, AJCC 8th Edition - Clinical stage from 03/14/2021: Stage 0 (cTis (DCIS), cN0, cM0, ER+, PR+, HER2-) - Signed by Lloyd Huger, MD on 03/14/2021 Nuclear grade: Patrecia Pour, MD   02/05/2022 9:45 AM

## 2022-02-04 ENCOUNTER — Other Ambulatory Visit: Payer: Self-pay

## 2022-02-04 ENCOUNTER — Encounter: Payer: Self-pay | Admitting: Oncology

## 2022-02-04 ENCOUNTER — Inpatient Hospital Stay: Payer: Medicare HMO | Attending: Oncology

## 2022-02-04 ENCOUNTER — Inpatient Hospital Stay: Payer: Medicare HMO | Admitting: Oncology

## 2022-02-04 VITALS — BP 135/85 | HR 74 | Temp 99.0°F | Resp 18 | Wt 165.0 lb

## 2022-02-04 DIAGNOSIS — C9110 Chronic lymphocytic leukemia of B-cell type not having achieved remission: Secondary | ICD-10-CM | POA: Insufficient documentation

## 2022-02-04 DIAGNOSIS — D0512 Intraductal carcinoma in situ of left breast: Secondary | ICD-10-CM | POA: Diagnosis not present

## 2022-02-04 DIAGNOSIS — Z79899 Other long term (current) drug therapy: Secondary | ICD-10-CM | POA: Diagnosis not present

## 2022-02-04 DIAGNOSIS — Z79811 Long term (current) use of aromatase inhibitors: Secondary | ICD-10-CM | POA: Insufficient documentation

## 2022-02-04 DIAGNOSIS — C50412 Malignant neoplasm of upper-outer quadrant of left female breast: Secondary | ICD-10-CM | POA: Diagnosis present

## 2022-02-04 DIAGNOSIS — Z170 Estrogen receptor positive status [ER+]: Secondary | ICD-10-CM | POA: Diagnosis not present

## 2022-02-04 DIAGNOSIS — Z9221 Personal history of antineoplastic chemotherapy: Secondary | ICD-10-CM | POA: Insufficient documentation

## 2022-02-04 DIAGNOSIS — F419 Anxiety disorder, unspecified: Secondary | ICD-10-CM | POA: Insufficient documentation

## 2022-02-04 DIAGNOSIS — Z87891 Personal history of nicotine dependence: Secondary | ICD-10-CM | POA: Diagnosis not present

## 2022-02-04 DIAGNOSIS — Z923 Personal history of irradiation: Secondary | ICD-10-CM | POA: Insufficient documentation

## 2022-02-04 LAB — MAGNESIUM: Magnesium: 2.2 mg/dL (ref 1.7–2.4)

## 2022-02-04 LAB — CBC WITH DIFFERENTIAL/PLATELET
Abs Immature Granulocytes: 0 10*3/uL (ref 0.00–0.07)
Basophils Absolute: 0.2 10*3/uL — ABNORMAL HIGH (ref 0.0–0.1)
Basophils Relative: 2 %
Eosinophils Absolute: 0.2 10*3/uL (ref 0.0–0.5)
Eosinophils Relative: 2 %
HCT: 36 % (ref 36.0–46.0)
Hemoglobin: 12.1 g/dL (ref 12.0–15.0)
Lymphocytes Relative: 40 %
Lymphs Abs: 3.8 10*3/uL (ref 0.7–4.0)
MCH: 30.3 pg (ref 26.0–34.0)
MCHC: 33.6 g/dL (ref 30.0–36.0)
MCV: 90 fL (ref 80.0–100.0)
Monocytes Absolute: 0.3 10*3/uL (ref 0.1–1.0)
Monocytes Relative: 3 %
Neutro Abs: 5 10*3/uL (ref 1.7–7.7)
Neutrophils Relative %: 53 %
Platelets: 155 10*3/uL (ref 150–400)
RBC: 4 MIL/uL (ref 3.87–5.11)
RDW: 12.8 % (ref 11.5–15.5)
Smear Review: NORMAL
WBC: 9.5 10*3/uL (ref 4.0–10.5)
nRBC: 0 % (ref 0.0–0.2)

## 2022-02-04 LAB — COMPREHENSIVE METABOLIC PANEL
ALT: 21 U/L (ref 0–44)
AST: 22 U/L (ref 15–41)
Albumin: 4 g/dL (ref 3.5–5.0)
Alkaline Phosphatase: 39 U/L (ref 38–126)
Anion gap: 9 (ref 5–15)
BUN: 17 mg/dL (ref 8–23)
CO2: 21 mmol/L — ABNORMAL LOW (ref 22–32)
Calcium: 8.8 mg/dL — ABNORMAL LOW (ref 8.9–10.3)
Chloride: 106 mmol/L (ref 98–111)
Creatinine, Ser: 1.1 mg/dL — ABNORMAL HIGH (ref 0.44–1.00)
GFR, Estimated: 49 mL/min — ABNORMAL LOW (ref >60)
Glucose, Bld: 131 mg/dL — ABNORMAL HIGH (ref 70–99)
Potassium: 4.4 mmol/L (ref 3.5–5.1)
Sodium: 136 mmol/L (ref 135–145)
Total Bilirubin: 0.5 mg/dL (ref 0.3–1.2)
Total Protein: 6.7 g/dL (ref 6.5–8.1)

## 2022-02-04 NOTE — Progress Notes (Signed)
Hx CLL. Breast Ca. Going to [facility] urological for ongoing symptoms for her bladder dropping. Pessari is in and helping. Voiding well now with some incontinence. Just finished antibiotic. Appetite is good. Energy is good. GYN bladder specialist next week with a scan is ordered for patient.

## 2022-02-06 ENCOUNTER — Other Ambulatory Visit (HOSPITAL_COMMUNITY): Payer: Self-pay

## 2022-02-12 NOTE — Progress Notes (Signed)
[facility] New Patient Evaluation and Consultation  Referring Provider: Gae Dry, MD PCP: Lynnell Jude, MD Date of Service: 02/13/2022  SUBJECTIVE Chief Complaint: New Patient (Initial Visit)  History of Present Illness: Megan Santana is a 88 y.o. White or Caucasian female seen in consultation at the request of Dr. Kenton Kingfisher for evaluation of prolapse.    Daughter is here with her today. States she has noticed more cognitive issues, especially with her memory lately.   Review of records from Epic significant for: Has tried [place] oxybutynin and myrbetriq for OAB symptoms. On trimethoprim daily for antibiotic UTI prophylaxis.   Has a #6 iRWS pessary for her cystocele.   Urinary Symptoms: Leaks urine with going from sitting to standing and with movement to the bathroom Leaks every time she stands and sometimes while walking around.  Pad use: 5 adult diapers per day.   She is bothered by her UI symptoms. Currently on Gemtesa, seems to be helping some.   Day time voids 6.  Nocturia: 1-2 times (but does not wake up to urinate). Voiding dysfunction: she does not empty her bladder well.  does not use a catheter to empty bladder.  When urinating, she feels dribbling after finishing, the need to urinate multiple times in a row, and to push on her belly or vagina to empty bladder   UTIs: 3-4  UTI's in the last year.   Denies history of blood in urine and kidney or bladder stones  Pelvic Organ Prolapse Symptoms:                  She Admits to a feeling of a bulge the vaginal area.  She Admits to seeing a bulge.  This bulge is bothersome. Pessary has been in place for about 3 months. It has helped her to empty her bladder. Prior to the pessary being in place, she went to the emergency room for inability to void.   Bowel Symptom: Bowel movements: daily  Stool consistency: soft  Straining: no.  Splinting: no.  Incomplete evacuation: no.  She Denies accidental  bowel leakage / fecal incontinence   Sexual Function Sexually active: no.   Pelvic Pain Denies pelvic pain   Past Medical History:  Past Medical History:  Diagnosis Date   Arthritis    Breast cancer ([place]) 2007   right lumpectomy   Breast cancer ([place]) 03/31/2017   16 mm invasive mammary carcinoma, left upper outer quadrant, T1c, N0, triple negative, histologic grade 3.   Family history of adverse reaction to anesthesia    mom n/v   GERD (gastroesophageal reflux disease)    Heart murmur    Lumbar radiculitis    Lumbar stenosis    Personal history of chemotherapy 2018   Left   Personal history of radiation therapy 2007   Right   Personal history of radiation therapy 2018   Left     Past Surgical History:   Past Surgical History:  Procedure Laterality Date   ABDOMINAL HYSTERECTOMY     total   BACK SURGERY     lumbar   BREAST BIOPSY Bilateral 03/31/2017   bilat u/s core INVASIVE MAMMARY CARCINOMA   BREAST BIOPSY Right 03/31/2017   DENSE FIBROSIS AND SHEETS OF MACROPHAGES   BREAST EXCISIONAL BIOPSY Left 2018   BREAST LUMPECTOMY Right 2007   BREAST LUMPECTOMY Left 04/23/2017   BREAST LUMPECTOMY Left 04/23/2017   Procedure: BREAST LUMPECTOMY WITH EXCISION OF SENTINEL NODE;  Surgeon: Robert Bellow,  MD;  Whole breast radiation ORS;  Service: General;  Laterality: Left;   BREAST SURGERY Right    Wide excision   COLONOSCOPY     EYE SURGERY     cataracts bil   FRACTURE SURGERY Left 2015 or 2016   INCISION AND DRAINAGE ABSCESS Left 03/27/2021   Procedure: debridement left chest wall and seroma drainage;  Surgeon: Robert Bellow, MD;  Location: [facility] ORS;  Service: General;  Laterality: Left;   MASTECTOMY Left 02/24/2021   SIMPLE MASTECTOMY WITH AXILLARY SENTINEL NODE BIOPSY Left 02/25/2021   Procedure: SIMPLE MASTECTOMY WITH AXILLARY SENTINEL NODE BIOPSY;  Surgeon: Robert Bellow, MD;  Location: [facility] ORS;  Service: General;  Laterality: Left;   TUMOR EXCISION   2001     Past OB/GYN History: OB History  Gravida Para Term Preterm AB Living  3 3       2   SAB IAB Ectopic Multiple Live Births          3    # Outcome Date GA Lbr Len/2nd Weight Sex Delivery Anes PTL Lv  3 Para           2 Para           1 Para             Obstetric Comments  1st Menstrual Cycle:  18   1st Pregnancy:  21    Vaginal deliveries: 3 S/p hysterectomy   Medications: She has a current medication list which includes the following prescription(s): acetaminophen, albuterol, calcium carbonate, imbruvica, magnesium oxide, multiple minerals-vitamins, centrum silver 50+women, potassium, tamoxifen, tramadol, trimethoprim, and gemtesa.   Allergies: Patient is allergic to oxycodone.   Social History:  Social History   Tobacco Use   Smoking status: Former    Packs/day: 0.25    Years: 10.00    Pack years: 2.50    Types: Cigarettes    Quit date: 1970    Years since quitting: 53.2   Smokeless tobacco: Never  Vaping Use   Vaping Use: Never used  Substance Use Topics   Alcohol use: Not Currently   Drug use: No    Relationship status: widowed She lives alone She is not employed. Regular exercise: Yes: very active in senior games or yard work History of abuse: No  Family History:   Family History  Problem Relation Age of Onset   Breast cancer Sister 56   Lung cancer Mother 71       mets to breast   Lung cancer Father 78   Heart attack Brother    Breast cancer Maternal Aunt        age unknown   Stroke Sister 75     Review of Systems: ROS   OBJECTIVE Physical Exam: Vitals:   02/13/22 0900  BP: 122/74  Pulse: 72  Weight: 165 lb (74.8 kg)  Height: 5\' 2"  (1.575 m)    Physical Exam Constitutional:      General: She is not in acute distress. Pulmonary:     Effort: Pulmonary effort is normal.  Abdominal:     General: There is no distension.     Palpations: Abdomen is soft.     Tenderness: There is no abdominal tenderness. There is no  rebound.  Musculoskeletal:        General: No swelling. Normal range of motion.  Skin:    General: Skin is warm and dry.     Findings: No rash.  Neurological:     Mental Status:  She is alert and oriented to person, place, and time.  Psychiatric:        Mood and Affect: Mood normal.        Behavior: Behavior normal.     GU / Detailed Urogynecologic Evaluation:  Pelvic Exam: Normal external female genitalia; Bartholin's and Skene's glands normal in appearance; urethral meatus normal in appearance, no urethral masses or discharge.   CST: negative   Pessary removed- incontinence ring s/p hysterectomy: Speculum exam reveals normal vaginal mucosa with  atrophy and normal vaginal cuff.  Adnexa no mass, fullness, tenderness.   Pessary cleaned and replaced.   Pelvic floor strength I/V  Pelvic floor musculature: Right levator non-tender, Right obturator non-tender, Left levator non-tender, Left obturator non-tender  POP-Q:   POP-Q  0                                            Aa   0                                           Ba  -4                                              C   5                                            Gh  5                                            Pb  7.5                                            tvl   -2                                            Ap  -2                                            Bp                                                 D     Rectal Exam:  Normal external rectum  Post-Void Residual (PVR) by Bladder Scan: In order to evaluate bladder emptying, we discussed obtaining a postvoid residual and she agreed to this procedure.  Procedure: The ultrasound unit was placed on the patient's abdomen in the  suprapubic region after the patient had voided. A PVR of 63 ml was obtained by bladder scan.  Laboratory Results: POC urine: negative   ASSESSMENT AND PLAN Ms. Brinkley is a 88 y.o. with:  1. Prolapse of anterior vaginal  wall   2. Vaginal vault prolapse after hysterectomy   3. SUI (stress urinary incontinence, female)   4. Overactive bladder    Stage II anterior, Stage I posterior, Stage I apical prolapse - For treatment of pelvic organ prolapse, we discussed options for management including expectant management, conservative management, and surgical management, such as Kegels, a pessary, pelvic floor physical therapy, and specific surgical procedures. - She and her daughter prefer not to have surgery at this time. Therefore will continue with the pessary and will plan for cleanings once every 3 months.  - Use coconut oil daily for vaginal moisture to help make pessary cleanings more comfortable. She will ask her oncologist about vaginal estrogen use.   2. OAB and SUI - undergoing urodynamic testing at [facility] and has follow up with Dr Matilde Sprang for treatment options - currently on Gemtesa.    Megan Folds, MD

## 2022-02-13 ENCOUNTER — Encounter: Payer: Self-pay | Admitting: Obstetrics and Gynecology

## 2022-02-13 ENCOUNTER — Other Ambulatory Visit: Payer: Self-pay

## 2022-02-13 ENCOUNTER — Ambulatory Visit: Payer: Medicare HMO | Admitting: Obstetrics and Gynecology

## 2022-02-13 VITALS — BP 122/74 | HR 72 | Ht 62.0 in | Wt 165.0 lb

## 2022-02-13 DIAGNOSIS — N8110 Cystocele, unspecified: Secondary | ICD-10-CM | POA: Diagnosis not present

## 2022-02-13 DIAGNOSIS — N3281 Overactive bladder: Secondary | ICD-10-CM | POA: Diagnosis not present

## 2022-02-13 DIAGNOSIS — N393 Stress incontinence (female) (male): Secondary | ICD-10-CM | POA: Diagnosis not present

## 2022-02-13 DIAGNOSIS — R350 Frequency of micturition: Secondary | ICD-10-CM | POA: Diagnosis not present

## 2022-02-13 DIAGNOSIS — N993 Prolapse of vaginal vault after hysterectomy: Secondary | ICD-10-CM

## 2022-02-13 LAB — POCT URINALYSIS DIPSTICK
Appearance: NORMAL
Bilirubin, UA: NEGATIVE
Blood, UA: NEGATIVE
Glucose, UA: NEGATIVE
Ketones, UA: NEGATIVE
Leukocytes, UA: NEGATIVE
Nitrite, UA: NEGATIVE
Protein, UA: NEGATIVE
Spec Grav, UA: 1.015 (ref 1.010–1.025)
Urobilinogen, UA: 0.2 E.U./dL
pH, UA: 6.5 (ref 5.0–8.0)

## 2022-02-13 NOTE — Addendum Note (Signed)
Addended by: Elita Quick on: 02/13/2022 04:53 PM ? ? Modules accepted: Orders ? ?

## 2022-02-13 NOTE — Patient Instructions (Signed)
Use coconut oil daily at the vaginal opening to help with vaginal moisture.  ?

## 2022-02-18 ENCOUNTER — Other Ambulatory Visit: Payer: Self-pay | Admitting: Urology

## 2022-02-28 ENCOUNTER — Other Ambulatory Visit (HOSPITAL_COMMUNITY): Payer: Self-pay

## 2022-03-03 ENCOUNTER — Other Ambulatory Visit (HOSPITAL_COMMUNITY): Payer: Self-pay

## 2022-03-05 ENCOUNTER — Ambulatory Visit: Payer: Medicare HMO | Admitting: Obstetrics and Gynecology

## 2022-03-05 ENCOUNTER — Other Ambulatory Visit (HOSPITAL_COMMUNITY): Payer: Self-pay

## 2022-03-10 ENCOUNTER — Ambulatory Visit: Payer: Medicare HMO | Admitting: Urology

## 2022-03-13 ENCOUNTER — Other Ambulatory Visit: Payer: Self-pay | Admitting: Oncology

## 2022-03-28 ENCOUNTER — Other Ambulatory Visit (HOSPITAL_COMMUNITY): Payer: Self-pay

## 2022-03-31 ENCOUNTER — Encounter: Payer: Self-pay | Admitting: Urology

## 2022-03-31 ENCOUNTER — Ambulatory Visit: Payer: Medicare HMO | Admitting: Urology

## 2022-03-31 VITALS — BP 130/77 | HR 70 | Ht 62.0 in | Wt 165.0 lb

## 2022-03-31 DIAGNOSIS — N3946 Mixed incontinence: Secondary | ICD-10-CM | POA: Diagnosis not present

## 2022-03-31 DIAGNOSIS — N390 Urinary tract infection, site not specified: Secondary | ICD-10-CM

## 2022-03-31 MED ORDER — NITROFURANTOIN MONOHYD MACRO 100 MG PO CAPS: 100.0000 mg | ORAL_CAPSULE | Freq: Every day | ORAL | 11 refills | Status: DC

## 2022-03-31 NOTE — Progress Notes (Signed)
? ?03/31/2022 ?3:20 PM  ? ?Megan Santana ?September 28, 1934 ?[id number] ? ?Referring provider: Lynnell Jude, MD ?[address] ?[place],  [place] [id number] ? ?No chief complaint on file. ? ? ?HPI: ?I was consulted to assess the patient for prolapse and incomplete bladder emptying.  At the end of September she went to the emergency room and was catheterized for 400 mL.  She has been told she has a cystocele and has had a hysterectomy.  She was catheterized for 400 mL.  She was given Keflex.  She has been treated 2 or 3 times in the last several weeks or a few months with antibiotics.  She is currently on Omnicef and had a positive culture ? ?I do believe she feels vaginal bulging and it can spontaneously reduce some.  Her flow is sometimes good sometimes poor.  Sometimes she feels empty and other times she does not. ?  ?She has had an in and out catheterization 2 or 3 times.  She did not tolerate an indwelling Foley catheter since it was very painful but she was infected at the time ? ?She describes foot on the floor syndrome in the middle of the night.  She can go from a sitting to standing position and leak.  She does not cough and sneeze and denies stress incontinence and bedwetting. she can feel urgency and then cannot urinate.  She was a bit nonspecific but I think generally wears 1 or 2 pads a day but in the last 2 months it is more often but variable. ? ?She has had back surgery.  She is alternating constipation and diarrhea.  She has had radiation and chemotherapy and is chronic lymphocytic lymphoma.  No new nodules on recent CT scan.  CT scan August 01, 2021 demonstrated no hydronephrosis ?  ?In March she had this year she was diagnosed and had surgery for breast cancer left side.  Then she had an infection.  She said to surgery and does not want more surgery ?  ?She states he felt uncomfortable especially when she stands near the umbilicus. ?  ?On pelvic examination patient had a grade 3 cystocele with central  defect that reached the introitus and went beyond 1 or 2 cm.  She had a long vaginal vault that was capacious.  I did not see the vaginal cuff.  The vaginal cuff descended from 8 or 9 cm to approximately 6 cm.  She had a diffuse mild grade 2 rectocele but minimal central defect.  She had some deficiency of the posterior fourchette.  When I felt for the ischial spine her length was longer than it.  She did not have stress incontinence but likely leaked a few drops from overactivity. ?  ?Patient has symptomatic prolapse.  She has elevated residuals.  She has urge incontinence and may have stress incontinence.  Her flow symptoms vary.  The role of urinary prophylaxis discussed.  The role of teaching clean intermittent catheterization discussed.  The role of urodynamics discussed.  Her inability to tolerate the catheter may have been from the infection.  Picture was drawn.  If she had surgery a robotic repair probably would be the best due to vaginal length.  Role of pessary due to age and medical comorbidities discussed. ?  ?Patient would like to be helped without surgery.  Hopefully we can keep her urine sterile she will be much more comfortable.  I am not convinced that her umbilical discomfort improved when we catheterized her.  She  does have an umbilical hernia apparently.  I called in trimethoprim 100 mg 3x11.  She does not want surgery at this stage and we will not be ordering urodynamics unless her treatment pathway changes.  She will see gynecology hopefully can be fitted for a pessary.  She understands that if she is comfortable on trimethoprim with high residuals that she does not need surgery and could be followed conservatively ?  ?Today ?On review of the medical record patient has been fitted with a pessary and was doing quite well.  She saw gynecology in October 2022 and her medical oncologist in November 2022.  She did have a positive culture i the last time I saw her. ?  ?She is feeling good and very  comfortable with the pessary.  Clinically not infected and on trimethoprim renewed.  Now she is leaking more.  She is not complaining a lot of urge incontinence but was having some foot on the floor syndrome improving.  She leaks with no warning.  She leaks when she coughs or sneezes.  No bedwetting.  She is using a double pad system and she can change it multiple times a day   ?  ?Patient was having overactive bladder and urge incontinence symptoms prior to pessary.  She now may have mixed incontinence but is worse. Stay on trimethoprim.  Reassess in 7 weeks on Myrbetriq 50 mg samples and prescription.  Hopefully we can greatly improve her continence without needing to offer anything invasive.  Overall she is happy that she does not need a catheter and is a lot more comfortable ?  ?Today ?During last visit culture was positive.  Daughter had called in the Myrbetriq was not helping.  Patient continues to have mixed incontinence.  She leaks a lot when she goes from a sitting to standing position and does not necessarily feel any urgency.  She can still soaked 6 pads a day.  On oxybutynin and trimethoprim and clinically not infected. ? ?Urine sent for culture.  I have recommended urodynamics and return for cystoscopy and proceed accordingly.  Refractory overactive bladder treatments and a bulking agent may be options for her.  The role of Gemtesa discussed.  Stop oxybutynin.  Call if culture positive.  They might see Dr. Wannetta Sender in the future since her gynecologist locally who fit the pessary is moving.  They will cancel the urodynamics if the [place] works well.  She has failed Myrbetriq ? ?Today ?Frequency stable.  Last culture positive.  This was a breakthrough infection on the trimethoprim.  Frequency stable. ? ?On urodynamics patient did not void and was catheterized for 50 mL.  Maximum bladder capacity was 630 mL.  Bladder was stable.  She had no stress incontinence with a Valsalva pressure of 88 cm of water.   Leak point pressures were checked with and without the prolapse reduced.  During voiding she did generate a detrusor contraction.  She voided 114 mL with a maximal flow 12 mils per second.  Max voiding pressure 14 cm of water.  She had a very prolonged intermittent pattern associated with straining.  Residual was 523 mL.  Bladder neck distended 3 cm.  EMG activity decreased during voiding.  The details of the urodynamics are signed dictated ? ?Patient has had at least 2 breakthrough infections on trimethoprim but not clinically infected today.  She really thinks [place] starting to work.  She is happy with Dr. Tommas Olp care and the pessary. ?  ?    ? ? ?  PMH: ?Past Medical History:  ?Diagnosis Date  ? Arthritis   ? Breast cancer ([place]) 2007  ? right lumpectomy  ? Breast cancer ([place]) 03/31/2017  ? 16 mm invasive mammary carcinoma, left upper outer quadrant, T1c, N0, triple negative, histologic grade 3.  ? Family history of adverse reaction to anesthesia   ? mom n/v  ? GERD (gastroesophageal reflux disease)   ? Heart murmur   ? Lumbar radiculitis   ? Lumbar stenosis   ? Personal history of chemotherapy 2018  ? Left  ? Personal history of radiation therapy 2007  ? Right  ? Personal history of radiation therapy 2018  ? Left  ? ? ?Surgical History: ?Past Surgical History:  ?Procedure Laterality Date  ? ABDOMINAL HYSTERECTOMY    ? total  ? BACK SURGERY    ? lumbar  ? BREAST BIOPSY Bilateral 03/31/2017  ? bilat u/s core INVASIVE MAMMARY CARCINOMA  ? BREAST BIOPSY Right 03/31/2017  ? DENSE FIBROSIS AND SHEETS OF MACROPHAGES  ? BREAST EXCISIONAL BIOPSY Left 2018  ? BREAST LUMPECTOMY Right 2007  ? BREAST LUMPECTOMY Left 04/23/2017  ? BREAST LUMPECTOMY Left 04/23/2017  ? Procedure: BREAST LUMPECTOMY WITH EXCISION OF SENTINEL NODE;  Surgeon: Robert Bellow, MD;  Whole breast radiation ORS;  Service: General;  Laterality: Left;  ? BREAST SURGERY Right   ? Wide excision  ? COLONOSCOPY    ? EYE SURGERY    ? cataracts bil  ?  FRACTURE SURGERY Left 2015 or 2016  ? INCISION AND DRAINAGE ABSCESS Left 03/27/2021  ? Procedure: debridement left chest wall and seroma drainage;  Surgeon: Robert Bellow, MD;  Location: [facility] ORS;  Ser

## 2022-04-01 ENCOUNTER — Other Ambulatory Visit: Payer: Medicare HMO

## 2022-04-01 DIAGNOSIS — N390 Urinary tract infection, site not specified: Secondary | ICD-10-CM

## 2022-04-02 ENCOUNTER — Other Ambulatory Visit (HOSPITAL_COMMUNITY): Payer: Self-pay

## 2022-04-05 LAB — CULTURE, URINE COMPREHENSIVE

## 2022-04-07 ENCOUNTER — Encounter: Payer: Self-pay | Admitting: Urology

## 2022-04-07 ENCOUNTER — Telehealth: Payer: Self-pay | Admitting: *Deleted

## 2022-04-07 ENCOUNTER — Other Ambulatory Visit: Payer: Self-pay | Admitting: *Deleted

## 2022-04-07 MED ORDER — CIPROFLOXACIN HCL 250 MG PO TABS
250.0000 mg | ORAL_TABLET | Freq: Two times a day (BID) | ORAL | Status: DC
Start: 2022-04-07 — End: 2022-04-08

## 2022-04-07 NOTE — Telephone Encounter (Addendum)
Sent in RX as requested. Voice understanding ? ? ?----- Message from Bjorn Loser, MD sent at 04/07/2022  8:14 AM EDT ----- ?Ciprofloxacin 250 mg twice a day for 7 days ?Then go back on daily Macrodantin ?----- Message ----- ?From: Alvera Novel, CMA ?Sent: 04/03/2022   8:20 AM EDT ?To: Bjorn Loser, MD ? ? ?----- Message ----- ?From: Interface, [facility] Lab Results In ?Sent: 04/03/2022   7:40 AM EDT ?To: [name] ? ? ? ?

## 2022-04-08 MED ORDER — CIPROFLOXACIN HCL 250 MG PO TABS: 250.0000 mg | ORAL_TABLET | Freq: Two times a day (BID) | ORAL | Status: DC

## 2022-04-08 MED ORDER — CIPROFLOXACIN HCL 250 MG PO TABS: 250.0000 mg | ORAL_TABLET | Freq: Two times a day (BID) | ORAL | 0 refills | Status: DC

## 2022-04-28 ENCOUNTER — Other Ambulatory Visit (HOSPITAL_COMMUNITY): Payer: Self-pay

## 2022-04-30 ENCOUNTER — Other Ambulatory Visit (HOSPITAL_COMMUNITY): Payer: Self-pay

## 2022-05-04 NOTE — Progress Notes (Unsigned)
[name]  [phone]) [phone] [phone]  ID: Megan Santana OB: 01-03-1934  MR#: [id number]  ZRA#:[id number]  Patient Care Team: Lynnell Jude, MD as PCP - General (Family Medicine) Rico Junker, RN as Oncology Nurse Navigator Lloyd Huger, MD as Consulting Physician (Oncology) Clemmie Krill Lynnell Jude, MD as Referring Physician (Family Medicine) Bary Castilla, Forest Gleason, MD (General Surgery) Noreene Filbert, MD as Referring Physician (Radiation Oncology)   CHIEF COMPLAINT:  CLL, q13 deletion.  DCIS left breast  INTERVAL HISTORY: Patient returns to clinic today for repeat laboratory can routine 44-monthevaluation.  She is tolerating Imbruvica and tamoxifen well without significant side effects.  She currently feels well and is asymptomatic.  She has no neurologic complaints. She denies any recent fevers or illnesses.  She denies any night sweats or unintentional weight loss.  She has no chest pain, shortness of breath, cough, or hemoptysis.  She denies any nausea, vomiting, constipation, or diarrhea. She has no urinary complaints.  Patient offers no specific complaints today.  REVIEW OF SYSTEMS:   Review of Systems  Constitutional: Negative.  Negative for fever, malaise/fatigue and weight loss.  Respiratory: Negative.  Negative for cough, hemoptysis and shortness of breath.   Cardiovascular: Negative.  Negative for chest pain and leg swelling.  Gastrointestinal: Negative.  Negative for abdominal pain and constipation.  Genitourinary: Negative.  Negative for dysuria and flank pain.  Musculoskeletal: Negative.  Negative for back pain and joint pain.  Skin: Negative.  Negative for rash.  Neurological: Negative.  Negative for sensory change, focal weakness, weakness and headaches.  Psychiatric/Behavioral: Negative.  The patient is not nervous/anxious and does not have insomnia.    As per HPI. Otherwise, a complete review of systems is negative.  PAST  MEDICAL HISTORY: Past Medical History:  Diagnosis Date   Arthritis    [place]   [place]   [place]   16 mm invasive mammary carcinoma, left upper outer quadrant, T1c, N0, triple negative, histologic grade 3.   Family history of adverse reaction to anesthesia    mom n/v   GERD (gastroesophageal reflux disease)    Heart murmur    Lumbar radiculitis    Lumbar stenosis    Personal history of chemotherapy 2018   Left   Personal history of radiation therapy 2007   Right   Personal history of radiation therapy 2018   Left    PAST SURGICAL HISTORY: Past Surgical History:  Procedure Laterality Date   ABDOMINAL HYSTERECTOMY     total   BACK SURGERY     lumbar   BREAST BIOPSY Bilateral 03/31/2017   bilat u/s core INVASIVE MAMMARY CARCINOMA   BREAST BIOPSY Right 03/31/2017   DENSE FIBROSIS AND SHEETS OF MACROPHAGES   BREAST EXCISIONAL BIOPSY Left 2018   BREAST LUMPECTOMY Right 2007   BREAST LUMPECTOMY Left 04/23/2017   BREAST LUMPECTOMY Left 04/23/2017   Procedure: BREAST LUMPECTOMY WITH EXCISION OF SENTINEL NODE;  Surgeon: BRobert Bellow MD;  Whole breast radiation ORS;  Service: General;  Laterality: Left;   BREAST SURGERY Right    Wide excision   COLONOSCOPY     EYE SURGERY     cataracts bil   FRACTURE SURGERY Left 2015 or 2016   INCISION AND DRAINAGE ABSCESS Left 03/27/2021   Procedure: debridement left chest wall and seroma drainage;  Surgeon: BRobert Bellow MD;  Location: [facility] ORS;  Service: General;  Laterality: Left;   MASTECTOMY Left 02/24/2021  SIMPLE MASTECTOMY WITH AXILLARY SENTINEL NODE BIOPSY Left 02/25/2021   Procedure: SIMPLE MASTECTOMY WITH AXILLARY SENTINEL NODE BIOPSY;  Surgeon: Robert Bellow, MD;  Location: [facility] ORS;  Service: General;  Laterality: Left;   TUMOR EXCISION  2001    FAMILY HISTORY: Family History  Problem Relation Age of Onset   Breast cancer Sister 78   Lung cancer Mother 68        mets to breast   Lung cancer Father 51   Heart attack Brother    Breast cancer Maternal Aunt        age unknown   Stroke Sister 82    ADVANCED DIRECTIVES (Y/N):  N  HEALTH MAINTENANCE: Social History   Tobacco Use   Smoking status: Former    Packs/day: 0.25    Years: 10.00    Pack years: 2.50    Types: Cigarettes    Quit date: 1970    Years since quitting: 53.4   Smokeless tobacco: Never  Vaping Use   Vaping Use: Never used  Substance Use Topics   Alcohol use: Not Currently   Drug use: No     Colonoscopy:  PAP:  Bone density:  Lipid panel:  Allergies  Allergen Reactions   Oxycodone Other (See Comments)    Can't sleep, feels bad when taking.    Current Outpatient Medications  Medication Sig Dispense Refill   acetaminophen (TYLENOL) 500 MG tablet Take 1,000 mg by mouth every 6 (six) hours as needed (for pain.).     albuterol (VENTOLIN HFA) 108 (90 Base) MCG/ACT inhaler Inhale 2 puffs into the lungs every 4 (four) hours as needed.     calcium carbonate (TUMS EX) 750 MG chewable tablet Chew 1-2 tablets by mouth 3 (three) times daily as needed (for heartburn/indigestion).     ciprofloxacin (CIPRO) 250 MG tablet Take 1 tablet (250 mg total) by mouth 2 (two) times daily. 14 tablet 0   ibrutinib (IMBRUVICA) 420 MG tablet TAKE 1 TABLET (420 MG) BY MOUTH DAILY. 28 tablet 3   Magnesium Oxide 250 MG TABS Take 250 mg by mouth daily.      Multiple Minerals-Vitamins (CAL MAG ZINC +D3 PO) Take 1 tablet by mouth daily.     Multiple Vitamins-Minerals (CENTRUM SILVER 50+WOMEN) TABS Take 1 tablet by mouth daily.     nitrofurantoin, macrocrystal-monohydrate, (MACROBID) 100 MG capsule Take 1 capsule (100 mg total) by mouth daily. 30 capsule 11   Potassium 99 MG TABS Take 99 mg by mouth daily.      tamoxifen (NOLVADEX) 20 MG tablet TAKE 1 TABLET BY MOUTH EVERY DAY 90 tablet 1   traMADol (ULTRAM) 50 MG tablet Take by mouth every 6 (six) hours as needed.     Vibegron (GEMTESA) 75  MG TABS Take 75 mg by mouth daily. 30 tablet 11   No current facility-administered medications for this visit.    OBJECTIVE: There were no vitals filed for this visit.     There is no height or weight on file to calculate BMI.    ECOG FS:0 - Asymptomatic  General: Well-developed, well-nourished, no acute distress. Eyes: Pink conjunctiva, anicteric sclera. HEENT: Normocephalic, moist mucous membranes. Breast: Exam deferred today. Lungs: No audible wheezing or coughing. Heart: Regular rate and rhythm. Abdomen: Soft, nontender, no obvious distention. Musculoskeletal: No edema, cyanosis, or clubbing. Neuro: Alert, answering all questions appropriately. Cranial nerves grossly intact. Skin: No rashes or petechiae noted. Psych: Normal affect.  LAB RESULTS:  Lab Results  Component Value Date  NA 136 02/04/2022   K 4.4 02/04/2022   CL 106 02/04/2022   CO2 21 (L) 02/04/2022   GLUCOSE 131 (H) 02/04/2022   BUN 17 02/04/2022   CREATININE 1.10 (H) 02/04/2022   CALCIUM 8.8 (L) 02/04/2022   PROT 6.7 02/04/2022   ALBUMIN 4.0 02/04/2022   AST 22 02/04/2022   ALT 21 02/04/2022   ALKPHOS 39 02/04/2022   BILITOT 0.5 02/04/2022   GFRNONAA 49 (L) 02/04/2022   GFRAA >60 09/10/2020    Lab Results  Component Value Date   WBC 9.5 02/04/2022   NEUTROABS 5.0 02/04/2022   HGB 12.1 02/04/2022   HCT 36.0 02/04/2022   MCV 90.0 02/04/2022   PLT 155 02/04/2022     STUDIES: No results found.  ASSESSMENT:  CLL, q13 deletion.  DCIS left breast  PLAN:    1. CLL, q13 deletion: CT scan results from February 04, 2021 reviewed independently with essential resolution of extensive lymphadenopathy throughout the chest, abdomen, pelvis. Patient initiated 420 mg Imbruvica in October 2021 after a reaction to Rituxan.  Treatment was discontinued temporarily surrounding her breast surgery.  Patient is now back on Imbruvica and her white count is now within normal limits.  Repeat imaging on July 29, 2021 reviewed independently and reported as above significant improvement of patient's lymphadenopathy in her chest, abdomen, and pelvis.  She has no splenomegaly.  No further imaging is needed unless there is suspicion of progression of disease.  Continue Imbruvica as prescribed.  Return to clinic in 3 months with repeat laboratory and further evaluation.   2.  Left breast DCIS: Patient underwent total mastectomy on February 25, 2021 confirming diagnosis.  She did not require adjuvant XRT.  Patient's most recent right unilateral screening mammogram was on September 24, 2021 and reported as BI-RADS 2.  Continue tamoxifen for total 5 years completing treatment in May 2027.  3.  History of pathologic stage IB triple negative invasive carcinoma of the upper outer quadrant of the left breast: Patient's previous breast cancer was in her right breast and was ER/PR positive. This was likely a second primary.  Patient completed 4 cycles of Taxotere and Cytoxan August 06, 2017.  Also completed adjuvant XRT.  An aromatase inhibitor would not be of any benefit given the ER/PR status of her tumor.   3. 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 Oncotype score 17: Originally diagnosed in 2008. Patient completed 5 years of Arimidex in approximately August 2013. Biopsy of the right breast only revealed fat necrosis at the site of her previous MammoSite.  Mammogram and possible biopsy as above. 4. Genetic testing: Patient has a sister and mother both with breast cancer. 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 5.  Anxiety: Chronic and unchanged.  Continue Xanax as needed. 6.  Anemia: Resolved.   7.  Thrombocytopenia: Resolved. 8.  Urogynecology: Significantly improved.  Continue treatment and follow-up per gynecology and urology.  Patient expressed understanding and was in agreement with this plan. She also  understands that She can call clinic at any time with any questions, concerns, or complaints.    Cancer Staging  CLL (chronic lymphocytic leukemia) ([place]) Staging form: Chronic Lymphocytic Leukemia / Small Lymphocytic Lymphoma, AJCC 8th Edition - Clinical stage from 03/14/2021: Modified Rai Stage I (Modified Rai risk: Intermediate, Lymphocytosis: Present, Adenopathy: Present, Organomegaly: Absent, Anemia: Absent, Thrombocytopenia: Absent) - Signed by Lloyd Huger,  MD on 03/14/2021 Stage prefix: Initial diagnosis  Carcinoma of upper-outer quadrant of left breast in female, estrogen receptor negative ([place]) Staging form: Breast, AJCC 8th Edition - Clinical stage from 04/08/2017: Stage IB (cT1c, cN0, cM0, G3, ER-, PR-, HER2-) - Signed by Lloyd Huger, MD on 04/08/2017 Histologic grading system: 3 grade system Laterality: Right - Pathologic stage from 05/11/2017: Stage IB (pT1c, pN0, cM0, G3, ER-, PR-, HER2-) - Signed by Lloyd Huger, MD on 05/11/2017 Neoadjuvant therapy: No Histologic grading system: 3 grade system Laterality: Left  Ductal carcinoma in situ (DCIS) of left breast Staging form: Breast, AJCC 8th Edition - Clinical stage from 03/14/2021: Stage 0 (cTis (DCIS), cN0, cM0, ER+, PR+, HER2-) - Signed by Lloyd Huger, MD on 03/14/2021 Nuclear grade: Patrecia Pour, MD   05/04/2022 7:01 AM

## 2022-05-06 ENCOUNTER — Other Ambulatory Visit (HOSPITAL_COMMUNITY): Payer: Self-pay

## 2022-05-06 ENCOUNTER — Other Ambulatory Visit: Payer: Self-pay | Admitting: Oncology

## 2022-05-06 DIAGNOSIS — C9110 Chronic lymphocytic leukemia of B-cell type not having achieved remission: Secondary | ICD-10-CM

## 2022-05-06 MED ORDER — IBRUTINIB 420 MG PO TABS
ORAL_TABLET | ORAL | 3 refills | Status: AC
  Filled 2022-05-06: qty 28, 28d supply, fill #0

## 2022-05-06 NOTE — Telephone Encounter (Signed)
CBC with Differential Order: [id number] Status: Final result    Visible to patient: Yes (not seen)    Next appt: 05/13/2022 at 02:15 PM in Oncology ([facility])    Dx: CLL (chronic lymphocytic leukemia) ([place])    0 Result Notes           Component Ref Range & Units 3 mo ago (02/04/22) 5 mo ago (11/25/21) 6 mo ago (10/31/21) 7 mo ago (09/16/21) 7 mo ago (09/10/21) 8 mo ago (08/16/21) 9 mo ago (07/30/21)  WBC 4.0 - 10.5 K/uL 9.5   9.0   6.3  11.0 High   10.6 High    RBC 3.87 - 5.11 MIL/uL 4.00  0-2 R  3.99  None seen R  3.97  3.82 Low   3.84 Low    Hemoglobin 12.0 - 15.0 g/dL 12.1   11.8 Low    11.8 Low   11.8 Low   11.5 Low    HCT 36.0 - 46.0 % 36.0   36.0   36.0  34.5 Low   34.5 Low    MCV 80.0 - 100.0 fL 90.0   90.2   90.7  90.3  89.8   MCH 26.0 - 34.0 pg 30.3   29.6   29.7  30.9  29.9   MCHC 30.0 - 36.0 g/dL 33.6   32.8   32.8  34.2  33.3   RDW 11.5 - 15.5 % 12.8   13.2   13.0  12.8  13.2   Platelets 150 - 400 K/uL 155   152   128 Low  CM  174  134 Low    nRBC 0.0 - 0.2 % 0.0   0.0   0.0 CM  0.0  0.0   Neutrophils Relative % % 53   52    54  31   Neutro Abs 1.7 - 7.7 K/uL 5.0   4.7    6.0  3.2   Lymphocytes Relative % 40   37    36  56   Lymphs Abs 0.7 - 4.0 K/uL 3.8   3.3    3.9  6.0 High    Monocytes Relative % '3   7    6  6   '$ Monocytes Absolute 0.1 - 1.0 K/uL 0.3   0.6    0.7  0.7   Eosinophils Relative % '2   2    2  3   '$ Eosinophils Absolute 0.0 - 0.5 K/uL 0.2   0.2    0.2  0.3   Basophils Relative % '2   1    1  1   '$ Basophils Absolute 0.0 - 0.1 K/uL 0.2 High    0.1    0.1  0.1   WBC Morphology  DIFF CONFIRMED       DIFF CONFIRMED BY MANUAL.   RBC Morphology  UNREMARKABLE       UNREMARKABLE   Smear Review  Normal platelet morphology       PLATELETS APPEAR DECREASED CM   Comment: PLATELETS APPEAR ADEQUATE  Abs Immature Granulocytes 0.00 - 0.07 K/uL 0.00   0.09 High  CM    0.11 High  CM  0.31 High  CM   Comment: Performed at [facility], [address] [place],  [place] [address]  WBC, UA   0-5 R   0-5 R      Epithelial Cells (non renal)   0-10 R   >10 High  R  Bacteria, UA   Few Abnormal  R   None seen R      Immature Granulocytes    1 R    1 R  3 R   Resulting Agency  [facility] [place] [facility] [place] [facility] [place] [facility] [place] [facility]         Specimen Collected: 02/04/22 13:55 Last Resulted: 02/04/22 14:48      Lab Flowsheet    Order Details    View Encounter    Lab and Collection Details    Routing    Result History    View Encounter Conversation      CM=Additional comments  R=Reference range differs from displayed range      Result Care Coordination   Patient Communication   Add Comments   Add Notifications  Back to Top       Other Results from 02/04/2022  Magnesium Order: [id number] Status: Final result    Visible to patient: Yes (not seen)    Next appt: 05/13/2022 at 02:15 PM in Oncology ([facility])    Dx: CLL (chronic lymphocytic leukemia) ([place])    0 Result Notes           Component Ref Range & Units 3 mo ago (02/04/22) 6 mo ago (10/31/21) 9 mo ago (07/30/21) 11 mo ago (06/10/21) 1 yr ago (04/25/21) 1 yr ago (03/21/21) 1 yr ago (02/19/21)  Magnesium 1.7 - 2.4 mg/dL 2.2  1.9 CM  1.9 CM  1.9 CM  2.1 CM  2.0 CM  2.0 CM   Comment: Performed at [facility], [address] [place], [place] [address]  Resulting Agency  [facility] [place] [facility] [place] [facility] [place] [facility] [place] [facility] [place] [facility] [place] [facility]         Specimen Collected: 02/04/22 13:55 Last Resulted: 02/04/22 14:22      Lab Flowsheet    Order Details    View Encounter    Lab and Collection Details    Routing    Result History    View Encounter Conversation      CM=Additional comments      Result Care Coordination   Patient Communication   Add Comments   Add Notifications  Back to Top          Contains abnormal data Comprehensive metabolic panel Order: [id number] Status: Final result    Visible to patient: Yes (not seen)    Next  appt: 05/13/2022 at 02:15 PM in Oncology ([facility])    Dx: CLL (chronic lymphocytic leukemia) ([place])    0 Result Notes           Component Ref Range & Units 3 mo ago (02/04/22) 6 mo ago (10/31/21) 7 mo ago (09/10/21) 8 mo ago (08/16/21) 9 mo ago (07/30/21) 9 mo ago (07/29/21) 11 mo ago (06/10/21)  Sodium 135 - 145 mmol/L 136  136  140  139  138   137   Potassium 3.5 - 5.1 mmol/L 4.4  4.5  4.7  4.0  4.2   3.9   Chloride 98 - 111 mmol/L 106  104  109  109  105   106   CO2 22 - 32 mmol/L 21 Low   '24  26  24  24   24   '$ Glucose, Bld 70 - 99 mg/dL 131 High   91 CM  88 CM  91 CM  95 CM   113  High  CM   Comment: Glucose reference range applies only to samples taken after fasting for at least 8 hours.  BUN 8 - 23 mg/dL '17  22  15  18  17   14   '$ Creatinine, Ser 0.44 - 1.00 mg/dL 1.10 High   1.03 High   0.85  1.07 High   1.00  1.00  0.90   Calcium 8.9 - 10.3 mg/dL 8.8 Low   8.7 Low   9.3  9.0  9.3   8.7 Low    Total Protein 6.5 - 8.1 g/dL 6.7  6.6  6.5  6.8  6.4 Low    5.9 Low    Albumin 3.5 - 5.0 g/dL 4.0  3.9  3.8  3.9  3.8   3.6   AST 15 - 41 U/L '22  21  20  22  29   21   '$ ALT 0 - 44 U/L '21  18  19  30  19   16   '$ Alkaline Phosphatase 38 - 126 U/L 39  40  40  50  41   43   Total Bilirubin 0.3 - 1.2 mg/dL 0.5  0.2 Low   0.7  0.5  0.6   0.6   GFR, Estimated >60 mL/min 49 Low   53 Low  CM  >60 CM  51 Low  CM  56 Low  CM   >60 CM   Comment: (NOTE)  Calculated using the CKD-EPI Creatinine Equation (2021)   Anion gap 5 - '15 9  8 '$ CM  5 CM  6 CM  9 CM   7 CM   Comment: Performed at [facility], [address] [place], [place] [address]  Resulting Agency  [facility] [place] [facility] [place] [facility] [place] [facility] [place] [facility] [place] [facility] [place] [facility]         Specimen Collected: 02/04/22 13:55 Last Resulted: 02/04/22 14:22

## 2022-05-07 ENCOUNTER — Ambulatory Visit: Payer: Medicare HMO | Admitting: Oncology

## 2022-05-07 ENCOUNTER — Other Ambulatory Visit: Payer: Medicare HMO

## 2022-05-07 ENCOUNTER — Telehealth: Payer: Self-pay | Admitting: Oncology

## 2022-05-07 ENCOUNTER — Telehealth: Payer: Self-pay | Admitting: Pharmacy Technician

## 2022-05-07 ENCOUNTER — Other Ambulatory Visit (HOSPITAL_COMMUNITY): Payer: Self-pay

## 2022-05-07 NOTE — Telephone Encounter (Signed)
pt daughter [name] called in to move pt appts to following week.Marland Kitchen KJ

## 2022-05-08 ENCOUNTER — Inpatient Hospital Stay: Payer: Medicare HMO

## 2022-05-08 ENCOUNTER — Inpatient Hospital Stay: Payer: Medicare HMO | Admitting: Oncology

## 2022-05-09 ENCOUNTER — Encounter: Payer: Self-pay | Admitting: Urology

## 2022-05-09 NOTE — Telephone Encounter (Signed)
See mychart message. Spoke with daughter, advised daughter to [place]. I also advised patients daughter to call her pcp as pt needs a follow up for her symptoms.

## 2022-05-12 ENCOUNTER — Encounter: Payer: Self-pay | Admitting: Urology

## 2022-05-13 ENCOUNTER — Other Ambulatory Visit: Payer: Medicare HMO

## 2022-05-13 ENCOUNTER — Ambulatory Visit: Payer: Medicare HMO | Admitting: Oncology

## 2022-05-13 ENCOUNTER — Telehealth: Payer: Self-pay | Admitting: Urology

## 2022-05-13 NOTE — Telephone Encounter (Signed)
Spoke with Mrs. Megan Santana, [name]' daughter, regarding her symptoms of episodes of garbled speech, weakness, diarrhea, shortness of breath and dizziness and explained to her that this would better be worked up in an emergent type setting versus an office setting.  I expressed my concerns that she may be suffering from an impending stroke or other nonurological issues.  I also explained that even if her urine did appear to be infected, it would not cause these other symptoms that she is experiencing.  I also mentioned the Imbruvica also has a side effect profile similar to her symptoms as well.  I recommended to seek treatment in the ED at this time.

## 2022-05-14 ENCOUNTER — Ambulatory Visit: Payer: Medicare HMO | Admitting: Urology

## 2022-05-16 ENCOUNTER — Other Ambulatory Visit: Payer: Self-pay

## 2022-05-16 ENCOUNTER — Encounter: Payer: Self-pay | Admitting: Urology

## 2022-05-16 DIAGNOSIS — C9110 Chronic lymphocytic leukemia of B-cell type not having achieved remission: Secondary | ICD-10-CM

## 2022-05-19 NOTE — Telephone Encounter (Signed)
Oral Oncology Patient Advocate Encounter  Received notification from [employer] that patient has been temporarily enrolled into their program from 05/08/22-07/07/22. Patient must sign a Medicare D attestation form to receive approval until 12/14/22.  Patient will receive Imbruvica from the manufacturer at $0 out of pocket.    I called and spoke with patient.  She knows we will have to re-apply.   Specialty Pharmacy that will dispense medication is [facility].  Patient knows to call the office with questions or concerns.   Oral Oncology Clinic will continue to follow.  [place] Patient [place] Phone [phone] Fax [phone] 05/19/2022 11:37 AM

## 2022-05-19 NOTE — Telephone Encounter (Signed)
Oral Oncology Patient Advocate Encounter  Met patient in [facility] lobby to complete application for [employer] in an effort to reduce patient's out of pocket expense for Imbruvica to $0.    Application completed and faxed to [phone] on 05/07/22.   Wynetta Emery and [place] ([employer]) phone number for follow up is [phone].   This encounter will be updated until final determination.   [place] Patient [place] Phone [phone] Fax [phone]

## 2022-05-19 NOTE — Progress Notes (Deleted)
[place]  [phone] [phone]  ID: Megan Santana OB: 12-Oct-1934  MR#: [id number]  WPY#:[id number]  Patient Care Team: Lynnell Jude, MD as PCP - General (Family Medicine) Rico Junker, RN as Oncology Nurse Navigator Lloyd Huger, MD as Consulting Physician (Oncology) Clemmie Krill Lynnell Jude, MD as Referring Physician (Family Medicine) Bary Castilla Forest Gleason, MD (General Surgery) Noreene Filbert, MD as Referring Physician (Radiation Oncology)   CHIEF COMPLAINT:  CLL, q13 deletion.  DCIS left breast  INTERVAL HISTORY: Patient returns to clinic today for repeat laboratory can routine 53-monthevaluation.  She is tolerating Imbruvica and tamoxifen well without significant side effects.  She currently feels well and is asymptomatic.  She has no neurologic complaints. She denies any recent fevers or illnesses.  She denies any night sweats or unintentional weight loss.  She has no chest pain, shortness of breath, cough, or hemoptysis.  She denies any nausea, vomiting, constipation, or diarrhea. She has no urinary complaints.  Patient offers no specific complaints today.  REVIEW OF SYSTEMS:   Review of Systems  Constitutional: Negative.  Negative for fever, malaise/fatigue and weight loss.  Respiratory: Negative.  Negative for cough, hemoptysis and shortness of breath.   Cardiovascular: Negative.  Negative for chest pain and leg swelling.  Gastrointestinal: Negative.  Negative for abdominal pain and constipation.  Genitourinary: Negative.  Negative for dysuria and flank pain.  Musculoskeletal: Negative.  Negative for back pain and joint pain.  Skin: Negative.  Negative for rash.  Neurological: Negative.  Negative for sensory change, focal weakness, weakness and headaches.  Psychiatric/Behavioral: Negative.  The patient is not nervous/anxious and does not have insomnia.    As per HPI. Otherwise, a complete review of systems is negative.  PAST  MEDICAL HISTORY: Past Medical History:  Diagnosis Date   Arthritis    [place]   [place]   [place]   16 mm invasive mammary carcinoma, left upper outer quadrant, T1c, N0, triple negative, histologic grade 3.   Family history of adverse reaction to anesthesia    mom n/v   GERD (gastroesophageal reflux disease)    Heart murmur    Lumbar radiculitis    Lumbar stenosis    Personal history of chemotherapy 2018   Left   Personal history of radiation therapy 2007   Right   Personal history of radiation therapy 2018   Left    PAST SURGICAL HISTORY: Past Surgical History:  Procedure Laterality Date   ABDOMINAL HYSTERECTOMY     total   BACK SURGERY     lumbar   BREAST BIOPSY Bilateral 03/31/2017   bilat u/s core INVASIVE MAMMARY CARCINOMA   BREAST BIOPSY Right 03/31/2017   DENSE FIBROSIS AND SHEETS OF MACROPHAGES   BREAST EXCISIONAL BIOPSY Left 2018   BREAST LUMPECTOMY Right 2007   BREAST LUMPECTOMY Left 04/23/2017   BREAST LUMPECTOMY Left 04/23/2017   Procedure: BREAST LUMPECTOMY WITH EXCISION OF SENTINEL NODE;  Surgeon: BRobert Bellow MD;  Whole breast radiation ORS;  Service: General;  Laterality: Left;   BREAST SURGERY Right    Wide excision   COLONOSCOPY     EYE SURGERY     cataracts bil   FRACTURE SURGERY Left 2015 or 2016   INCISION AND DRAINAGE ABSCESS Left 03/27/2021   Procedure: debridement left chest wall and seroma drainage;  Surgeon: BRobert Bellow MD;  Location: [facility] ORS;  Service: General;  Laterality: Left;   MASTECTOMY Left 02/24/2021  SIMPLE MASTECTOMY WITH AXILLARY SENTINEL NODE BIOPSY Left 02/25/2021   Procedure: SIMPLE MASTECTOMY WITH AXILLARY SENTINEL NODE BIOPSY;  Surgeon: Robert Bellow, MD;  Location: [facility] ORS;  Service: General;  Laterality: Left;   TUMOR EXCISION  2001    FAMILY HISTORY: Family History  Problem Relation Age of Onset   Breast cancer Sister 75   Lung cancer Mother 57        mets to breast   Lung cancer Father 59   Heart attack Brother    Breast cancer Maternal Aunt        age unknown   Stroke Sister 33    ADVANCED DIRECTIVES (Y/N):  N  HEALTH MAINTENANCE: Social History   Tobacco Use   Smoking status: Former    Packs/day: 0.25    Years: 10.00    Pack years: 2.50    Types: Cigarettes    Quit date: 1970    Years since quitting: 53.4   Smokeless tobacco: Never  Vaping Use   Vaping Use: Never used  Substance Use Topics   Alcohol use: Not Currently   Drug use: No     Colonoscopy:  PAP:  Bone density:  Lipid panel:  Allergies  Allergen Reactions   Oxycodone Other (See Comments)    Can't sleep, feels bad when taking.    Current Outpatient Medications  Medication Sig Dispense Refill   acetaminophen (TYLENOL) 500 MG tablet Take 1,000 mg by mouth every 6 (six) hours as needed (for pain.).     albuterol (VENTOLIN HFA) 108 (90 Base) MCG/ACT inhaler Inhale 2 puffs into the lungs every 4 (four) hours as needed.     calcium carbonate (TUMS EX) 750 MG chewable tablet Chew 1-2 tablets by mouth 3 (three) times daily as needed (for heartburn/indigestion).     ciprofloxacin (CIPRO) 250 MG tablet Take 1 tablet (250 mg total) by mouth 2 (two) times daily. 14 tablet 0   ibrutinib (IMBRUVICA) 420 MG tablet TAKE 1 TABLET (420 MG) BY MOUTH DAILY. 28 tablet 3   Magnesium Oxide 250 MG TABS Take 250 mg by mouth daily.      Multiple Minerals-Vitamins (CAL MAG ZINC +D3 PO) Take 1 tablet by mouth daily.     Multiple Vitamins-Minerals (CENTRUM SILVER 50+WOMEN) TABS Take 1 tablet by mouth daily.     nitrofurantoin, macrocrystal-monohydrate, (MACROBID) 100 MG capsule Take 1 capsule (100 mg total) by mouth daily. 30 capsule 11   Potassium 99 MG TABS Take 99 mg by mouth daily.      tamoxifen (NOLVADEX) 20 MG tablet TAKE 1 TABLET BY MOUTH EVERY DAY 90 tablet 1   traMADol (ULTRAM) 50 MG tablet Take by mouth every 6 (six) hours as needed.     Vibegron (GEMTESA) 75  MG TABS Take 75 mg by mouth daily. 30 tablet 11   No current facility-administered medications for this visit.    OBJECTIVE: There were no vitals filed for this visit.     There is no height or weight on file to calculate BMI.    ECOG FS:0 - Asymptomatic  General: Well-developed, well-nourished, no acute distress. Eyes: Pink conjunctiva, anicteric sclera. HEENT: Normocephalic, moist mucous membranes. Breast: Exam deferred today. Lungs: No audible wheezing or coughing. Heart: Regular rate and rhythm. Abdomen: Soft, nontender, no obvious distention. Musculoskeletal: No edema, cyanosis, or clubbing. Neuro: Alert, answering all questions appropriately. Cranial nerves grossly intact. Skin: No rashes or petechiae noted. Psych: Normal affect.  LAB RESULTS:  Lab Results  Component Value Date  NA 136 02/04/2022   K 4.4 02/04/2022   CL 106 02/04/2022   CO2 21 (L) 02/04/2022   GLUCOSE 131 (H) 02/04/2022   BUN 17 02/04/2022   CREATININE 1.10 (H) 02/04/2022   CALCIUM 8.8 (L) 02/04/2022   PROT 6.7 02/04/2022   ALBUMIN 4.0 02/04/2022   AST 22 02/04/2022   ALT 21 02/04/2022   ALKPHOS 39 02/04/2022   BILITOT 0.5 02/04/2022   GFRNONAA 49 (L) 02/04/2022   GFRAA >60 09/10/2020    Lab Results  Component Value Date   WBC 9.5 02/04/2022   NEUTROABS 5.0 02/04/2022   HGB 12.1 02/04/2022   HCT 36.0 02/04/2022   MCV 90.0 02/04/2022   PLT 155 02/04/2022     STUDIES: No results found.  ASSESSMENT:  CLL, q13 deletion.  DCIS left breast  PLAN:    1. CLL, q13 deletion: CT scan results from February 04, 2021 reviewed independently with essential resolution of extensive lymphadenopathy throughout the chest, abdomen, pelvis. Patient initiated 420 mg Imbruvica in October 2021 after a reaction to Rituxan.  Treatment was discontinued temporarily surrounding her breast surgery.  Patient is now back on Imbruvica and her white count is now within normal limits.  Repeat imaging on July 29, 2021 reviewed independently and reported as above significant improvement of patient's lymphadenopathy in her chest, abdomen, and pelvis.  She has no splenomegaly.  No further imaging is needed unless there is suspicion of progression of disease.  Continue Imbruvica as prescribed.  Return to clinic in 3 months with repeat laboratory and further evaluation.   2.  Left breast DCIS: Patient underwent total mastectomy on February 25, 2021 confirming diagnosis.  She did not require adjuvant XRT.  Patient's most recent right unilateral screening mammogram was on September 24, 2021 and reported as BI-RADS 2.  Continue tamoxifen for total 5 years completing treatment in May 2027.  3.  History of pathologic stage IB triple negative invasive carcinoma of the upper outer quadrant of the left breast: Patient's previous breast cancer was in her right breast and was ER/PR positive. This was likely a second primary.  Patient completed 4 cycles of Taxotere and Cytoxan August 06, 2017.  Also completed adjuvant XRT.  An aromatase inhibitor would not be of any benefit given the ER/PR status of her tumor.   3. 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 Oncotype score 17: Originally diagnosed in 2008. Patient completed 5 years of Arimidex in approximately August 2013. Biopsy of the right breast only revealed fat necrosis at the site of her previous MammoSite.  Mammogram and possible biopsy as above. 4. Genetic testing: Patient has a sister and mother both with breast cancer. 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 5.  Anxiety: Chronic and unchanged.  Continue Xanax as needed. 6.  Anemia: Resolved.   7.  Thrombocytopenia: Resolved. 8.  Urogynecology: Significantly improved.  Continue treatment and follow-up per gynecology and urology.  Patient expressed understanding and was in agreement with this plan. She also  understands that She can call clinic at any time with any questions, concerns, or complaints.    Cancer Staging  CLL (chronic lymphocytic leukemia) ([place]) Staging form: Chronic Lymphocytic Leukemia / Small Lymphocytic Lymphoma, AJCC 8th Edition - Clinical stage from 03/14/2021: Modified Rai Stage I (Modified Rai risk: Intermediate, Lymphocytosis: Present, Adenopathy: Present, Organomegaly: Absent, Anemia: Absent, Thrombocytopenia: Absent) - Signed by Lloyd Huger,  MD on 03/14/2021 Stage prefix: Initial diagnosis  Carcinoma of upper-outer quadrant of left breast in female, estrogen receptor negative ([place]) Staging form: Breast, AJCC 8th Edition - Clinical stage from 04/08/2017: Stage IB (cT1c, cN0, cM0, G3, ER-, PR-, HER2-) - Signed by Lloyd Huger, MD on 04/08/2017 Histologic grading system: 3 grade system Laterality: Right - Pathologic stage from 05/11/2017: Stage IB (pT1c, pN0, cM0, G3, ER-, PR-, HER2-) - Signed by Lloyd Huger, MD on 05/11/2017 Neoadjuvant therapy: No Histologic grading system: 3 grade system Laterality: Left  Ductal carcinoma in situ (DCIS) of left breast Staging form: Breast, AJCC 8th Edition - Clinical stage from 03/14/2021: Stage 0 (cTis (DCIS), cN0, cM0, ER+, PR+, HER2-) - Signed by Lloyd Huger, MD on 03/14/2021 Nuclear grade: Patrecia Pour, MD   05/19/2022 9:58 AM

## 2022-05-20 ENCOUNTER — Inpatient Hospital Stay: Payer: Medicare HMO | Admitting: Oncology

## 2022-05-20 ENCOUNTER — Encounter: Payer: Self-pay | Admitting: Oncology

## 2022-05-20 ENCOUNTER — Inpatient Hospital Stay: Payer: Medicare HMO

## 2022-05-20 DIAGNOSIS — D0512 Intraductal carcinoma in situ of left breast: Secondary | ICD-10-CM

## 2022-05-20 DIAGNOSIS — C9110 Chronic lymphocytic leukemia of B-cell type not having achieved remission: Secondary | ICD-10-CM

## 2022-05-21 ENCOUNTER — Telehealth: Payer: Self-pay

## 2022-05-21 NOTE — Telephone Encounter (Signed)
Patient missed appointment yesterday due to being in the hospital. Daughter would like to reschedule lab and MD appointment for sometime next week if there is availability. Can you call daughter to schedule please. She can be scheduled for Wednesday 6/14 at 11:15. Thank you!

## 2022-05-26 ENCOUNTER — Encounter: Payer: Self-pay | Admitting: Obstetrics and Gynecology

## 2022-05-26 ENCOUNTER — Other Ambulatory Visit (HOSPITAL_COMMUNITY): Payer: Self-pay

## 2022-05-26 ENCOUNTER — Ambulatory Visit: Payer: Medicare HMO | Admitting: Obstetrics and Gynecology

## 2022-05-26 VITALS — BP 117/73 | HR 84

## 2022-05-26 DIAGNOSIS — N8110 Cystocele, unspecified: Secondary | ICD-10-CM

## 2022-05-26 DIAGNOSIS — N952 Postmenopausal atrophic vaginitis: Secondary | ICD-10-CM

## 2022-05-26 MED ORDER — ESTRADIOL 2 MG VA RING: 2.0000 mg | VAGINAL_RING | VAGINAL | 4 refills | Status: AC

## 2022-05-26 NOTE — Progress Notes (Signed)
[facility]   Subjective:     Chief Complaint:  Chief Complaint  Patient presents with   Pessary Check    Megan Santana is a 88 y.o. female here for a pessary check. Pt said she has vaginal irritation and severe diarrhea.   History of Present Illness: Megan Santana is a 88 y.o. female with stage II pelvic organ prolapse, stress incontinence, and OAB who presents for a pessary check. She is using a size #6 incontinence ring with support pessary. The pessary has been working well and she has no complaints. Pt and daughter report she has more complaints of vaginal irritation. Has been using coconut oil for moisture (although not sure how often). She denies vaginal bleeding. Has been having diarrhea for the last several weeks- seen by PCP and undergoing workup. Concerned this may be contributing to irritation since she has been wiping more.   Past Medical History: Patient  has a past medical history of Arthritis, Breast cancer ([place]) (2007), Breast cancer ([place]) (03/31/2017), Family history of adverse reaction to anesthesia, GERD (gastroesophageal reflux disease), Heart murmur, Lumbar radiculitis, Lumbar stenosis, Personal history of chemotherapy (2018), Personal history of radiation therapy (2007), and Personal history of radiation therapy (2018).   Past Surgical History: She  has a past surgical history that includes Abdominal hysterectomy; Breast surgery (Right); Back surgery; Eye surgery; Fracture surgery (Left, 2015 or 2016); Tumor excision (2001); Breast lumpectomy (Right, 2007); Breast lumpectomy (Left, 04/23/2017); Breast lumpectomy (Left, 04/23/2017); Breast biopsy (Bilateral, 03/31/2017); Breast biopsy (Right, 03/31/2017); Breast excisional biopsy (Left, 2018); Colonoscopy; Simple mastectomy with axillary sentinel node biopsy (Left, 02/25/2021); Incision and drainage abscess (Left, 03/27/2021); and Mastectomy (Left, 02/24/2021).   Medications: She has a current medication  list which includes the following prescription(s): albuterol, aspirin ec, atorvastatin, estradiol, ibrutinib, mirtazapine, gemtesa, calcium carbonate, magnesium oxide, multiple minerals-vitamins, and centrum silver 50+women.   Allergies: Patient is allergic to oxycodone.   Social History: Patient  reports that she quit smoking about 53 years ago. Her smoking use included cigarettes. She has a 2.50 pack-year smoking history. She has never used smokeless tobacco. She reports that she does not currently use alcohol. She reports that she does not use drugs.      Objective:    Physical Exam: BP 117/73   Pulse 84  Gen: No apparent distress, A&O x 3. Detailed Urogynecologic Evaluation:  Pelvic Exam: Normal external female genitalia; Bartholin's and Skene's glands normal in appearance; urethral meatus normal in appearance, no urethral masses or discharge. The pessary was noted to be in place. It was removed and cleaned. Speculum exam revealed no lesions in the vagina. The pessary was replaced. It was comfortable to the patient and fit well.  POP-Q:    POP-Q   0                                            Aa   0                                           Ba   -4  C    5                                            Gh   5                                            Pb   7.5                                            tvl    -2                                            Ap   -2                                            Bp                                                  D         Assessment/Plan:    Assessment: Ms. [place] is a 88 y.o. with stage II pelvic organ prolapse here for a pessary check.   Plan: - Continue incontinence ring pessary. Return 3 months for cleaning.  - Suspect atrophy is causing irritation. Reviewed options other than coconut oil and recommend vaginal estrogen. We reviewed there is low systemic absorption so should  not affect recurrence of breast cancer. Prescribed estring vaginal ring. They will bring to next pessary cleaning for insertion.   Jaquita Folds, MD

## 2022-05-27 ENCOUNTER — Inpatient Hospital Stay: Payer: Medicare HMO | Admitting: Oncology

## 2022-05-27 ENCOUNTER — Inpatient Hospital Stay: Payer: Medicare HMO | Attending: Oncology

## 2022-05-27 ENCOUNTER — Other Ambulatory Visit: Payer: Self-pay

## 2022-05-27 ENCOUNTER — Encounter: Payer: Self-pay | Admitting: Oncology

## 2022-05-27 VITALS — BP 117/77 | HR 85 | Temp 98.8°F | Resp 16 | Ht 62.0 in | Wt 148.0 lb

## 2022-05-27 DIAGNOSIS — D649 Anemia, unspecified: Secondary | ICD-10-CM | POA: Insufficient documentation

## 2022-05-27 DIAGNOSIS — C50412 Malignant neoplasm of upper-outer quadrant of left female breast: Secondary | ICD-10-CM

## 2022-05-27 DIAGNOSIS — C9110 Chronic lymphocytic leukemia of B-cell type not having achieved remission: Secondary | ICD-10-CM | POA: Insufficient documentation

## 2022-05-27 DIAGNOSIS — F419 Anxiety disorder, unspecified: Secondary | ICD-10-CM | POA: Diagnosis not present

## 2022-05-27 DIAGNOSIS — Z79899 Other long term (current) drug therapy: Secondary | ICD-10-CM | POA: Insufficient documentation

## 2022-05-27 DIAGNOSIS — Z923 Personal history of irradiation: Secondary | ICD-10-CM | POA: Diagnosis not present

## 2022-05-27 DIAGNOSIS — Z823 Family history of stroke: Secondary | ICD-10-CM | POA: Insufficient documentation

## 2022-05-27 DIAGNOSIS — Z9221 Personal history of antineoplastic chemotherapy: Secondary | ICD-10-CM | POA: Insufficient documentation

## 2022-05-27 DIAGNOSIS — D696 Thrombocytopenia, unspecified: Secondary | ICD-10-CM | POA: Diagnosis not present

## 2022-05-27 DIAGNOSIS — Z171 Estrogen receptor negative status [ER-]: Secondary | ICD-10-CM | POA: Diagnosis not present

## 2022-05-27 DIAGNOSIS — Z7982 Long term (current) use of aspirin: Secondary | ICD-10-CM | POA: Insufficient documentation

## 2022-05-27 LAB — CBC WITH DIFFERENTIAL/PLATELET
Abs Immature Granulocytes: 0.08 10*3/uL — ABNORMAL HIGH (ref 0.00–0.07)
Basophils Absolute: 0.1 10*3/uL (ref 0.0–0.1)
Basophils Relative: 1 %
Eosinophils Absolute: 0.1 10*3/uL (ref 0.0–0.5)
Eosinophils Relative: 2 %
HCT: 33.4 % — ABNORMAL LOW (ref 36.0–46.0)
Hemoglobin: 11.1 g/dL — ABNORMAL LOW (ref 12.0–15.0)
Immature Granulocytes: 1 %
Lymphocytes Relative: 20 %
Lymphs Abs: 1.2 10*3/uL (ref 0.7–4.0)
MCH: 29.1 pg (ref 26.0–34.0)
MCHC: 33.2 g/dL (ref 30.0–36.0)
MCV: 87.7 fL (ref 80.0–100.0)
Monocytes Absolute: 0.9 10*3/uL (ref 0.1–1.0)
Monocytes Relative: 16 %
Neutro Abs: 3.5 10*3/uL (ref 1.7–7.7)
Neutrophils Relative %: 60 %
Platelets: 147 10*3/uL — ABNORMAL LOW (ref 150–400)
RBC: 3.81 MIL/uL — ABNORMAL LOW (ref 3.87–5.11)
RDW: 14.8 % (ref 11.5–15.5)
Smear Review: NORMAL
WBC: 5.8 10*3/uL (ref 4.0–10.5)
nRBC: 0 % (ref 0.0–0.2)

## 2022-05-27 LAB — COMPREHENSIVE METABOLIC PANEL WITH GFR
ALT: 18 U/L (ref 0–44)
AST: 18 U/L (ref 15–41)
Albumin: 3.1 g/dL — ABNORMAL LOW (ref 3.5–5.0)
Alkaline Phosphatase: 74 U/L (ref 38–126)
Anion gap: 10 (ref 5–15)
BUN: 22 mg/dL (ref 8–23)
CO2: 17 mmol/L — ABNORMAL LOW (ref 22–32)
Calcium: 8.2 mg/dL — ABNORMAL LOW (ref 8.9–10.3)
Chloride: 107 mmol/L (ref 98–111)
Creatinine, Ser: 1.29 mg/dL — ABNORMAL HIGH (ref 0.44–1.00)
GFR, Estimated: 41 mL/min — ABNORMAL LOW
Glucose, Bld: 117 mg/dL — ABNORMAL HIGH (ref 70–99)
Potassium: 4.3 mmol/L (ref 3.5–5.1)
Sodium: 134 mmol/L — ABNORMAL LOW (ref 135–145)
Total Bilirubin: 0.8 mg/dL (ref 0.3–1.2)
Total Protein: 6.2 g/dL — ABNORMAL LOW (ref 6.5–8.1)

## 2022-05-27 LAB — MAGNESIUM: Magnesium: 1.8 mg/dL (ref 1.7–2.4)

## 2022-05-27 LAB — PHOSPHORUS: Phosphorus: 3.6 mg/dL (ref 2.5–4.6)

## 2022-05-27 NOTE — Telephone Encounter (Signed)
Patient signed attestation for [facility] today at appointment.  Faxed to [facility] at [phone].  [place] [facility] [place] Phone [phone] Fax [phone] 05/27/2022 3:08 PM

## 2022-05-27 NOTE — Progress Notes (Signed)
[place]  [phone] Fax:(336) [phone]  ID: Megan Santana OB: February 05, 1934  MR#: [id number]  MLY#:[id number]  Patient Care Team: Lynnell Jude, MD as PCP - General (Family Medicine) Rico Junker, RN as Oncology Nurse Navigator Lloyd Huger, MD as Consulting Physician (Oncology) Clemmie Krill Lynnell Jude, MD as Referring Physician (Family Medicine) Bary Castilla, Forest Gleason, MD (General Surgery) Noreene Filbert, MD as Referring Physician (Radiation Oncology)   CHIEF COMPLAINT:  CLL, q13 deletion.  DCIS left breast  INTERVAL HISTORY: Patient returns to clinic today for repeat laboratory work and routine 79-monthevaluation.  [facility].  She continues to have problems with memory, but otherwise is improving since discharge.  Tamoxifen was discontinued.  She is now back on Imbruvica and tolerating treatment well.  She denies any fevers.  She denies any night sweats or unintentional weight loss.  She has no chest pain, shortness of breath, cough, or hemoptysis.  She denies any nausea, vomiting, constipation, or diarrhea. She has no urinary complaints.  Patient offers no further specific complaints today.  REVIEW OF SYSTEMS:   Review of Systems  Constitutional: Negative.  Negative for fever, malaise/fatigue and weight loss.  Respiratory: Negative.  Negative for cough, hemoptysis and shortness of breath.   Cardiovascular: Negative.  Negative for chest pain and leg swelling.  Gastrointestinal: Negative.  Negative for abdominal pain and constipation.  Genitourinary: Negative.  Negative for dysuria and flank pain.  Musculoskeletal: Negative.  Negative for back pain and joint pain.  Skin: Negative.  Negative for rash.  Neurological:  Positive for weakness. Negative for sensory change, focal weakness and headaches.  Psychiatric/Behavioral:  Positive for memory loss. The patient is not  nervous/anxious and does not have insomnia.     As per HPI. Otherwise, a complete review of systems is negative.  PAST MEDICAL HISTORY: Past Medical History:  Diagnosis Date   Arthritis    [place]   [place]   [place]   16 mm invasive mammary carcinoma, left upper outer quadrant, T1c, N0, triple negative, histologic grade 3.   Family history of adverse reaction to anesthesia    mom n/v   GERD (gastroesophageal reflux disease)    Heart murmur    Lumbar radiculitis    Lumbar stenosis    Personal history of chemotherapy 2018   Left   Personal history of radiation therapy 2007   Right   Personal history of radiation therapy 2018   Left    PAST SURGICAL HISTORY: Past Surgical History:  Procedure Laterality Date   ABDOMINAL HYSTERECTOMY     total   BACK SURGERY     lumbar   BREAST BIOPSY Bilateral 03/31/2017   bilat u/s core INVASIVE MAMMARY CARCINOMA   BREAST BIOPSY Right 03/31/2017   DENSE FIBROSIS AND SHEETS OF MACROPHAGES   BREAST EXCISIONAL BIOPSY Left 2018   BREAST LUMPECTOMY Right 2007   BREAST LUMPECTOMY Left 04/23/2017   BREAST LUMPECTOMY Left 04/23/2017   Procedure: BREAST LUMPECTOMY WITH EXCISION OF SENTINEL NODE;  Surgeon: BRobert Bellow MD;  Whole breast radiation ORS;  Service: General;  Laterality: Left;   BREAST SURGERY Right    Wide excision   COLONOSCOPY     EYE SURGERY     cataracts bil   FRACTURE SURGERY Left 2015 or 2016   INCISION AND DRAINAGE ABSCESS Left 03/27/2021   Procedure: debridement left chest wall  and seroma drainage;  Surgeon: Robert Bellow, MD;  Location: [facility] ORS;  Service: General;  Laterality: Left;   MASTECTOMY Left 02/24/2021   SIMPLE MASTECTOMY WITH AXILLARY SENTINEL NODE BIOPSY Left 02/25/2021   Procedure: SIMPLE MASTECTOMY WITH AXILLARY SENTINEL NODE BIOPSY;  Surgeon: Robert Bellow, MD;  Location: [facility] ORS;  Service: General;  Laterality: Left;   TUMOR EXCISION   2001    FAMILY HISTORY: Family History  Problem Relation Age of Onset   Breast cancer Sister 39   Lung cancer Mother 40       mets to breast   Lung cancer Father 50   Heart attack Brother    Breast cancer Maternal Aunt        age unknown   Stroke Sister 104    ADVANCED DIRECTIVES (Y/N):  N  HEALTH MAINTENANCE: Social History   Tobacco Use   Smoking status: Former    Packs/day: 0.25    Years: 10.00    Total pack years: 2.50    Types: Cigarettes    Quit date: 1970    Years since quitting: 53.4   Smokeless tobacco: Never  Vaping Use   Vaping Use: Never used  Substance Use Topics   Alcohol use: Not Currently   Drug use: No     Colonoscopy:  PAP:  Bone density:  Lipid panel:  Allergies  Allergen Reactions   Oxycodone Other (See Comments)    Can't sleep, feels bad when taking.    Current Outpatient Medications  Medication Sig Dispense Refill   albuterol (VENTOLIN HFA) 108 (90 Base) MCG/ACT inhaler Inhale 2 puffs into the lungs every 4 (four) hours as needed.     aspirin EC 81 MG tablet Take 81 mg by mouth daily. Swallow whole.     atorvastatin (LIPITOR) 20 MG tablet Take 20 mg by mouth daily.     ibrutinib (IMBRUVICA) 420 MG tablet TAKE 1 TABLET (420 MG) BY MOUTH DAILY. 28 tablet 3   Loperamide HCl (IMODIUM PO) Take by mouth.     mirtazapine (REMERON) 15 MG tablet Take 15 mg by mouth at bedtime.     Vibegron (GEMTESA) 75 MG TABS Take 75 mg by mouth daily. 30 tablet 11   calcium carbonate (TUMS EX) 750 MG chewable tablet Chew 1-2 tablets by mouth 3 (three) times daily as needed (for heartburn/indigestion). (Patient not taking: Reported on 05/27/2022)     estradiol (ESTRING) 2 MG vaginal ring Place 2 mg vaginally every 3 (three) months. follow package directions (Patient not taking: Reported on 05/27/2022) 1 each 4   Magnesium Oxide 250 MG TABS Take 250 mg by mouth daily.  (Patient not taking: Reported on 05/26/2022)     Multiple Minerals-Vitamins (CAL MAG ZINC +D3 PO)  Take 1 tablet by mouth daily. (Patient not taking: Reported on 05/26/2022)     Multiple Vitamins-Minerals (CENTRUM SILVER 50+WOMEN) TABS Take 1 tablet by mouth daily. (Patient not taking: Reported on 05/26/2022)     No current facility-administered medications for this visit.    OBJECTIVE: Vitals:   05/27/22 1328  BP: 117/77  Pulse: 85  Resp: 16  Temp: 98.8 F (37.1 C)  SpO2: 99%      Body mass index is 27.07 kg/m.    ECOG FS:1 - Symptomatic but completely ambulatory  General: Well-developed, well-nourished, no acute distress. Eyes: Pink conjunctiva, anicteric sclera. HEENT: Normocephalic, moist mucous membranes. Lungs: No audible wheezing or coughing. Heart: Regular rate and rhythm. Abdomen: Soft, nontender, no obvious distention. Musculoskeletal:  No edema, cyanosis, or clubbing. Neuro: Alert, answering all questions appropriately. Cranial nerves grossly intact. Skin: No rashes or petechiae noted. Psych: Normal affect.  LAB RESULTS:  Lab Results  Component Value Date   NA 134 (L) 05/27/2022   K 4.3 05/27/2022   CL 107 05/27/2022   CO2 17 (L) 05/27/2022   GLUCOSE 117 (H) 05/27/2022   BUN 22 05/27/2022   CREATININE 1.29 (H) 05/27/2022   CALCIUM 8.2 (L) 05/27/2022   PROT 6.2 (L) 05/27/2022   ALBUMIN 3.1 (L) 05/27/2022   AST 18 05/27/2022   ALT 18 05/27/2022   ALKPHOS 74 05/27/2022   BILITOT 0.8 05/27/2022   GFRNONAA 41 (L) 05/27/2022   GFRAA >60 09/10/2020    Lab Results  Component Value Date   WBC 5.8 05/27/2022   NEUTROABS 3.5 05/27/2022   HGB 11.1 (L) 05/27/2022   HCT 33.4 (L) 05/27/2022   MCV 87.7 05/27/2022   PLT 147 (L) 05/27/2022     STUDIES: No results found.  ASSESSMENT:  CLL, q13 deletion.  DCIS left breast  PLAN:    1. CLL, q13 deletion: CT scan results from February 04, 2021 reviewed independently with essential resolution of extensive lymphadenopathy throughout the chest, abdomen, pelvis.  Patient initiated 420 mg Imbruvica in October  2021 after a reaction to Rituxan.  Treatment was discontinued temporarily surrounding her breast surgery.  Patient is now back on Imbruvica.  White blood cell count continues to be within normal limits.  Imaging on July 29, 2021 reviewed independently revealed significant improvement of patient's lymphadenopathy in her chest, abdomen, and pelvis.  Recent CT scan at [facility] suggested progression of disease, but had no other imaging for comparison.  She has no splenomegaly.  Repeat imaging in August 2023.  Return to clinic 1 to 2 days later to discuss the results.   2.  Left breast DCIS: Patient underwent total mastectomy on February 25, 2021 confirming diagnosis.  She did not require adjuvant XRT.  Patient's most recent right unilateral screening mammogram was on September 24, 2021 and reported as BI-RADS 2.  Repeat in October 2023.  Tamoxifen has now been discontinued given her recent ischemic stroke.  3.  History of pathologic stage IB triple negative invasive carcinoma of the upper outer quadrant of the left breast: Patient's previous breast cancer was in her right breast and was ER/PR positive. This was likely a second primary.  Patient completed 4 cycles of Taxotere and Cytoxan August 06, 2017.  Also completed adjuvant XRT.  An aromatase inhibitor would not be of any benefit given the ER/PR status of her tumor.   3. 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 Oncotype score 17: Originally diagnosed in 2008. Patient completed 5 years of Arimidex in approximately August 2013. Biopsy of the right breast only revealed fat necrosis at the site of her previous MammoSite.  Mammogram and possible biopsy as above. 4. Genetic testing: Patient has a sister and mother both with breast cancer. 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 5.  Anxiety: Chronic and unchanged.  Continue Xanax as needed. 6.  Anemia: Mild,  monitor.  Patient's hemoglobin is 11.1. 7.  Thrombocytopenia: Mild.  Patient's platelet count is 147. 8.  Urogynecology: Significantly improved.  Continue treatment and follow-up per gynecology and urology. 9.  Ischemic stroke: Patient has residual short-term memory complaints.  Follow-up with neurology as indicated.  Patient expressed understanding and  was in agreement with this plan. She also understands that She can call clinic at any time with any questions, concerns, or complaints.    Cancer Staging  CLL (chronic lymphocytic leukemia) ([place]) Staging form: Chronic Lymphocytic Leukemia / Small Lymphocytic Lymphoma, AJCC 8th Edition - Clinical stage from 03/14/2021: Modified Rai Stage I (Modified Rai risk: Intermediate, Lymphocytosis: Present, Adenopathy: Present, Organomegaly: Absent, Anemia: Absent, Thrombocytopenia: Absent) - Signed by Lloyd Huger, MD on 03/14/2021 Stage prefix: Initial diagnosis  Carcinoma of upper-outer quadrant of left breast in female, estrogen receptor negative ([place]) Staging form: Breast, AJCC 8th Edition - Clinical stage from 04/08/2017: Stage IB (cT1c, cN0, cM0, G3, ER-, PR-, HER2-) - Signed by Lloyd Huger, MD on 04/08/2017 Histologic grading system: 3 grade system Laterality: Right - Pathologic stage from 05/11/2017: Stage IB (pT1c, pN0, cM0, G3, ER-, PR-, HER2-) - Signed by Lloyd Huger, MD on 05/11/2017 Neoadjuvant therapy: No Histologic grading system: 3 grade system Laterality: Left  Ductal carcinoma in situ (DCIS) of left breast Staging form: Breast, AJCC 8th Edition - Clinical stage from 03/14/2021: Stage 0 (cTis (DCIS), cN0, cM0, ER+, PR+, HER2-) - Signed by Lloyd Huger, MD on 03/14/2021 Nuclear grade: Patrecia Pour, MD   05/27/2022 2:27 PM

## 2022-06-03 ENCOUNTER — Ambulatory Visit: Payer: Medicare HMO | Attending: Family Medicine

## 2022-06-03 ENCOUNTER — Encounter: Payer: Self-pay | Admitting: Occupational Therapy

## 2022-06-03 ENCOUNTER — Ambulatory Visit: Payer: Medicare HMO | Admitting: Occupational Therapy

## 2022-06-03 DIAGNOSIS — R262 Difficulty in walking, not elsewhere classified: Secondary | ICD-10-CM | POA: Diagnosis present

## 2022-06-03 DIAGNOSIS — R2681 Unsteadiness on feet: Secondary | ICD-10-CM

## 2022-06-03 DIAGNOSIS — M6281 Muscle weakness (generalized): Secondary | ICD-10-CM

## 2022-06-03 NOTE — Therapy (Signed)
OUTPATIENT OCCUPATIONAL THERAPY NEURO EVALUATION  Patient Name: Megan Santana MRN: [id number] DOB:December 13, 1934, 88 y.o., female Today's Date: 06/03/2022  PCP: Lavera Guise REFERRING PROVIDER: Lavera Guise   OT End of Session - 06/03/22 1009     Visit Number 1    Number of Visits 24    Date for OT Re-Evaluation 06/03/22    Authorization Time Period Progress reporting period starting 06/03/2022    OT Start Time 0900    OT Stop Time 0272    OT Time Calculation (min) 55 min             Past Medical History:  Diagnosis Date   Arthritis    Breast cancer ([place]) 2007   right lumpectomy   Breast cancer ([place]) 03/31/2017   16 mm invasive mammary carcinoma, left upper outer quadrant, T1c, N0, triple negative, histologic grade 3.   Family history of adverse reaction to anesthesia    mom n/v   GERD (gastroesophageal reflux disease)    Heart murmur    Lumbar radiculitis    Lumbar stenosis    Personal history of chemotherapy 2018   Left   Personal history of radiation therapy 2007   Right   Personal history of radiation therapy 2018   Left   Past Surgical History:  Procedure Laterality Date   ABDOMINAL HYSTERECTOMY     total   BACK SURGERY     lumbar   BREAST BIOPSY Bilateral 03/31/2017   bilat u/s core INVASIVE MAMMARY CARCINOMA   BREAST BIOPSY Right 03/31/2017   DENSE FIBROSIS AND SHEETS OF MACROPHAGES   BREAST EXCISIONAL BIOPSY Left 2018   BREAST LUMPECTOMY Right 2007   BREAST LUMPECTOMY Left 04/23/2017   BREAST LUMPECTOMY Left 04/23/2017   Procedure: BREAST LUMPECTOMY WITH EXCISION OF SENTINEL NODE;  Surgeon: Robert Bellow, MD;  Whole breast radiation ORS;  Service: General;  Laterality: Left;   BREAST SURGERY Right    Wide excision   COLONOSCOPY     EYE SURGERY     cataracts bil   FRACTURE SURGERY Left 2015 or 2016   INCISION AND DRAINAGE ABSCESS Left 03/27/2021   Procedure: debridement left chest wall and seroma drainage;  Surgeon: Robert Bellow, MD;   Location: [facility] ORS;  Service: General;  Laterality: Left;   MASTECTOMY Left 02/24/2021   SIMPLE MASTECTOMY WITH AXILLARY SENTINEL NODE BIOPSY Left 02/25/2021   Procedure: SIMPLE MASTECTOMY WITH AXILLARY SENTINEL NODE BIOPSY;  Surgeon: Robert Bellow, MD;  Location: [facility] ORS;  Service: General;  Laterality: Left;   TUMOR EXCISION  2001   Patient Active Problem List   Diagnosis Date Noted   Mixed urinary incontinence due to female genital prolapse 09/18/2021   Cystocele, midline 09/18/2021   Vaginal prolapse 09/18/2021   Ductal carcinoma in situ (DCIS) of left breast 03/14/2021   CLL (chronic lymphocytic leukemia) ([place]) 02/10/2020   Goals of care, counseling/discussion 05/11/2017   Carcinoma of upper-outer quadrant of left breast in female, estrogen receptor negative ([place]) 04/08/2017   Lumbar stenosis with neurogenic claudication 01/30/2015    Rationale for Evaluation and Treatment Rehabilitation  SUBJECTIVE:   SUBJECTIVE STATEMENT: Pt. 's daughter was present 3/4 of the way through the therapy session. Pt accompanied by: family member  PERTINENT HISTORY: Pt. is an 88 y.o. female who was admitted to [facility] from 05/19/2022 to 05/21/2022 with a Subacute Aschemic CVA in the LEft posterior temporal/occipital territory. Pt. Has supportive children who live close by. Pt.'s daughter has been staying with the patient  since hospitalization.   PRECAUTIONS: None  WEIGHT BEARING RESTRICTIONS No  PAIN:  Are you having pain? No    PATIENT GOALS : To regain independence  OBJECTIVE:     [facility] OT Assessment - 06/03/22 0904       Assessment   Medical Diagnosis CVA    Onset Date/Surgical Date 05/08/22    Hand Dominance Right    Next MD Visit Thursday      Precautions   Precautions Fall      Restrictions   Weight Bearing Restrictions No      Balance Screen   Has the patient fallen in the past 6 months No    Has the patient had a decrease in activity level because of a fear of  falling?  No    Is the patient reluctant to leave their home because of a fear of falling?  No      Home  Environment   Family/patient expects to be discharged to: Private residence    Living Arrangements Alone   Daughter is staying with the patient   Available Help at Discharge Family    Type of [place] One level    Bathroom Shower/Tub Tub/Shower unit    Shower/tub characteristics [place] bars - toilet;Walker - 2 wheels;Shower seat;Hand held shower head    Lives With Alone      Prior Function   Level of [place] Retired    Scientist, research (life sciences)    Leisure [employer], sports, being physically active      ADL   Eating/Feeding Independent    Grooming Independent    Arts development officer - Social research officer, government -  Product/process development scientist Min guard      IADL   Prior Level of [place] Needs to be accompanied on any shopping trip    Prior Level of [place] Does personal laundry completely;Performs light daily tasks but cannot maintain acceptable level of cleanliness    Prior Level of Function Meal Prep Independent    Meal Prep Able to complete simple cold meal and snack prep    Prior Level of Function Scientist, research (physical sciences) Relies on family or friends for transportation    Prior Level of Function Medication Managment Independent    Medication Management Has difficulty remembering to take medication    Prior Level of Function Financial Management Independent    Financial Management Requires assistance      Mobility    Mobility Status Needs assist      Written Expression   Dominant Hand Right    Handwriting 90% legible      Vision - History   Baseline Vision No visual deficits      Cognition   Overall Cognitive Status Within Functional Limits for tasks assessed  Family reports changes in Memory                Sensation   Light Touch Appears Intact      Coordination   Gross Motor Movements  are Fluid and Coordinated Yes    Fine Motor Movements are Fluid and Coordinated No    Right 9 Hole Peg Test 41    Left 9 Hole Peg Test 40      Hand Function   Right Hand Grip (lbs) 15    Right Hand Lateral Pinch 8 lbs    Right Hand 3 Point Pinch 7 lbs    Left Hand Grip (lbs) 23    Left Hand Lateral Pinch 10 lbs    Left 3 point pinch 10 lbs                   FUNCTIONAL OUTCOME MEASURES: FOTO: Eval 88 with TR score 92.   UPPER EXTREMITY MMT:     MMT Right eval Left eval  Shoulder flexion 4+/5 4+/5  Shoulder abduction 4+/5 4+/5  Shoulder adduction    Shoulder extension    Shoulder internal rotation    Shoulder external rotation    Middle trapezius    Lower trapezius    Elbow flexion 5/5 5/5  Elbow extension    Wrist flexion    Wrist extension 4+/5 4+/5  Wrist ulnar deviation    Wrist radial deviation    Wrist pronation    Wrist supination    (Blank rows = not tested)       PATIENT EDUCATION: Education details: OT services, POC, goals Person educated: Patient Education method: Explanation Education comprehension: needs further education   HOME EXERCISE PROGRAM: To be established    GOALS: Goals reviewed with patient? Yes  SHORT TERM GOALS: Target date:  07/15/2022                          1.   Pt. Improve FOTO score by 2 points for clinically significant perceived improvement with ADLs, and IADL tasks. Baseline: Eval 88 TR score 92 Goal status: INITIAL   LONG TERM GOALS: Target date: 08/26/2022    Pt. Will increase right grip strength by 2 pounds to assist  with opening jars and containers.  Baseline: R: 15, L: 23 Goal status: INITIAL  2.  Pt. Will in right pinch strength by 2 pounds to assist with opening packages. Baseline: R: 8, L: 10 Goal status: INITIAL  3.  BUE strength will increase by 2 mm grades to assist with ADLs, and IADLs. Baseline: bilateral shoulder flexion and abduction 4+/5, elbow flexion, extension 5/5, bilateral wrist extension 5/5 Goal status: INITIAL  4.  Pt. Will perform light meal preparation with supervision Baseline: Eval: Pt. Is able to perform light cold meal snack prep. Family now performing cooking Goal status: INITIAL  5.  Pt. Will perform light homemaking tasks with supervision. Baseline: Eval: Pt. Is able to perform light laundry tasks. Goal status: INITIAL  6.  Pt. will improve bilateral [facility] skills by 2 sec. To improve speed, and dexterity during ADLs, and IADLs. Baseline:  Goal status: INITIAL  ASSESSMENT:  CLINICAL IMPRESSION: Patient is an 88  y.o. female  who was seen today for occupational therapy evaluation for the initial evaluation. Pt. Presents with  decreased bilateral UE strength,  grip strength, pinch strength, memory changes, and [facility] skills which limit her ability to perform ADL, and IADL tasks. Pt. Will benefit from OT services for ADL training, there. Ex, neuromuscular re-education, and pt. education about cognitive compensatory strategies, home modification, and DME.   PERFORMANCE DEFICITS in functional skills including: ADLs, IADLs, strength, dexterity, cognitive skills including memory and  safety awareness, and psychosocial skills.   IMPAIRMENTS are limiting patient from ADLs, IADLs, leisure, and social participation.   COMORBIDITIES may have co-morbidities  that affects occupational performance. Patient will benefit from skilled OT to address above impairments and improve overall function.  MODIFICATION OR ASSISTANCE TO COMPLETE EVALUATION: Min-Moderate modification of tasks or  assist with assess necessary to complete an evaluation.  OT OCCUPATIONAL PROFILE AND HISTORY: Problem focused assessment: Including review of records relating to presenting problem.  CLINICAL DECISION MAKING: Moderate - several treatment options, min-mod task modification necessary  REHAB POTENTIAL: Good  EVALUATION COMPLEXITY: Moderate    PLAN: OT FREQUENCY: 1-2x/week  OT DURATION: 12 weeks  PLANNED INTERVENTIONS: self care/ADL training, therapeutic exercise, therapeutic activity, neuromuscular re-education, moist heat, patient/family education, cognitive remediation/compensation, and DME and/or AE instructions  RECOMMENDED OTHER SERVICES: PT  CONSULTED AND AGREED WITH PLAN OF CARE: Patient and family Midwife  Plan for next session Initiate treatments sessions   Harrel Carina, MS, OTR/L  Harrel Carina, OT 06/03/2022, 3:14 PM

## 2022-06-03 NOTE — Therapy (Signed)
[place] MAIN [facility] SERVICES [address] [place], [place], [address] Phone: [phone]   Fax:  [phone]  Physical Therapy Evaluation  Patient Details  Name: Megan Santana MRN: [id number] Date of Birth: 01-Jan-1934 Referring Provider (PT): Lynnell Jude   Encounter Date: 06/03/2022   PT End of Session - 06/03/22 0919     Visit Number 1    Number of Visits 24    Date for PT Re-Evaluation 08/26/22    Authorization Type 1/10 eval 6/20    PT Start Time 0800    PT Stop Time 0859    PT Time Calculation (min) 59 min    Equipment Utilized During Treatment Gait belt    Activity Tolerance Patient tolerated treatment well;Patient limited by fatigue    Behavior During Therapy [facility] for tasks assessed/performed             Past Medical History:  Diagnosis Date   Arthritis    Breast cancer ([place]) 2007   right lumpectomy   Breast cancer ([place]) 03/31/2017   16 mm invasive mammary carcinoma, left upper outer quadrant, T1c, N0, triple negative, histologic grade 3.   Family history of adverse reaction to anesthesia    mom n/v   GERD (gastroesophageal reflux disease)    Heart murmur    Lumbar radiculitis    Lumbar stenosis    Personal history of chemotherapy 2018   Left   Personal history of radiation therapy 2007   Right   Personal history of radiation therapy 2018   Left    Past Surgical History:  Procedure Laterality Date   ABDOMINAL HYSTERECTOMY     total   BACK SURGERY     lumbar   BREAST BIOPSY Bilateral 03/31/2017   bilat u/s core INVASIVE MAMMARY CARCINOMA   BREAST BIOPSY Right 03/31/2017   DENSE FIBROSIS AND SHEETS OF MACROPHAGES   BREAST EXCISIONAL BIOPSY Left 2018   BREAST LUMPECTOMY Right 2007   BREAST LUMPECTOMY Left 04/23/2017   BREAST LUMPECTOMY Left 04/23/2017   Procedure: BREAST LUMPECTOMY WITH EXCISION OF SENTINEL NODE;  Surgeon: Robert Bellow, MD;  Whole breast radiation ORS;  Service: General;   Laterality: Left;   BREAST SURGERY Right    Wide excision   COLONOSCOPY     EYE SURGERY     cataracts bil   FRACTURE SURGERY Left 2015 or 2016   INCISION AND DRAINAGE ABSCESS Left 03/27/2021   Procedure: debridement left chest wall and seroma drainage;  Surgeon: Robert Bellow, MD;  Location: [facility] ORS;  Service: General;  Laterality: Left;   MASTECTOMY Left 02/24/2021   SIMPLE MASTECTOMY WITH AXILLARY SENTINEL NODE BIOPSY Left 02/25/2021   Procedure: SIMPLE MASTECTOMY WITH AXILLARY SENTINEL NODE BIOPSY;  Surgeon: Robert Bellow, MD;  Location: [facility] ORS;  Service: General;  Laterality: Left;   TUMOR EXCISION  2001    There were no vitals filed for this visit.    Subjective Assessment - 06/03/22 0813     Subjective Patient presents to PT for evaluation s/p CVA    Patient is accompained by: Family member    Pertinent History Patient presents for therapy s/p CVA. Patient admitted to hospital on 05/19/22 for subacute ischemic stroke L posterior temporal/occipital territory and discharged 05/21/22.  Patient's daughter thinks the stroke was on May 25th. Patient fell in February during senior games and hurt both wrists. PMH includes arthritis, breast cancer, GERD, lumbar radiculitis, chemotherapy and radiation history, Not eating right now, but  has started drinking ensure. Daughter is staying with her right now.  In the house she doesn't use the walker. Prior to stroke very independent and lived alone. Patient has lost 17 lb in the past month. Patient's daughter is currently helping with showering, sometimes needs assistance with dressing.    Limitations Lifting;Standing;Walking;House hold activities    How long can you sit comfortably? n/a    How long can you stand comfortably? has not tried standing prolonged times.    How long can you walk comfortably? can walk in store with walker.    Patient Stated Goals to become independent again    Currently in Pain? No/denies                 [facility] PT Assessment - 06/03/22 0001       Assessment   Medical Diagnosis CVA    Referring Provider (PT) Lavera Guise K    Onset Date/Surgical Date 05/08/22    Hand Dominance Right    Next MD Visit Thursday      Precautions   Precautions Fall      Restrictions   Weight Bearing Restrictions No      Balance Screen   Has the patient fallen in the past 6 months Yes    How many times? 1    Has the patient had a decrease in activity level because of a fear of falling?  Yes    Is the patient reluctant to leave their home because of a fear of falling?  Yes      [place] residence    Living Arrangements Alone    Type of [place] to enter    Entrance Stairs-Number of Steps 3    Entrance Stairs-Rails Right    Home Layout One level    [place] - standard;Shower seat      Prior Function   Level of Independence Independent    Leisure senior games      Observation/Other Assessments   Focus on Therapeutic Outcomes (FOTO)  51      Standardized Balance Assessment   Standardized Balance Assessment Berg Balance Test      Berg Balance Test   Sit to Stand Able to stand without using hands and stabilize independently    Standing Unsupported Able to stand 2 minutes with supervision    Sitting with Back Unsupported but Feet Supported on Floor or Stool Able to sit safely and securely 2 minutes    Stand to Sit Sits safely with minimal use of hands    Transfers Able to transfer safely, minor use of hands    Standing Unsupported with Eyes Closed Able to stand 10 seconds with supervision    Standing Unsupported with Feet Together Needs help to attain position but able to stand for 30 seconds with feet together    From Standing, Reach Forward with Outstretched Arm Can reach forward >12 cm safely (5")    From Standing Position, Pick up Object from Floor Able to pick up shoe, needs supervision    From Standing Position, Turn  to Look Behind Over each Shoulder Looks behind one side only/other side shows less weight shift    Turn 360 Degrees Able to turn 360 degrees safely but slowly    Standing Unsupported, Alternately Place Feet on Step/Stool Able to stand independently and complete 8 steps >20 seconds    Standing Unsupported, One Foot in Front Needs  help to step but can hold 15 seconds    Standing on One Leg Unable to try or needs assist to prevent fall    Total Score 38                  PAIN: Pain in R side when taking a deep breath.   POSTURE:  WFL in standing and seated    STRENGTH:  Graded on a 0-5 scale Muscle Group Left Right  Hip Flex 4/5 4/5  Hip Abd 4/5 4/5  Hip Add 4/5 4/5  Hip Ext 4/5 4/5  Hip IR/ER 4/5 4/5  Knee Flex 4/5 4/5  Knee Ext 4/5 4/5  Ankle DF 4/5 4/5  Ankle PF 4/5 4/5   SENSATION:  BUE :  BLE :   NEUROLOGICAL SCREEN: (2+ unless otherwise noted.) N=normal  Ab=abnormal   Level Dermatome R L  C3 Anterior Neck  N N  C4 Top of Shoulder N N  C5 Lateral Upper Arm  N N  C6 Lateral Arm/ Thumb  N N  C7 Middle Finger  N N  C8 4th & 5th Finger N N  T1 Medial Arm N N  L2 Medial thigh/groin N N  L3 Lower thigh/med.knee N N  L4 Medial leg/lat thigh N N  L5 Lat. leg & dorsal foot N N  S1 post/lat foot/thigh/leg N N  S2 Post./med. thigh & leg N N    SOMATOSENSORY:  Any N & T in extremities or weakness: reports :         Sensation           Intact      Diminished         Absent  Light touch LEs                              COORDINATION:          Heel Shin Slide Test:slow speed    SPECIAL TESTS: Cranial Nerves/Vision  Visual scanning: WFL Visual fields: WFL   FUNCTIONAL MOBILITY: STS: able to perform without UE support but does have one episode of posterior LOB   BALANCE: Static Sitting Balance  Normal Able to maintain balance against maximal resistance   Good Able to maintain balance against moderate resistance x  Good-/Fair+ Accepts minimal  resistance   Fair Able to sit unsupported without balance loss and without UE support   Poor+ Able to maintain with Minimal assistance from individual or chair   Poor Unable to maintain balance-requires mod/max support from individual or chair    Static Standing Balance  Normal Able to maintain standing balance against maximal resistance   Good Able to maintain standing balance against moderate resistance   Good-/Fair+ Able to maintain standing balance against minimal resistance   Fair Able to stand unsupported without UE support and without LOB for 1-2 min x  Fair- Requires Min A and UE support to maintain standing without loss of balance   Poor+ Requires mod A and UE support to maintain standing without loss of balance   Poor Requires max A and UE support to maintain standing balance without loss    Dynamic Sitting Balance  Normal Able to sit unsupported and weight shift across midline maximally   Good Able to sit unsupported and weight shift across midline moderately   Good-/Fair+ Able to sit unsupported and weight shift across midline minimally x  Fair Minimal weight shifting ipsilateral/front, difficulty  crossing midline   Fair- Reach to ipsilateral side and unable to weight shift   Poor + Able to sit unsupported with min A and reach to ipsilateral side, unable to weight shift   Poor Able to sit unsupported with mod A and reach ipsilateral/front-can't cross midline    Standing Dynamic Balance  Normal Stand independently unsupported, able to weight shift and cross midline maximally   Good Stand independently unsupported, able to weight shift and cross midline moderately   Good-/Fair+ Stand independently unsupported, able to weight shift across midline minimally   Fair Stand independently unsupported, weight shift, and reach ipsilaterally, loss of balance when crossing midline   Poor+ Able to stand with Min A and reach ipsilaterally, unable to weight shift x  Poor Able to stand with  Mod A and minimally reach ipsilaterally, unable to cross midline.      GAIT: Patient ambulates with a RW; narrow BOS with slight steping towards R side of walker with fatigue. Decreased foot clearance bilateral with prolonged ambulation  OUTCOME MEASURES: TEST Outcome Interpretation  5 times sit<>stand 12.26 sec >60 yo, >15 sec indicates increased risk for falls  10 meter walk test       15 seconds          m/s <1.0 m/s indicates increased risk for falls; limited community ambulator      6 minute walk test        805        Feet with RW  1000 feet is community Water quality scientist 38/56 <36/56 (100% risk for falls), 37-45 (80% risk for falls); 46-51 (>50% risk for falls); 52-55 (lower risk <25% of falls)  FOTO 51 64   BP: 138/85  Oxygen: 99%  BP after 6 min walk test: 154/72       Objective measurements completed on examination: See above findings.             PT Education - 06/03/22 0919     Education Details goals, POC    Person(s) Educated Patient;Child(ren)    Methods Explanation;Demonstration;Tactile cues;Verbal cues    Comprehension Verbalized understanding;Returned demonstration;Verbal cues required;Tactile cues required              PT Short Term Goals - 06/03/22 1610       PT SHORT TERM GOAL #1   Title Patient will be independent in home exercise program to improve strength/mobility for better functional independence with ADLs.    Baseline 6/20: give HEP next session    Time 4    Period Weeks    Status New    Target Date 07/01/22               PT Long Term Goals - 06/03/22 9604       PT LONG TERM GOAL #1   Title Patient will increase FOTO score to equal to or greater than   64  to demonstrate statistically significant improvement in mobility and quality of life.    Baseline 6/20: 51%    Time 12    Period Weeks    Status New    Target Date 08/26/22      PT LONG TERM GOAL #2   Title Patient will increase Berg Balance  score by > 6 points (44/56) to demonstrate decreased fall risk during functional activities.    Baseline 6/20: 38/56    Time 12    Period Weeks    Status New    Target Date 08/26/22  PT LONG TERM GOAL #3   Title Patient will increase six minute walk test distance to >1000 for progression to community ambulator and improve gait ability    Baseline 6/20; 805 ft with RW    Time 12    Period Weeks    Status New    Target Date 08/26/22      PT LONG TERM GOAL #4   Title Patient will increase 10 meter walk test to >1.26ms with LRAD as to improve gait speed for better community ambulation and to reduce fall risk.    Baseline 6/20: 0.67 m/s with RW    Time 12    Period Weeks    Status New    Target Date 08/26/22                    Plan - 06/03/22 0919     Clinical Impression Statement Patient is a very pleasant 88year old female s/p CVA. Prior to CVA patient was very active, participating with senior games. Patient now requires use of RW and fatigues quickly. She additionally is having difficulty with eating. Patient's does have limited coordination. She is highly motivated to return to independent living and mobility. She is limited in gait speed and duration of ambulation. Patient will benefit from skilled physical therapy to increase stability, mobility, and decrease risk of falls for return to PLOF.    Personal Factors and Comorbidities Age;Comorbidity 3+;Time since onset of injury/illness/exacerbation;Transportation    Comorbidities arthritis, breast cancer, GERD, lumbar radiculitis, chemotherapy and radiation history    Examination-Activity Limitations Bathing;Caring for Others;Carry;Continence;Reach Overhead;Locomotion Level;Lift;Dressing;Squat;Stairs;Stand;Transfers    Examination-Participation Restrictions Church;Cleaning;Community Activity;Driving;Meal Prep;Laundry;Shop;Yard Work;Volunteer;Other    Stability/Clinical Decision Making Evolving/Moderate complexity     Clinical Decision Making Moderate    Rehab Potential Fair    PT Frequency 2x / week    PT Duration 12 weeks    PT Treatment/Interventions ADLs/Self Care Home Management;Aquatic Therapy;Biofeedback;Canalith Repostioning;Cryotherapy;Electrical Stimulation;Iontophoresis '4mg'$ /ml Dexamethasone;Traction;Ultrasound;DME Instruction;Gait training;Stair training;Functional mobility training;Therapeutic activities;Neuromuscular re-education;Cognitive remediation;Balance training;Therapeutic exercise;Patient/family education;Orthotic Fit/Training;Manual techniques;Passive range of motion;Dry needling;Energy conservation;Splinting;Taping;Visual/perceptual remediation/compensation;Vestibular    PT Next Visit Plan give HEP, balance, endurance strength    PT Home Exercise Plan next session    Consulted and Agree with Plan of Care Patient;Family member/caregiver    Family Member Consulted daughter             Patient will benefit from skilled therapeutic intervention in order to improve the following deficits and impairments:  Abnormal gait, Decreased activity tolerance, Decreased coordination, Decreased balance, Decreased endurance, Decreased knowledge of precautions, Decreased mobility, Decreased strength, Difficulty walking  Visit Diagnosis: Unsteadiness on feet  Muscle weakness (generalized)  Difficulty in walking, not elsewhere classified     Problem List Patient Active Problem List   Diagnosis Date Noted   Mixed urinary incontinence due to female genital prolapse 09/18/2021   Cystocele, midline 09/18/2021   Vaginal prolapse 09/18/2021   Ductal carcinoma in situ (DCIS) of left breast 03/14/2021   C[place] ([place] lymphocytic [place]) [place] 02/10/2020   Goals of care, counseling/discussion 05/11/2017   Carcinoma of upper-outer quadrant of left breast in female, estrogen receptor negative ([place] 04/08/2017   Lumbar stenosis with neurogenic claudication 01/30/2015    MJanna Arch PT,  DPT  06/03/2022, 9:25 AM  [facility] [address] [facility] [address] [place] [address]Phone: [phone]  Fax:  [phone] Name: CDEMITA TOBIAMRN: [id number] of Birth: 908-04-1934

## 2022-06-09 NOTE — Therapy (Signed)
OUTPATIENT PHYSICAL THERAPY TREATMENT NOTE   Patient Name: Megan Santana MRN: [id number] DOB:10/15/1934, 88 y.o., female Today's Date: 06/10/2022  PCP: Joen Laura MD REFERRING PROVIDER: Joen Laura MD   PT End of Session - 06/10/22 0843     Visit Number 2    Number of Visits 24    Date for PT Re-Evaluation 08/26/22    Authorization Type 2/10 eval 6/20    PT Start Time 0845    PT Stop Time 0906    PT Time Calculation (min) 21 min    Equipment Utilized During Treatment Gait belt    Activity Tolerance Patient tolerated treatment well;Patient limited by fatigue    Behavior During Therapy [facility] for tasks assessed/performed             Past Medical History:  Diagnosis Date   Arthritis    Breast cancer ([facility]) 2007   right lumpectomy   Breast cancer ([facility]) 03/31/2017   16 mm invasive mammary carcinoma, left upper outer quadrant, T1c, N0, triple negative, histologic grade 3.   Family history of adverse reaction to anesthesia    mom n/v   GERD (gastroesophageal reflux disease)    Heart murmur    Lumbar radiculitis    Lumbar stenosis    Personal history of chemotherapy 2018   Left   Personal history of radiation therapy 2007   Right   Personal history of radiation therapy 2018   Left   Past Surgical History:  Procedure Laterality Date   ABDOMINAL HYSTERECTOMY     total   BACK SURGERY     lumbar   BREAST BIOPSY Bilateral 03/31/2017   bilat u/s core INVASIVE MAMMARY CARCINOMA   BREAST BIOPSY Right 03/31/2017   DENSE FIBROSIS AND SHEETS OF MACROPHAGES   BREAST EXCISIONAL BIOPSY Left 2018   BREAST LUMPECTOMY Right 2007   BREAST LUMPECTOMY Left 04/23/2017   BREAST LUMPECTOMY Left 04/23/2017   Procedure: BREAST LUMPECTOMY WITH EXCISION OF SENTINEL NODE;  Surgeon: Earline Mayotte, MD;  Whole breast radiation ORS;  Service: General;  Laterality: Left;   BREAST SURGERY Right    Wide excision   COLONOSCOPY     EYE SURGERY     cataracts bil   FRACTURE SURGERY Left  2015 or 2016   INCISION AND DRAINAGE ABSCESS Left 03/27/2021   Procedure: debridement left chest wall and seroma drainage;  Surgeon: Earline Mayotte, MD;  Location: [facility] ORS;  Service: General;  Laterality: Left;   MASTECTOMY Left 02/24/2021   SIMPLE MASTECTOMY WITH AXILLARY SENTINEL NODE BIOPSY Left 02/25/2021   Procedure: SIMPLE MASTECTOMY WITH AXILLARY SENTINEL NODE BIOPSY;  Surgeon: Earline Mayotte, MD;  Location: [facility] ORS;  Service: General;  Laterality: Left;   TUMOR EXCISION  2001   Patient Active Problem List   Diagnosis Date Noted   Mixed urinary incontinence due to female genital prolapse 09/18/2021   Cystocele, midline 09/18/2021   Vaginal prolapse 09/18/2021   Ductal carcinoma in situ (DCIS) of left breast 03/14/2021   CLL (chronic lymphocytic leukemia) ([facility]) 02/10/2020   Goals of care, counseling/discussion 05/11/2017   Carcinoma of upper-outer quadrant of left breast in female, estrogen receptor negative ([facility]) 04/08/2017   Lumbar stenosis with neurogenic claudication 01/30/2015    REFERRING DIAG: Cerebral Infarction  THERAPY DIAG:  Muscle weakness (generalized)  Unsteadiness on feet  Difficulty in walking, not elsewhere classified  Rationale for Evaluation and Treatment Rehabilitation  PERTINENT HISTORY: Patient presents for therapy s/p CVA. Patient admitted to hospital on 05/19/22  for subacute ischemic stroke L posterior temporal/occipital territory and discharged 05/21/22.  Patient's daughter thinks the stroke was on May 25th. Patient fell in February during senior games and hurt both wrists. PMH includes arthritis, breast cancer, GERD, lumbar radiculitis, chemotherapy and radiation history, Not eating right now, but has started drinking ensure. Daughter is staying with her right now.  In the house she doesn't use the walker. Prior to stroke very independent and lived alone. Patient has lost 17 lb in the past month. Patient's daughter is currently helping with  showering, sometimes needs assistance with dressing.   PRECAUTIONS: fall  SUBJECTIVE: Patient reports having difficulty with her breathing, is not feeling the greatest this week.   PAIN:  Are you having pain? No     TODAY'S TREATMENT:   06/10/22: HR 100, SP02 99, BP 113/70; Standing 121/74  Attempted standing marches with patient, patient reports feeling like she is about to pass out, starts weaving, wobbling, chest heaving. Vitals taken [facility]. Son notified that patient needs to see physician. Son agreeable and session terminated for patient and patient's son to go to physicians office.   PATIENT EDUCATION: Education details: see physician  Person educated: Patient and Child(ren) Education method: Explanation Education comprehension: verbalized understanding   HOME EXERCISE PROGRAM: Access Code: [email or web address] URL: [email or web address] Date: 06/09/2022 Prepared by: Precious Bard  Exercises - Standing March with Counter Support  - 1 x daily - 7 x weekly - 2 sets - 10 reps - 5 hold - Seated Long Arc Quad  - 1 x daily - 7 x weekly - 2 sets - 10 reps - 5 hold - Sit to Stand  - 1 x daily - 7 x weekly - 2 sets - 10 reps - 5 hold - Single Leg Stance with Support  - 1 x daily - 7 x weekly - 2 sets - 2 reps - 30 hold   PT Short Term Goals       PT SHORT TERM GOAL #1   Title Patient will be independent in home exercise program to improve strength/mobility for better functional independence with ADLs.    Baseline 6/20: give HEP next session    Time 4    Period Weeks    Status New    Target Date 07/01/22              PT Long Term Goals      PT LONG TERM GOAL #1   Title Patient will increase FOTO score to equal to or greater than   64  to demonstrate statistically significant improvement in mobility and quality of life.    Baseline 6/20: 51%    Time 12    Period Weeks    Status New    Target Date 08/26/22      PT LONG TERM GOAL #2   Title Patient will  increase Berg Balance score by > 6 points (44/56) to demonstrate decreased fall risk during functional activities.    Baseline 6/20: 38/56    Time 12    Period Weeks    Status New    Target Date 08/26/22      PT LONG TERM GOAL #3   Title Patient will increase six minute walk test distance to >1000 for progression to community ambulator and improve gait ability    Baseline 6/20; 805 ft with RW    Time 12    Period Weeks    Status New    Target Date 08/26/22  PT LONG TERM GOAL #4   Title Patient will increase 10 meter walk test to >1.40m/s with LRAD as to improve gait speed for better community ambulation and to reduce fall risk.    Baseline 6/20: 0.67 m/s with RW    Time 12    Period Weeks    Status New    Target Date 08/26/22              Plan     Clinical Impression Statement Attempted standing marches with patient, patient reports feeling like she is about to pass out, starts weaving, wobbling, chest heaving. Vitals taken [facility]. Son notified that patient needs to see physician. Son agreeable and session terminated for patient and patient's son to go to physicians office.    Personal Factors and Comorbidities Age;Comorbidity 3+;Time since onset of injury/illness/exacerbation;Transportation    Comorbidities arthritis, breast cancer, GERD, lumbar radiculitis, chemotherapy and radiation history    Examination-Activity Limitations Bathing;Caring for Others;Carry;Continence;Reach Overhead;Locomotion Level;Lift;Dressing;Squat;Stairs;Stand;Transfers    Examination-Participation Restrictions Church;Cleaning;Community Activity;Driving;Meal Prep;Laundry;Shop;Yard Work;Volunteer;Other    Stability/Clinical Decision Making Evolving/Moderate complexity    Rehab Potential Fair    PT Frequency 2x / week    PT Duration 12 weeks    PT Treatment/Interventions ADLs/Self Care Home Management;Aquatic Therapy;Biofeedback;Canalith Repostioning;Cryotherapy;Electrical Stimulation;Iontophoresis  4mg /ml Dexamethasone;Traction;Ultrasound;DME Instruction;Gait training;Stair training;Functional mobility training;Therapeutic activities;Neuromuscular re-education;Cognitive remediation;Balance training;Therapeutic exercise;Patient/family education;Orthotic Fit/Training;Manual techniques;Passive range of motion;Dry needling;Energy conservation;Splinting;Taping;Visual/perceptual remediation/compensation;Vestibular    PT Next Visit Plan give HEP, balance, endurance strength    PT Home Exercise Plan next session    Consulted and Agree with Plan of Care Patient;Family member/caregiver    Family Member Consulted daughter              Precious Bard, [place], DPT  06/10/2022, 9:07 AM

## 2022-06-10 ENCOUNTER — Ambulatory Visit: Payer: Medicare HMO

## 2022-06-10 DIAGNOSIS — R262 Difficulty in walking, not elsewhere classified: Secondary | ICD-10-CM

## 2022-06-10 DIAGNOSIS — R2681 Unsteadiness on feet: Secondary | ICD-10-CM

## 2022-06-10 DIAGNOSIS — M6281 Muscle weakness (generalized): Secondary | ICD-10-CM

## 2022-06-12 ENCOUNTER — Encounter: Payer: Medicare HMO | Admitting: Occupational Therapy

## 2022-06-16 ENCOUNTER — Ambulatory Visit: Payer: Medicare HMO

## 2022-06-19 ENCOUNTER — Ambulatory Visit: Payer: Medicare HMO

## 2022-06-19 ENCOUNTER — Telehealth: Payer: Self-pay | Admitting: Oncology

## 2022-06-19 NOTE — Telephone Encounter (Signed)
Pt daughter [name] called to report that her mother has been admitted to [facility] and will be transferring to hospice services. She would like all future appointments canceled along with the scans associated with them.

## 2022-06-20 ENCOUNTER — Telehealth: Payer: Self-pay | Admitting: Oncology

## 2022-06-20 NOTE — Telephone Encounter (Signed)
pt daughter called and. She is wanting to know how she can go about cancelling the grant payments they are receiving. I informed pt that I would get information for her and someone would give her a call back. She states that is fine and if she does not answer to lvm

## 2022-06-23 ENCOUNTER — Ambulatory Visit: Payer: Medicare HMO

## 2022-06-30 ENCOUNTER — Ambulatory Visit: Payer: Medicare HMO | Admitting: Urology

## 2022-07-01 ENCOUNTER — Encounter: Payer: Medicare HMO | Admitting: Occupational Therapy

## 2022-07-01 ENCOUNTER — Ambulatory Visit: Payer: Medicare HMO

## 2022-07-03 ENCOUNTER — Encounter: Payer: Medicare HMO | Admitting: Occupational Therapy

## 2022-07-07 ENCOUNTER — Ambulatory Visit: Payer: Medicare HMO

## 2022-07-10 ENCOUNTER — Encounter: Payer: Medicare HMO | Admitting: Occupational Therapy

## 2022-07-11 ENCOUNTER — Other Ambulatory Visit (HOSPITAL_COMMUNITY): Payer: Self-pay

## 2022-07-11 NOTE — Telephone Encounter (Signed)
Called [employer] ([employer] assistance) and inactivated patient.

## 2022-07-15 ENCOUNTER — Ambulatory Visit: Payer: Medicare HMO

## 2022-07-15 ENCOUNTER — Encounter: Payer: Medicare HMO | Admitting: Occupational Therapy

## 2022-07-15 DEATH — deceased

## 2022-07-17 ENCOUNTER — Encounter: Payer: Medicare HMO | Admitting: Occupational Therapy

## 2022-07-21 ENCOUNTER — Ambulatory Visit: Payer: Medicare HMO

## 2022-07-21 ENCOUNTER — Encounter: Payer: Medicare HMO | Admitting: Occupational Therapy

## 2022-07-24 ENCOUNTER — Ambulatory Visit: Payer: Medicare HMO

## 2022-07-28 ENCOUNTER — Other Ambulatory Visit: Payer: Medicare HMO

## 2022-07-29 ENCOUNTER — Encounter: Payer: Medicare HMO | Admitting: Occupational Therapy

## 2022-07-31 ENCOUNTER — Ambulatory Visit: Payer: Medicare HMO

## 2022-07-31 ENCOUNTER — Ambulatory Visit: Payer: Medicare HMO | Admitting: Oncology

## 2022-08-05 ENCOUNTER — Encounter: Payer: Medicare HMO | Admitting: Occupational Therapy

## 2022-08-05 ENCOUNTER — Ambulatory Visit: Payer: Medicare HMO

## 2022-08-07 ENCOUNTER — Ambulatory Visit: Payer: Medicare HMO

## 2022-08-07 ENCOUNTER — Encounter: Payer: Medicare HMO | Admitting: Occupational Therapy

## 2022-08-12 ENCOUNTER — Ambulatory Visit: Payer: Medicare HMO

## 2022-08-12 ENCOUNTER — Other Ambulatory Visit (HOSPITAL_COMMUNITY): Payer: Self-pay

## 2022-08-12 ENCOUNTER — Encounter: Payer: Medicare HMO | Admitting: Occupational Therapy

## 2022-08-14 ENCOUNTER — Ambulatory Visit: Payer: Medicare HMO

## 2022-08-14 ENCOUNTER — Other Ambulatory Visit (HOSPITAL_COMMUNITY): Payer: Self-pay

## 2022-08-25 ENCOUNTER — Ambulatory Visit: Payer: Medicare HMO | Admitting: Obstetrics and Gynecology

## 2022-09-10 ENCOUNTER — Other Ambulatory Visit (HOSPITAL_COMMUNITY): Payer: Self-pay
